# Patient Record
Sex: Female | Born: 1940 | Race: White | Hispanic: No | Marital: Single | Smoking: Never smoker
Health system: Southern US, Community
[De-identification: ages and names within clinical notes are randomized; demographics above are authoritative.]

## PROBLEM LIST (undated history)

## (undated) DIAGNOSIS — G4730 Sleep apnea, unspecified: Secondary | ICD-10-CM

## (undated) DIAGNOSIS — N189 Chronic kidney disease, unspecified: Secondary | ICD-10-CM

## (undated) DIAGNOSIS — E785 Hyperlipidemia, unspecified: Secondary | ICD-10-CM

## (undated) DIAGNOSIS — K219 Gastro-esophageal reflux disease without esophagitis: Secondary | ICD-10-CM

## (undated) DIAGNOSIS — C801 Malignant (primary) neoplasm, unspecified: Secondary | ICD-10-CM

## (undated) DIAGNOSIS — I639 Cerebral infarction, unspecified: Secondary | ICD-10-CM

## (undated) DIAGNOSIS — R7303 Prediabetes: Secondary | ICD-10-CM

## (undated) DIAGNOSIS — I10 Essential (primary) hypertension: Secondary | ICD-10-CM

## (undated) DIAGNOSIS — H269 Unspecified cataract: Secondary | ICD-10-CM

## (undated) DIAGNOSIS — T7840XA Allergy, unspecified, initial encounter: Secondary | ICD-10-CM

## (undated) HISTORY — DX: Unspecified cataract: H26.9

## (undated) HISTORY — DX: Hyperlipidemia, unspecified: E78.5

## (undated) HISTORY — DX: Malignant (primary) neoplasm, unspecified: C80.1

## (undated) HISTORY — PX: BREAST SURGERY: SHX581

## (undated) HISTORY — DX: Gastro-esophageal reflux disease without esophagitis: K21.9

## (undated) HISTORY — DX: Cerebral infarction, unspecified: I63.9

## (undated) HISTORY — PX: SPINE SURGERY: SHX786

## (undated) HISTORY — DX: Essential (primary) hypertension: I10

## (undated) HISTORY — DX: Allergy, unspecified, initial encounter: T78.40XA

## (undated) HISTORY — PX: DILATION AND CURETTAGE OF UTERUS: SHX78

---
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
## 1963-10-24 HISTORY — PX: OTHER SURGICAL HISTORY: SHX169

## 1997-10-23 HISTORY — PX: EYE SURGERY: SHX253

## 1998-05-04 ENCOUNTER — Other Ambulatory Visit: Admission: RE | Admit: 1998-05-04 | Discharge: 1998-05-04 | Payer: Self-pay | Admitting: Obstetrics and Gynecology

## 1998-05-11 ENCOUNTER — Other Ambulatory Visit: Admission: RE | Admit: 1998-05-11 | Discharge: 1998-05-11 | Payer: Self-pay | Admitting: Obstetrics and Gynecology

## 1999-05-06 ENCOUNTER — Other Ambulatory Visit: Admission: RE | Admit: 1999-05-06 | Discharge: 1999-05-06 | Payer: Self-pay | Admitting: Obstetrics and Gynecology

## 2000-05-09 ENCOUNTER — Other Ambulatory Visit: Admission: RE | Admit: 2000-05-09 | Discharge: 2000-05-09 | Payer: Self-pay | Admitting: Obstetrics and Gynecology

## 2000-11-05 ENCOUNTER — Encounter (INDEPENDENT_AMBULATORY_CARE_PROVIDER_SITE_OTHER): Payer: Self-pay

## 2000-11-05 ENCOUNTER — Ambulatory Visit (HOSPITAL_COMMUNITY): Admission: RE | Admit: 2000-11-05 | Discharge: 2000-11-05 | Payer: Self-pay | Admitting: Gastroenterology

## 2001-06-28 ENCOUNTER — Other Ambulatory Visit: Admission: RE | Admit: 2001-06-28 | Discharge: 2001-06-28 | Payer: Self-pay | Admitting: Obstetrics and Gynecology

## 2002-08-01 ENCOUNTER — Other Ambulatory Visit: Admission: RE | Admit: 2002-08-01 | Discharge: 2002-08-01 | Payer: Self-pay | Admitting: Obstetrics and Gynecology

## 2003-09-11 ENCOUNTER — Other Ambulatory Visit: Admission: RE | Admit: 2003-09-11 | Discharge: 2003-09-11 | Payer: Self-pay | Admitting: Obstetrics and Gynecology

## 2004-09-13 ENCOUNTER — Other Ambulatory Visit: Admission: RE | Admit: 2004-09-13 | Discharge: 2004-09-13 | Payer: Self-pay | Admitting: Obstetrics and Gynecology

## 2005-10-23 HISTORY — PX: BACK SURGERY: SHX140

## 2006-01-03 ENCOUNTER — Other Ambulatory Visit: Admission: RE | Admit: 2006-01-03 | Discharge: 2006-01-03 | Payer: Self-pay | Admitting: Obstetrics and Gynecology

## 2006-01-05 ENCOUNTER — Inpatient Hospital Stay (HOSPITAL_COMMUNITY): Admission: RE | Admit: 2006-01-05 | Discharge: 2006-01-06 | Payer: Self-pay | Admitting: Neurosurgery

## 2010-05-16 ENCOUNTER — Encounter: Admission: RE | Admit: 2010-05-16 | Discharge: 2010-07-22 | Payer: Self-pay | Admitting: Emergency Medicine

## 2011-03-10 NOTE — Procedures (Signed)
[facility]  Patient:    Tracey Richard, Tracey Richard              MRN: [id number] Proc. Date: 11/05/00 Adm. Date:  [id number] Attending:  Nelda Marseille CC:         Helene Kelp, M.D.  Daniel L. Eda Paschal, M.D.   Procedure Report  PROCEDURE:  Colonoscopy with biopsy.  INDICATIONS:  Patient with bright red per rectum, due for colonic screening. Also with some increased mucus of late after being started on a new nonsteroidal.  INFORMED CONSENT:  Consent was signed after risk, benefits, methods and options were thoroughly discussed in the office.  MEDICINES USED:  Demerol 60 mg, Versed 6 mg.  DESCRIPTION OF PROCEDURE:  Rectal inspection was pertinent for external hemorrhoids.  Digital exam was negative.  The video colonoscope was inserted. Some proximal rectal distal sigmoid inflammation was seen.  The scope was advanced around the colon to the cecum which did require rolling her on her back and some abdominal pressure.  No obvious other abnormalities were seen as we advanced to the cecum.  The cecum was pertinent for some periappendiceal inflammation which was cold biopsied.  The scope was inserted a short ways in the terminal ileum which was normal.  Photodocumentation was obtained.  The scope was slowly withdrawn.  Prep was fairly adequate.  It did require lots of washing and suctioning to clear lots of liquid stool but on slow withdrawal through the colon, the rest of the cecum, ascending, transverse, and descending were all normal.  In the distal sigmoid, a small questionable 1-2 mm polyp was seen just about where the inflammation and tiny erosions were seen.  Multiple biopsies were obtained.  No obvious mass, lesion, or significant ulceration was seen.  Once back in the rectum, the scope was retroflexed.  The distal rectum was pertinent for internal hemorrhoids, but being normal.  The proximal rectum was involved and inflamed, as above.   The scope was reinserted a short ways up the sigmoid.  Air was suctioned and the scope removed.  The patient tolerated the procedure well, and there was no obvious immediate complication.  ENDOSCOPIC DIAGNOSES: 1. Internal/external hemorrhoids. 2. Inflamed proximal rectum, distal sigmoid, status post biopsy, very short    segment. 3. One tiny distal sigmoid polyp, status post biopsy. 4. Periappendiceal inflammation, status post biopsy. 5. Otherwise within normal limits to the terminal ileum.  PLAN:  Await pathology to see if this is Crohns or a nonsteroidal cause. Will use suppositories with HC, hold Canasa suppositories for now because of her sulfa allergy.  Follow up p.r.n. or in 6-8 weeks to recheck symptoms and make sure no further work-up plans are needed. DD:  11/05/00 TD:  11/05/00 Job: [id number]

## 2011-03-10 NOTE — Op Note (Signed)
NAMEVICKEY, BOAK            ACCOUNT NO.:  [id number]   MEDICAL RECORD NO.:  [id number]          PATIENT TYPE:  INP   LOCATION:  2899                         FACILITY:  [facility]   PHYSICIAN:  Kathaleen Maser. Pool, M.D.    DATE OF BIRTH:  [birth year]   DATE OF PROCEDURE:  01/05/2006  DATE OF DISCHARGE:                                 OPERATIVE REPORT   SERVICE:  Neurosurgery.   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   PROCEDURE:  1.  L4-5 decompressive laminectomy with foraminotomies, more than would be      required for simple interbody fusion along.  2.  L4-5 posterolateral interbody fusion utilizing TANGENT allograft wedge,      Telamon PEEK interbody cage and local autografting.  3.  Posterolateral arthrodesis utilizing non-segmental pedicle screw      fixation and local autografting.   SURGEON:  Kathaleen Maser. Pool, M.D.   ASSISTANT:  Donalee Citrin, M.D.   ANESTHESIA:  General endotracheal anesthesia.   INDICATIONS:  Ms. Gatchell is a 70 year old female with a history of  severe back and lower extremities pain, failing conservative management.  Workup demonstrates evidence of a degenerative spondylolisthesis at L4-5  with coexistent marked lateral listhesis as well.  The patient has severe  stenosis at this level.  We have discussed options of her management  including the possibility of undergoing an L4-5 decompression and fusion and  instrument.  The patient is aware of the risks and benefits and wishes to  proceed.   OPERATIVE NOTE:  The patient was placed on the operating room table in a  supine position.  After an adequate level of anesthesia was achieved, the  patient was positioned prone onto a Wilson frame and appropriately padded.  The patient's lumbar region was prepped and draped sterilely.  A 10 blade  was used to make a linear skin incision overlying the  L3, 4 and 5 levels.  This was carried down sharply in the midline.  Subperiosteal dissection was  performed to expose the lamina and facet joints at L4 and L5.  Deep self-  retainers were placed.  Intraoperative fluoroscopy was used and level was  confirmed.  The transverse process at L4 and L5 were also dissected free.  A  decompressive laminectomy was then performed using Leksell rongeur, Kerrison  rongeurs and a high-speed drill to remove the entire lamina of L4, entire  inferior facet of L4, entire superior facet of L5 and the superior aspect of  the lamina of L5.  All bone was cleaned and used in later autografting.  The  ligamentum flavum was elevated and resected in piecemeal fashion using  Kerrison rongeurs.  On the thecal sac, existing L4 and L5 nerve root were  identified.  A wide decompressive foraminotomy was then performed, exposing  the existing nerve roots bilaterally.  The epidural venous plexus was  coagulated and cut.  The thecal sac and nerve roots were mobilized and  retracted towards the midline starting first on the patient's right side.  The disk space was  incised with a 15 blade in a triangular fashion and a  wide disk space cleanout was achieved using pituitary rongeurs, upbiting and  downbiting pituitary rongeurs and Epstein curettes.  The procedure was then  repeated on the contralateral side.  The disk space was then distracted up  to 11 mm with an 11-mm distractor left in the patient's left side.  The  thecal sac and nerve roots were protracted on the right side.  The disk  space was then reamed and then cut with 10-mm TANGENT instrument.  Soft  tissues were removed from the interspace.  A 10 x 26-mm Telamon cage packed  with autograft was then impacted into place and recessed approximately 2 mm  from the posterior cortical margin.  Distraction was to the patient's left  side.  The thecal sac and nerve roots were protected on the left side.  The  disk space was  then reamed and then cut with a 10-mm TANGENT instrument.  Soft tissues were removed from the interspace.  Morcelized autograft was  then packed into the interspace.  A 10 x 26-mm TANGENT allograft wedge was  then impacted into place and recessed approximately 2 mm posterior to the  cortical margin.  The pedicles at L4 and L5 were then identified superficial  line marks.  Superficial bone involving the pedicle was then removed using a  high-speed drill.  Each pedicle was then probed using a pedicle awl.  The  pedicle awl tract was then probed and found to solid in bone.  Each pedicle  awl tract was then tapped with a 5.25-mm screw tap.  Each screw tap hole was  probed and found to be solid within bone.  A 6.75 x 45-mm spiral 90-D screws  were placed bilaterally at L4 and 6.75 x 40-mm screws placed bilaterally at  L5.  The transverse processes at L4 and L5 were then decorticated using a  high-speed drill.  Morcelized allograft was packed posterolaterally for  later fusion.  Short-segment titanium rods were then placed to screw head at  L4 and L5.  Locking caps were then placed over the screw heads.  The locking  caps were then engaged with the construct under compression.  Final images  revealed good position of bone graft at the proper operative level and  normal alignment of the spine.  The wound was then irrigated with antibiotic  solution.  Gelfoam was placed topically and hemostasis found to be good.  Retraction system was removed.  Hemostasis in the muscle was achieved with  electrocautery.  The wound was then closed in layers with Vicryl sutures.  Steri-Strip series were applied.  There were no apparent complication.  The  patient was awakened and she was returned to the recovery room postop.           ______________________________  Kathaleen Maser. Pool, M.D.     HAP/MEDQ  D:  01/05/2006  T:  01/06/2006  Job:  [id number]

## 2011-05-19 ENCOUNTER — Ambulatory Visit (INDEPENDENT_AMBULATORY_CARE_PROVIDER_SITE_OTHER): Payer: Self-pay | Admitting: Surgery

## 2011-06-21 ENCOUNTER — Ambulatory Visit (INDEPENDENT_AMBULATORY_CARE_PROVIDER_SITE_OTHER): Payer: Medicare Other | Admitting: Surgery

## 2011-06-21 ENCOUNTER — Encounter (INDEPENDENT_AMBULATORY_CARE_PROVIDER_SITE_OTHER): Payer: Self-pay | Admitting: Surgery

## 2011-06-21 VITALS — BP 127/85 | HR 66

## 2011-06-21 DIAGNOSIS — N62 Hypertrophy of breast: Secondary | ICD-10-CM

## 2011-06-21 DIAGNOSIS — N6099 Unspecified benign mammary dysplasia of unspecified breast: Secondary | ICD-10-CM

## 2011-06-21 NOTE — Progress Notes (Signed)
Chief Complaint  Patient presents with  . Breast Cancer Long Term Follow Up    Tracey Richard is a 70 y.o. (DOB: 06/12/1941)  white female who is a patient of DAUB,STEVE A, MD and comes to me today for breast exam.  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 Judithe Modest. She was followed by Dr. Paulla Fore until his retirement and has since been seeing me. Her last visit to our office was October 2008. She was surprised as been that long.  She has a mammogram dated 16 Mar 2011 from [place] that was negative. She has noticed no new mass or lump in her breast. She retired about one year ago. She successfully lost about 30 pounds of weight.  She is helping her niece who is on hemodialysis.  PHYSICAL EXAM: BP 127/85  Pulse 66  HEENT:  Pupils equal.  Dentition good.  No injury. NECK:  Supple.  No thyroid mass. LYMPH NODES:  No cervical, supraclavicular, or axillary adenopathy. BREASTS -  RIGHT:  No palpable mass or nodule.  No nipple discharge.  Scar lateral right breast   LEFT:  No palpable mass or nodule.  No nipple discharge. UPPER EXTREMITIES:  No evidence of lymphedema.  DATA REVIEWED: [facility] mammogram 03/16/2011.  ASSESSMENT and PLAN: 1.  Atypical hyperplasia of right breast.  Now 27 years out from her original biopsy.  I discussed her followup with me.  She sees Dr. Ellis Parents for a primary care doctor.  She will follow with him for her breast care.  I made this her last visit with me unless she has a problem.

## 2011-06-21 NOTE — Patient Instructions (Signed)
Will follow Dr. Ellis Parents for your care.

## 2011-09-07 ENCOUNTER — Telehealth: Payer: Self-pay | Admitting: Family Medicine

## 2011-09-11 NOTE — Telephone Encounter (Signed)
closed

## 2012-01-16 ENCOUNTER — Ambulatory Visit: Payer: Self-pay | Admitting: Emergency Medicine

## 2012-01-19 ENCOUNTER — Ambulatory Visit (INDEPENDENT_AMBULATORY_CARE_PROVIDER_SITE_OTHER): Payer: Medicare Other | Admitting: Family Medicine

## 2012-01-19 DIAGNOSIS — I10 Essential (primary) hypertension: Secondary | ICD-10-CM

## 2012-01-19 DIAGNOSIS — J301 Allergic rhinitis due to pollen: Secondary | ICD-10-CM

## 2012-01-19 MED ORDER — VALSARTAN 80 MG PO TABS: 40.0000 mg | ORAL_TABLET | Freq: Every day | ORAL | Status: DC

## 2012-01-19 MED ORDER — FLUTICASONE PROPIONATE 50 MCG/ACT NA SUSP: 2.0000 | Freq: Every day | NASAL | Status: DC

## 2012-01-19 NOTE — Progress Notes (Signed)
  Patient Name: Tracey Richard Date of Birth: 25-Dec-1940 Medical Record Number: [id number] Gender: female Date of Encounter: 01/19/2012  History of Present Illness:  Tracey Richard is a 71 y.o. very pleasant female patient who presents with the following:  Has noted a scratchy throat for a couple of weeks- usually better with a cough drop and ibuprofen.  Ears feel itchy and "popping" for a week or so.  Also this am noted hoarse voice.  Some cough, mild runny nose.  No sneezing, no itchy or watery eyes.  She did have a temp of 100.6 last week- since then no fever.  However, one day last week and yesterday she felt nauseated and vomited after drinking her coffee.  No vomiting today, no diarrhea.  No unusual stomach pain- she does have a history of reflux and a hiatal hernia.    Patient Active Problem List  Diagnoses  . Atypical hyperplasia of breast, right   Past Medical History  Diagnosis Date  . Cancer   . GERD (gastroesophageal reflux disease)   . Hyperlipidemia   . Hypertension    Past Surgical History  Procedure Date  . Tonsillectomy   . Back surgery   . Appendectomy   . Moles   . Breast surgery    History  Substance Use Topics  . Smoking status: Never Smoker   . Smokeless tobacco: Never Used  . Alcohol Use: No   No family history on file. Allergies  Allergen Reactions  . Penicillins   . Sulfur     Medication list has been reviewed and updated.  Review of Systems: As per HPI- otherwise negative.   Physical Examination: Filed Vitals:   01/19/12 1549  BP: 113/68  Pulse: 85  Temp: 98.5 F (36.9 C)  TempSrc: Oral  Resp: 16  Height: 5\' 10"  (1.778 m)  Weight: 156 lb 6.4 oz (70.943 kg)    Body mass index is 22.44 kg/(m^2).  GEN: WDWN, NAD, Non-toxic, A & O x 3 HEENT: Atraumatic, Normocephalic. Neck supple. No masses, No LAD.   Tm, oropharynx wnl.  Nasal cavity consistent with AR Ears and Nose: No external deformity. CV: RRR, No M/G/R. No JVD. No  thrill. No extra heart sounds. PULM: CTA B, no wheezes, crackles, rhonchi. No retractions. No resp. distress. No accessory muscle use. ABD: S, NT, ND, +BS. No rebound. No HSM. EXTR: No c/c/e NEURO Normal gait.  PSYCH: Normally interactive. Conversant. Not depressed or anxious appearing.  Calm demeanor.    Assessment and Plan: 1. Allergic rhinitis  fluticasone (FLONASE) 50 MCG/ACT nasal spray  2. Hypertension  valsartan (DIOVAN) 80 MG tablet   Refilled htn meds.  Suspect symptoms due to allergies- try flonase as above.  If not better please call

## 2012-02-20 ENCOUNTER — Other Ambulatory Visit: Payer: Self-pay

## 2012-02-20 ENCOUNTER — Encounter: Payer: Self-pay | Admitting: Emergency Medicine

## 2012-02-20 ENCOUNTER — Ambulatory Visit: Payer: Medicare Other

## 2012-02-20 ENCOUNTER — Ambulatory Visit (INDEPENDENT_AMBULATORY_CARE_PROVIDER_SITE_OTHER): Payer: Medicare Other | Admitting: Emergency Medicine

## 2012-02-20 VITALS — BP 122/77 | HR 78 | Temp 98.6°F | Resp 16 | Ht 63.5 in | Wt 158.0 lb

## 2012-02-20 DIAGNOSIS — M545 Low back pain: Secondary | ICD-10-CM

## 2012-02-20 DIAGNOSIS — R1084 Generalized abdominal pain: Secondary | ICD-10-CM

## 2012-02-20 DIAGNOSIS — R319 Hematuria, unspecified: Secondary | ICD-10-CM

## 2012-02-20 DIAGNOSIS — M549 Dorsalgia, unspecified: Secondary | ICD-10-CM

## 2012-02-20 DIAGNOSIS — Z0000 Encounter for general adult medical examination without abnormal findings: Secondary | ICD-10-CM

## 2012-02-20 LAB — CBC WITH DIFFERENTIAL/PLATELET
Basophils Absolute: 0 10*3/uL (ref 0.0–0.1)
Basophils Relative: 0 % (ref 0–1)
Eosinophils Absolute: 0.2 10*3/uL (ref 0.0–0.7)
Eosinophils Relative: 3 % (ref 0–5)
Hemoglobin: 12.4 g/dL (ref 12.0–15.0)
Lymphocytes Relative: 27 % (ref 12–46)
Lymphs Abs: 1.9 10*3/uL (ref 0.7–4.0)
MCHC: 32.5 g/dL (ref 30.0–36.0)
Monocytes Relative: 9 % (ref 3–12)
WBC: 7.2 10*3/uL (ref 4.0–10.5)

## 2012-02-20 LAB — COMPREHENSIVE METABOLIC PANEL
Alkaline Phosphatase: 59 U/L (ref 39–117)
Chloride: 106 mEq/L (ref 96–112)
Creat: 0.95 mg/dL (ref 0.50–1.10)
Glucose, Bld: 84 mg/dL (ref 70–99)
Sodium: 142 mEq/L (ref 135–145)
Total Protein: 6.5 g/dL (ref 6.0–8.3)

## 2012-02-20 LAB — POCT UA - MICROSCOPIC ONLY: Casts, Ur, LPF, POC: NEGATIVE

## 2012-02-20 LAB — LIPID PANEL
Cholesterol: 171 mg/dL (ref 0–200)
HDL: 52 mg/dL (ref >39)
LDL Cholesterol: 105 mg/dL — ABNORMAL HIGH (ref 0–99)
VLDL: 14 mg/dL (ref 0–40)

## 2012-02-20 LAB — POCT URINALYSIS DIPSTICK
Leukocytes, UA: NEGATIVE
Protein, UA: NEGATIVE

## 2012-02-20 LAB — TSH: TSH: 1.995 u[IU]/mL (ref 0.350–4.500)

## 2012-02-20 NOTE — Progress Notes (Signed)
  Subjective:    Patient ID: Tracey Richard, female    DOB: [birth year], 71 y.o.   MRN: [id number]  HPI patient here for her yearly physical exam. She is followed here for high cholesterol and high blood pressure. She is doing very well she has minimal complaints at this time.    Review of Systems  Constitutional: Negative.   HENT: Negative.   Eyes: Negative.   Respiratory: Negative.   Cardiovascular: Negative.   Gastrointestinal:       She has made an appointment to see Dr. Ewing Schlein for a recent change in bowel habits.  Genitourinary:       History microscopic hematuria. She's been to Dr. Lorretta Harp in the past but not recently. She has not noticed any symptoms .  Musculoskeletal:       Patient is status post back surgery she has chronic intermittent mid back pain  Neurological: Negative.   Hematological: Negative.   Psychiatric/Behavioral: Negative.        Objective:   Physical Exam  Constitutional: She is oriented to person, place, and time. She appears well-developed and well-nourished.  HENT:  Head: Normocephalic and atraumatic.  Eyes: Pupils are equal, round, and reactive to light.  Neck: Normal range of motion. No JVD present. No tracheal deviation present. No thyromegaly present.  Cardiovascular: Normal rate, regular rhythm and intact distal pulses.  Exam reveals no gallop and no friction rub.   No murmur heard. Pulmonary/Chest: Effort normal and breath sounds normal. No respiratory distress. She has no wheezes. She has no rales. She exhibits no tenderness.  Abdominal: Soft. She exhibits no distension and no mass. There is no tenderness. There is no rebound and no guarding.  Lymphadenopathy:    She has no cervical adenopathy.  Neurological: She is alert and oriented to person, place, and time. No cranial nerve deficit.  Skin: Skin is warm and dry.  Psychiatric: She has a normal mood and affect. Her behavior is normal.  [facility] reading (PRIMARY) by  Dr.Reyana Leisey back films show  hardware present. There appears to be a spondylolisthesis above the hardware placement . Results for orders placed in visit on 02/20/12  POCT UA - MICROSCOPIC ONLY      Component Value Range   WBC, Ur, HPF, POC 0-1     RBC, urine, microscopic 2-8     Bacteria, U Microscopic neg     Mucus, UA trace     Epithelial cells, urine per micros 0-3     Crystals, Ur, HPF, POC neg     Casts, Ur, LPF, POC neg     Yeast, UA neg    POCT URINALYSIS DIPSTICK      Component Value Range   Color, UA yellow     Clarity, UA clear     Glucose, UA neg     Bilirubin, UA small     Ketones, UA trace     Spec Grav, UA 1.025     Blood, UA moderate     pH, UA 5.5     Protein, UA neg     Urobilinogen, UA 0.2     Nitrite, UA neg     Leukocytes, UA Negative    .  EKG is normal        Assessment & Plan:  No change in medications at present. Referral made to urology for evaluation.

## 2012-02-24 ENCOUNTER — Telehealth: Payer: Self-pay

## 2012-02-24 NOTE — Telephone Encounter (Signed)
Pt would like to know lab results

## 2012-02-26 ENCOUNTER — Telehealth: Payer: Self-pay

## 2012-02-26 NOTE — Telephone Encounter (Signed)
Pt is looking for lab results  Please call [phone]

## 2012-02-27 NOTE — Telephone Encounter (Signed)
NOTIFIED PT OF NORMAL LABS.

## 2012-05-14 ENCOUNTER — Encounter: Payer: Self-pay | Admitting: Emergency Medicine

## 2012-06-14 ENCOUNTER — Encounter: Payer: Self-pay | Admitting: Emergency Medicine

## 2012-06-18 ENCOUNTER — Ambulatory Visit: Payer: Medicare Other

## 2012-06-18 ENCOUNTER — Ambulatory Visit (INDEPENDENT_AMBULATORY_CARE_PROVIDER_SITE_OTHER): Payer: Medicare Other | Admitting: Emergency Medicine

## 2012-06-18 VITALS — BP 141/81 | HR 73 | Temp 97.8°F | Resp 18 | Ht 63.5 in | Wt 159.0 lb

## 2012-06-18 DIAGNOSIS — Z9109 Other allergy status, other than to drugs and biological substances: Secondary | ICD-10-CM

## 2012-06-18 DIAGNOSIS — G4700 Insomnia, unspecified: Secondary | ICD-10-CM

## 2012-06-18 DIAGNOSIS — R7309 Other abnormal glucose: Secondary | ICD-10-CM

## 2012-06-18 DIAGNOSIS — R319 Hematuria, unspecified: Secondary | ICD-10-CM

## 2012-06-18 DIAGNOSIS — R6889 Other general symptoms and signs: Secondary | ICD-10-CM

## 2012-06-18 DIAGNOSIS — I10 Essential (primary) hypertension: Secondary | ICD-10-CM

## 2012-06-18 DIAGNOSIS — E119 Type 2 diabetes mellitus without complications: Secondary | ICD-10-CM

## 2012-06-18 DIAGNOSIS — R9389 Abnormal findings on diagnostic imaging of other specified body structures: Secondary | ICD-10-CM

## 2012-06-18 LAB — POCT GLYCOSYLATED HEMOGLOBIN (HGB A1C): Hemoglobin A1C: 5.9

## 2012-06-18 LAB — GLUCOSE, POCT (MANUAL RESULT ENTRY): POC Glucose: 96 mg/dl (ref 70–99)

## 2012-06-18 MED ORDER — IMIPRAMINE PAMOATE 75 MG PO CAPS: 75.0000 mg | ORAL_CAPSULE | Freq: Every day | ORAL | Status: DC

## 2012-06-18 MED ORDER — VALSARTAN 80 MG PO TABS: ORAL_TABLET | ORAL | Status: DC

## 2012-06-18 MED ORDER — OMEPRAZOLE 40 MG PO CPDR: 40.0000 mg | DELAYED_RELEASE_CAPSULE | Freq: Every day | ORAL | Status: DC

## 2012-06-18 MED ORDER — FLUTICASONE PROPIONATE 50 MCG/ACT NA SUSP: 2.0000 | Freq: Every day | NASAL | Status: DC

## 2012-06-18 NOTE — Progress Notes (Signed)
  Subjective:    Patient ID: Tracey Richard, female    DOB: [birth year], 71 y.o.   MRN: [id number]  HPI [name] is in the followup of her hematuria. She went to the nephrologist and urologist. She is due to have a followup ultrasound in a year. She is due to see Dr. Hyman Hopes in a year. She was found to have 3 mm nodule in the right middle lobe and was advised to have this followed up.    Review of Systems     Objective:   Physical Exam patient is alert and cooperative not in any distress. Her neck is supple. Her chest is clear. Cardiac exam is unremarkable    [facility] reading (PRIMARY) by  Dr. Cleta Alberts there is elevation the right hemidiaphragm no other suspicious lesions seen. There are tiny nodular areas all less than then a half centimeters seen in the lower quadrants bilaterally.      Assessment & Plan:  Plan recheck in about 5 months in plan repeat CT at that time . I told her that if the radiologist had a different opinion regarding the chest x-ray she had today we would proceed with evaluation sooner

## 2012-06-19 LAB — COMPREHENSIVE METABOLIC PANEL
AST: 19 U/L (ref 0–37)
Alkaline Phosphatase: 54 U/L (ref 39–117)
Calcium: 9.5 mg/dL (ref 8.4–10.5)
Creat: 0.88 mg/dL (ref 0.50–1.10)
Total Bilirubin: 0.5 mg/dL (ref 0.3–1.2)
Total Protein: 6.6 g/dL (ref 6.0–8.3)

## 2012-06-19 LAB — LIPID PANEL
Cholesterol: 206 mg/dL — ABNORMAL HIGH (ref 0–200)
HDL: 52 mg/dL (ref >39)
LDL Cholesterol: 138 mg/dL — ABNORMAL HIGH (ref 0–99)

## 2012-11-15 ENCOUNTER — Telehealth: Payer: Self-pay | Admitting: Radiology

## 2012-11-15 NOTE — Telephone Encounter (Signed)
Patient wants to use the Imipramine 25mg , she takes 3 at bedtime [employer]. The last Rx was for extended release.

## 2012-11-19 ENCOUNTER — Ambulatory Visit (INDEPENDENT_AMBULATORY_CARE_PROVIDER_SITE_OTHER): Payer: Medicare Other | Admitting: Emergency Medicine

## 2012-11-19 ENCOUNTER — Encounter: Payer: Self-pay | Admitting: Emergency Medicine

## 2012-11-19 ENCOUNTER — Telehealth: Payer: Self-pay | Admitting: Radiology

## 2012-11-19 VITALS — BP 125/74 | HR 74 | Temp 97.6°F | Resp 16 | Ht 63.0 in | Wt 162.0 lb

## 2012-11-19 DIAGNOSIS — E782 Mixed hyperlipidemia: Secondary | ICD-10-CM

## 2012-11-19 DIAGNOSIS — G4700 Insomnia, unspecified: Secondary | ICD-10-CM

## 2012-11-19 DIAGNOSIS — B009 Herpesviral infection, unspecified: Secondary | ICD-10-CM

## 2012-11-19 DIAGNOSIS — E119 Type 2 diabetes mellitus without complications: Secondary | ICD-10-CM

## 2012-11-19 DIAGNOSIS — I10 Essential (primary) hypertension: Secondary | ICD-10-CM

## 2012-11-19 DIAGNOSIS — E7800 Pure hypercholesterolemia, unspecified: Secondary | ICD-10-CM

## 2012-11-19 LAB — LIPID PANEL
Cholesterol: 225 mg/dL — ABNORMAL HIGH (ref 0–200)
LDL Cholesterol: 164 mg/dL — ABNORMAL HIGH (ref 0–99)
Total CHOL/HDL Ratio: 4.7 Ratio
VLDL: 13 mg/dL (ref 0–40)

## 2012-11-19 LAB — CBC WITH DIFFERENTIAL/PLATELET
Basophils Absolute: 0 10*3/uL (ref 0.0–0.1)
Basophils Relative: 1 % (ref 0–1)
Eosinophils Absolute: 0.2 10*3/uL (ref 0.0–0.7)
Eosinophils Relative: 3 % (ref 0–5)
Hemoglobin: 12.2 g/dL (ref 12.0–15.0)
MCHC: 34.2 g/dL (ref 30.0–36.0)
Monocytes Relative: 11 % (ref 3–12)
Neutro Abs: 3.7 10*3/uL (ref 1.7–7.7)
Neutrophils Relative %: 59 % (ref 43–77)
RBC: 4.2 MIL/uL (ref 3.87–5.11)
RDW: 13.9 % (ref 11.5–15.5)

## 2012-11-19 LAB — COMPREHENSIVE METABOLIC PANEL
ALT: 12 U/L (ref 0–35)
Calcium: 9.2 mg/dL (ref 8.4–10.5)
Chloride: 106 mEq/L (ref 96–112)
Potassium: 4 mEq/L (ref 3.5–5.3)
Sodium: 143 mEq/L (ref 135–145)
Total Bilirubin: 0.3 mg/dL (ref 0.3–1.2)

## 2012-11-19 LAB — GLUCOSE, POCT (MANUAL RESULT ENTRY): POC Glucose: 96 mg/dl (ref 70–99)

## 2012-11-19 MED ORDER — VALACYCLOVIR HCL 1 G PO TABS: ORAL_TABLET | ORAL | Status: DC

## 2012-11-19 MED ORDER — IMIPRAMINE HCL 25 MG PO TABS: 75.0000 mg | ORAL_TABLET | Freq: Every day | ORAL | Status: DC

## 2012-11-19 NOTE — Progress Notes (Signed)
  Subjective:    Patient ID: Tracey Richard, female    DOB: 10-Feb-1941, 72 y.o.   MRN: [id number]  HPI patient in for recheck. She's doing well. She is requesting a refill on her imipramine. She is shingles eruption on her lip and 2 small blisters on her chest. Sugars have been running in the low 100s in the morning. She denies chest pain shortness of breath or any other problems at the present time.    Review of Systems     Objective:   Physical Exam patient is alert and cooperative in no distress. Her neck is supple chest clear to auscultation and percussion feet show no ulcerations pulses 2+ and symmetrical there is no edema. Cardiac exam is regular rate without murmurs rubs or gallops  Results for orders placed in visit on 11/19/12  GLUCOSE, POCT (MANUAL RESULT ENTRY)      Component Value Range   POC Glucose 96  70 - 99 mg/dl  POCT GLYCOSYLATED HEMOGLOBIN (HGB A1C)      Component Value Range   Hemoglobin A1C 5.7          Assessment & Plan:  Patient stable at present we'll check baseline labs. She's been very compliant with her diet. Weight today is 162

## 2012-11-19 NOTE — Telephone Encounter (Signed)
Error

## 2012-11-26 MED ORDER — ROSUVASTATIN CALCIUM 10 MG PO TABS: 10.0000 mg | ORAL_TABLET | Freq: Every day | ORAL | Status: DC

## 2012-11-26 NOTE — Addendum Note (Signed)
Addended by: Johnnette Litter on: 11/26/2012 02:43 PM   Modules accepted: Orders

## 2012-12-04 ENCOUNTER — Telehealth: Payer: Self-pay

## 2012-12-04 NOTE — Telephone Encounter (Signed)
Completed prior auth over the phone for pt's imipramine 25 mg for insomnia and received approval through 12/04/13. Notified pt that was approved and she thanked [place].

## 2013-01-08 ENCOUNTER — Ambulatory Visit: Payer: Medicare Other

## 2013-01-08 ENCOUNTER — Ambulatory Visit (INDEPENDENT_AMBULATORY_CARE_PROVIDER_SITE_OTHER): Payer: Medicare Other | Admitting: Family Medicine

## 2013-01-08 VITALS — BP 142/84 | HR 102 | Temp 99.4°F | Resp 18 | Ht 63.5 in | Wt 166.8 lb

## 2013-01-08 DIAGNOSIS — R05 Cough: Secondary | ICD-10-CM

## 2013-01-08 DIAGNOSIS — R059 Cough, unspecified: Secondary | ICD-10-CM

## 2013-01-08 DIAGNOSIS — G44229 Chronic tension-type headache, not intractable: Secondary | ICD-10-CM

## 2013-01-08 DIAGNOSIS — R509 Fever, unspecified: Secondary | ICD-10-CM

## 2013-01-08 DIAGNOSIS — J189 Pneumonia, unspecified organism: Secondary | ICD-10-CM

## 2013-01-08 LAB — POCT CBC
Granulocyte percent: 69.9 %G (ref 37–80)
Hemoglobin: 12.9 g/dL (ref 12.2–16.2)
MCH, POC: 28.7 pg (ref 27–31.2)
MCHC: 31.6 g/dL — AB (ref 31.8–35.4)
MID (cbc): 0.9 (ref 0–0.9)
MPV: 8.9 fL (ref 0–99.8)
POC MID %: 12.4 %M — AB (ref 0–12)
RBC: 4.49 M/uL (ref 4.04–5.48)
RDW, POC: 14.6 %

## 2013-01-08 MED ORDER — AZITHROMYCIN 250 MG PO TABS: ORAL_TABLET | ORAL | Status: DC

## 2013-01-08 MED ORDER — BENZONATATE 100 MG PO CAPS: ORAL_CAPSULE | ORAL | Status: DC

## 2013-01-08 MED ORDER — TRAMADOL HCL 50 MG PO TABS: 50.0000 mg | ORAL_TABLET | Freq: Three times a day (TID) | ORAL | Status: DC | PRN

## 2013-01-08 NOTE — Progress Notes (Signed)
Subjective: Patient has had several days of a bad cough and some fever. She just feels lousy. She has a history of allergies. She's been concerned about some mold outside of her door and wondered about that.  Objective: She is lying on exam table. Coughs a lot. Just is not feeling well. Low temperatures noted. TMs normal. Throat clear. Neck supple without significant nodes. She says she's been having bad headaches it's been sick. Chest has a few rhonchi on the left side. Heart regular without murmurs.  [facility] reading (PRIMARY) by  Dr. Alwyn Ren Right lower lobe pneumonia along the diaphragmatic border   Results for orders placed in visit on 01/08/13  POCT CBC      Result Value Range   WBC 6.9  4.6 - 10.2 K/uL   Lymph, poc 1.2  0.6 - 3.4   POC LYMPH PERCENT 17.7  10 - 50 %L   MID (cbc) 0.9  0 - 0.9   POC MID % 12.4 (*) 0 - 12 %M   POC Granulocyte 4.8  2 - 6.9   Granulocyte percent 69.9  37 - 80 %G   RBC 4.49  4.04 - 5.48 M/uL   Hemoglobin 12.9  12.2 - 16.2 g/dL   HCT, POC 96.0  45.4 - 47.9 %   MCV 90.9  80 - 97 fL   MCH, POC 28.7  27 - 31.2 pg   MCHC 31.6 (*) 31.8 - 35.4 g/dL   RDW, POC 09.8     Platelet Count, POC 300  142 - 424 K/uL   MPV 8.9  0 - 99.8 fL   Assessment: Right lower lobe pneumonia  Plan: Zithromax Tessalon Return if in all worse

## 2013-01-08 NOTE — Patient Instructions (Signed)
Plenty of fluids  Get enough rest  Return if in all worse, or if not improving over the next few days.

## 2013-01-13 ENCOUNTER — Ambulatory Visit (INDEPENDENT_AMBULATORY_CARE_PROVIDER_SITE_OTHER): Payer: Medicare Other | Admitting: Internal Medicine

## 2013-01-13 VITALS — BP 134/68 | HR 85 | Temp 98.7°F | Resp 16 | Ht 63.0 in | Wt 163.6 lb

## 2013-01-13 DIAGNOSIS — R05 Cough: Secondary | ICD-10-CM

## 2013-01-13 DIAGNOSIS — J329 Chronic sinusitis, unspecified: Secondary | ICD-10-CM

## 2013-01-13 MED ORDER — HYDROCODONE-HOMATROPINE 5-1.5 MG/5ML PO SYRP: 5.0000 mL | ORAL_SOLUTION | Freq: Four times a day (QID) | ORAL | Status: DC | PRN

## 2013-01-13 MED ORDER — MOXIFLOXACIN HCL 400 MG PO TABS: 400.0000 mg | ORAL_TABLET | Freq: Every day | ORAL | Status: DC

## 2013-01-13 NOTE — Progress Notes (Signed)
  Subjective:    Patient ID: Tracey Richard, female    DOB: February 10, 1941, 72 y.o.   MRN: [id number]  HPI At last visit 01/08/2013 was put on Zithromax and Tessalon for right lower lobe pneumonia She took her last dose yesterday She has had fever to 101 during this interim although no fever today She continues with a painful cough that is slightly productive and has now gotten marked sinus congestion with purulent sinus discharge especially in the morning Tessalon is not controlling her cough No wheezing   Review of Systems     Objective:   Physical Exam Vital signs stable TMs clear Nares boggy with purulent discharge Throat clear No nodes chest with rales and rhonchi at the right base posteriorly No wheezing with forced expiration Heart regular with a rate of 70        Assessment & Plan  Partially treated pneumonia with sinusitis  Meds ordered this encounter  Medications  . moxifloxacin (AVELOX) 400 MG tablet    Sig: Take 1 tablet (400 mg total) by mouth daily.    Dispense:  7 tablet    Refill:  0  . HYDROcodone-homatropine (HYCODAN) 5-1.5 MG/5ML syrup    Sig: Take 5 mLs by mouth every 6 (six) hours as needed for cough.    Dispense:  120 mL    Refill:  0   Followup 5-7 days

## 2013-02-18 ENCOUNTER — Ambulatory Visit (INDEPENDENT_AMBULATORY_CARE_PROVIDER_SITE_OTHER): Payer: Medicare Other | Admitting: Emergency Medicine

## 2013-02-18 ENCOUNTER — Encounter: Payer: Self-pay | Admitting: Emergency Medicine

## 2013-02-18 VITALS — BP 154/84 | HR 80 | Temp 98.2°F | Resp 16 | Ht 63.5 in | Wt 161.4 lb

## 2013-02-18 DIAGNOSIS — I10 Essential (primary) hypertension: Secondary | ICD-10-CM

## 2013-02-18 DIAGNOSIS — E119 Type 2 diabetes mellitus without complications: Secondary | ICD-10-CM

## 2013-02-18 LAB — POCT GLYCOSYLATED HEMOGLOBIN (HGB A1C): Hemoglobin A1C: 5.9

## 2013-02-18 NOTE — Progress Notes (Signed)
  Subjective:    Patient ID: Tracey Richard, female    DOB: May 30, 1941, 72 y.o.   MRN: [id number]  HPI patient here to followup hypertension. High cholesterol and diabetes. Her sugars at home have been around 100. She was seen here last month and treated for suspected right lower lobe pneumonia followed by sinusitis. She was treated with Zithromax and subsequently cleared with Avelox .    Review of Systems     Objective:   Physical Exam patient is alert and cooperative she is not ill-appearing. Her neck is supple. Her chest is clear to auscultation and percussion. Her heart exam is normal without rubs or gallops. Extremities are without edema.  Results for orders placed in visit on 02/18/13  GLUCOSE, POCT (MANUAL RESULT ENTRY)      Result Value Range   POC Glucose 115 (*) 70 - 99 mg/dl        Assessment & Plan:    No change in meds

## 2013-03-27 ENCOUNTER — Telehealth: Payer: Self-pay | Admitting: Radiology

## 2013-03-27 NOTE — Telephone Encounter (Signed)
Patient advised bone density study unchanged continue calcium and vit d

## 2013-04-18 ENCOUNTER — Encounter: Payer: Self-pay | Admitting: Emergency Medicine

## 2013-05-27 ENCOUNTER — Ambulatory Visit (INDEPENDENT_AMBULATORY_CARE_PROVIDER_SITE_OTHER): Payer: Medicare Other | Admitting: Emergency Medicine

## 2013-05-27 ENCOUNTER — Encounter: Payer: Self-pay | Admitting: Emergency Medicine

## 2013-05-27 VITALS — BP 110/64 | HR 72 | Temp 98.9°F | Resp 16 | Ht 64.5 in | Wt 160.8 lb

## 2013-05-27 DIAGNOSIS — R319 Hematuria, unspecified: Secondary | ICD-10-CM

## 2013-05-27 DIAGNOSIS — R109 Unspecified abdominal pain: Secondary | ICD-10-CM

## 2013-05-27 DIAGNOSIS — E119 Type 2 diabetes mellitus without complications: Secondary | ICD-10-CM

## 2013-05-27 NOTE — Progress Notes (Signed)
  Subjective:    Patient ID: Tracey Richard, female    DOB: May 09, 1941, 72 y.o.   MRN: [id number]  HPI 72 year old here for a recheck and get some blood work.  Doing good.  Has seen a ENT for the fluid in her ear.  She has a return appointment to check on that.  Seen Dr. Ezzard Standing.  Still walking.      Review of Systems     Objective:   Physical Exam H. EENT exam is unremarkable. Her neck is supple. Her chest is clear to auscultation and percussion. Heart regular rate no murmurs. Extremities are without edema. There is a healed perforation involving the right TM.  Results for orders placed in visit on 05/27/13  GLUCOSE, POCT (MANUAL RESULT ENTRY)      Result Value Range   POC Glucose 93  70 - 99 mg/dl   Results for orders placed in visit on 05/27/13  GLUCOSE, POCT (MANUAL RESULT ENTRY)      Result Value Range   POC Glucose 93  70 - 99 mg/dl  POCT GLYCOSYLATED HEMOGLOBIN (HGB A1C)      Result Value Range   Hemoglobin A1C 5.7         Assessment & Plan:  Patient is doing great. Her hemoglobin A1c is 5.7 which is normal . Recheck 4 months for a physical at that time.

## 2013-06-03 ENCOUNTER — Encounter: Payer: Self-pay | Admitting: Emergency Medicine

## 2013-07-17 ENCOUNTER — Telehealth: Payer: Self-pay

## 2013-07-17 NOTE — Telephone Encounter (Signed)
One of the office visits, from Aug 2013 indicates d/c Diovan 80, sig for med states 1/2 pill take as directed, is she to take this, if so, let me know so I can get sent in. I will send at 40 mg dose, if this is correct, please advise.

## 2013-07-17 NOTE — Telephone Encounter (Signed)
PT REQUESTING HER REFILLS FOR DIODAN, AND OMEPRAZOLE THRU [facility]   BEST PHONE FOR PT [phone]

## 2013-07-18 MED ORDER — VALSARTAN 40 MG PO TABS: 40.0000 mg | ORAL_TABLET | Freq: Every day | ORAL | Status: DC

## 2013-07-18 MED ORDER — OMEPRAZOLE 40 MG PO CPDR: 40.0000 mg | DELAYED_RELEASE_CAPSULE | Freq: Every day | ORAL | Status: DC

## 2013-07-18 NOTE — Telephone Encounter (Signed)
Yes she needs to be on Diovan 40 mg a day.

## 2013-07-18 NOTE — Telephone Encounter (Signed)
Sent in

## 2013-09-30 ENCOUNTER — Encounter: Payer: Self-pay | Admitting: Emergency Medicine

## 2013-09-30 ENCOUNTER — Other Ambulatory Visit: Payer: Self-pay | Admitting: Emergency Medicine

## 2013-09-30 ENCOUNTER — Ambulatory Visit (INDEPENDENT_AMBULATORY_CARE_PROVIDER_SITE_OTHER): Payer: Medicare Other | Admitting: Emergency Medicine

## 2013-09-30 VITALS — BP 140/82 | HR 66 | Temp 98.2°F | Resp 16 | Ht 63.0 in | Wt 163.8 lb

## 2013-09-30 DIAGNOSIS — R319 Hematuria, unspecified: Secondary | ICD-10-CM

## 2013-09-30 DIAGNOSIS — R030 Elevated blood-pressure reading, without diagnosis of hypertension: Secondary | ICD-10-CM

## 2013-09-30 DIAGNOSIS — E119 Type 2 diabetes mellitus without complications: Secondary | ICD-10-CM

## 2013-09-30 DIAGNOSIS — Z139 Encounter for screening, unspecified: Secondary | ICD-10-CM

## 2013-09-30 DIAGNOSIS — Z0000 Encounter for general adult medical examination without abnormal findings: Secondary | ICD-10-CM

## 2013-09-30 LAB — LIPID PANEL
Cholesterol: 251 mg/dL — ABNORMAL HIGH (ref 0–200)
HDL: 47 mg/dL (ref >39)
LDL Cholesterol: 183 mg/dL — ABNORMAL HIGH (ref 0–99)
Triglycerides: 104 mg/dL (ref <150)

## 2013-09-30 LAB — CBC WITH DIFFERENTIAL/PLATELET
Basophils Absolute: 0 10*3/uL (ref 0.0–0.1)
Basophils Relative: 1 % (ref 0–1)
Eosinophils Absolute: 0.2 10*3/uL (ref 0.0–0.7)
Eosinophils Relative: 3 % (ref 0–5)
Hemoglobin: 12.5 g/dL (ref 12.0–15.0)
Lymphs Abs: 2.2 10*3/uL (ref 0.7–4.0)
MCH: 28.9 pg (ref 26.0–34.0)
MCHC: 33.1 g/dL (ref 30.0–36.0)
MCV: 87.3 fL (ref 78.0–100.0)
Monocytes Absolute: 0.7 10*3/uL (ref 0.1–1.0)
Monocytes Relative: 10 % (ref 3–12)
Neutrophils Relative %: 57 % (ref 43–77)
RDW: 13.6 % (ref 11.5–15.5)

## 2013-09-30 LAB — COMPLETE METABOLIC PANEL WITH GFR
Alkaline Phosphatase: 57 U/L (ref 39–117)
BUN: 13 mg/dL (ref 6–23)
CO2: 28 mEq/L (ref 19–32)
Calcium: 9.4 mg/dL (ref 8.4–10.5)
Creat: 0.88 mg/dL (ref 0.50–1.10)
GFR, Est African American: 77 mL/min
GFR, Est Non African American: 67 mL/min
Glucose, Bld: 93 mg/dL (ref 70–99)
Sodium: 141 mEq/L (ref 135–145)
Total Bilirubin: 0.3 mg/dL (ref 0.3–1.2)

## 2013-09-30 LAB — POCT URINALYSIS DIPSTICK
Bilirubin, UA: NEGATIVE
Glucose, UA: NEGATIVE
Ketones, UA: NEGATIVE
Leukocytes, UA: NEGATIVE
Nitrite, UA: NEGATIVE
Protein, UA: NEGATIVE
Spec Grav, UA: 1.015

## 2013-09-30 LAB — POCT UA - MICROSCOPIC ONLY
Bacteria, U Microscopic: NEGATIVE
WBC, Ur, HPF, POC: NEGATIVE

## 2013-09-30 LAB — IFOBT (OCCULT BLOOD): IFOBT: NEGATIVE

## 2013-09-30 LAB — POCT GLYCOSYLATED HEMOGLOBIN (HGB A1C): Hemoglobin A1C: 5.5

## 2013-09-30 LAB — GLUCOSE, POCT (MANUAL RESULT ENTRY): POC Glucose: 90 mg/dl (ref 70–99)

## 2013-09-30 MED ORDER — BETAMETHASONE DIPROPIONATE AUG 0.05 % EX CREA: TOPICAL_CREAM | Freq: Two times a day (BID) | CUTANEOUS | Status: DC

## 2013-09-30 NOTE — Patient Instructions (Addendum)
Take Zyrtec 10 mg one a day for your itching and rash. Use cream as instructed .

## 2013-09-30 NOTE — Progress Notes (Signed)
   Subjective:    Patient ID: Tracey Richard, female    DOB: 30-Nov-1940, 72 y.o.   MRN: [id number]  HPI    Review of Systems  Constitutional: Negative.   HENT: Positive for hearing loss.   Eyes: Positive for itching.  Respiratory: Positive for shortness of breath.   Cardiovascular: Negative.   Gastrointestinal: Negative.   Endocrine: Negative.   Genitourinary: Negative.   Musculoskeletal: Negative.   Skin: Negative.   Allergic/Immunologic: Negative.   Neurological: Negative.   Hematological: Bruises/bleeds easily.  Psychiatric/Behavioral: Negative.        Objective:   Physical Exam HEENT exam is unremarkable. Neck is supple and there are no bruits. Her chest was clear to auscultation and percussion. She has a significant kyphoscoliotic curve. Breasts are without tenderness or masses. Cardiac exam is regular rate without murmurs the abdomen is soft the liver and spleen are not enlarged there is no tenderness. GU exam reveals atrophic vaginitis nonpalpable uterus or ovaries. Rectal exam reveals no masses felt extremities without cyanosis clubbing or edema. Pulses are 2+ and symmetrical. Patient has hives present all measuring about 1 cm on her face arms and legs  Results for orders placed in visit on 09/30/13  IFOBT (OCCULT BLOOD)      Result Value Range   IFOBT Negative    GLUCOSE, POCT (MANUAL RESULT ENTRY)      Result Value Range   POC Glucose 90  70 - 99 mg/dl  POCT GLYCOSYLATED HEMOGLOBIN (HGB A1C)      Result Value Range   Hemoglobin A1C 5.5    POCT UA - MICROSCOPIC ONLY      Result Value Range   WBC, Ur, HPF, POC neg     RBC, urine, microscopic 1-5     Bacteria, U Microscopic neg     Mucus, UA trace     Epithelial cells, urine per micros 0-3     Crystals, Ur, HPF, POC neg     Casts, Ur, LPF, POC neg     Yeast, UA neg    POCT URINALYSIS DIPSTICK      Result Value Range   Color, UA yellow     Clarity, UA clear     Glucose, UA neg     Bilirubin, UA neg     Ketones, UA neg     Spec Grav, UA 1.015     Blood, UA moderate     pH, UA 7.0     Protein, UA neg     Urobilinogen, UA 0.2     Nitrite, UA neg     Leukocytes, UA Negative          Assessment & Plan:  Physical exam is normal she is suffering from hives. We'll treat these with Zyrtec 10 mg one a day and topical steroid cream

## 2013-10-01 ENCOUNTER — Telehealth: Payer: Self-pay

## 2013-10-01 ENCOUNTER — Other Ambulatory Visit: Payer: Self-pay

## 2013-10-01 DIAGNOSIS — G4700 Insomnia, unspecified: Secondary | ICD-10-CM

## 2013-10-01 LAB — PAP IG (IMAGE GUIDED)

## 2013-10-01 LAB — TSH: TSH: 3.888 u[IU]/mL (ref 0.350–4.500)

## 2013-10-01 MED ORDER — IMIPRAMINE HCL 25 MG PO TABS: 75.0000 mg | ORAL_TABLET | Freq: Every day | ORAL | Status: DC

## 2013-10-01 NOTE — Addendum Note (Signed)
Addended by: Lesle Chris A on: 10/01/2013 06:21 PM   Modules accepted: Orders

## 2013-10-01 NOTE — Telephone Encounter (Signed)
Pt wants to know if her EKG was ok and if there was any blood in her urine. I saw that there was 1-5 RBC's but I didn't know if you wanted to comment on it. Thanks

## 2013-10-02 NOTE — Telephone Encounter (Signed)
Her EKG was normal. Her urine has some red cells in it but less than last year. The urologist she sees is aware of this. I have forwarded a copy of her urine result to her urologist

## 2013-10-02 NOTE — Telephone Encounter (Signed)
Called patient to advise  °

## 2013-10-03 ENCOUNTER — Telehealth: Payer: Self-pay

## 2013-10-03 NOTE — Telephone Encounter (Signed)
Pt notified of note in other phone message.

## 2013-11-06 ENCOUNTER — Ambulatory Visit (INDEPENDENT_AMBULATORY_CARE_PROVIDER_SITE_OTHER): Payer: Medicare Other | Admitting: Family Medicine

## 2013-11-06 ENCOUNTER — Ambulatory Visit: Payer: Medicare Other

## 2013-11-06 VITALS — BP 140/92 | HR 91 | Resp 18

## 2013-11-06 DIAGNOSIS — G44229 Chronic tension-type headache, not intractable: Secondary | ICD-10-CM

## 2013-11-06 DIAGNOSIS — R6884 Jaw pain: Secondary | ICD-10-CM

## 2013-11-06 DIAGNOSIS — R519 Headache, unspecified: Secondary | ICD-10-CM

## 2013-11-06 DIAGNOSIS — S01511A Laceration without foreign body of lip, initial encounter: Secondary | ICD-10-CM

## 2013-11-06 DIAGNOSIS — W19XXXA Unspecified fall, initial encounter: Secondary | ICD-10-CM

## 2013-11-06 DIAGNOSIS — R51 Headache: Secondary | ICD-10-CM

## 2013-11-06 DIAGNOSIS — S01501A Unspecified open wound of lip, initial encounter: Secondary | ICD-10-CM

## 2013-11-06 MED ORDER — TRAMADOL HCL 50 MG PO TABS: 50.0000 mg | ORAL_TABLET | Freq: Three times a day (TID) | ORAL | Status: DC | PRN

## 2013-11-06 NOTE — Patient Instructions (Signed)
You bit through your lip. We put 3 stitches inside your lip.  These are dissolvable and will disappear over the next week. There is one stitch on your outer lip.  You will need to return to the office on Monday to get the stitch removed.  After eating please rinse your mouth well with water, followed by a salt water rinse. Wash the outer lip 1-2 times a day with soap and water.  The x-rays of your jaw are okay. Put ice on it for 20 minutes 3 times a day. You can take tramadol 1 pill every 8 hours as needed for pain. If it is still bothering you next week, let us know.  You may need to see your dentist for additional evaluation.  If you start having fevers or drainage from your cuts please come back.

## 2013-11-06 NOTE — Progress Notes (Signed)
History and physical exam were reviewed with Dr. Bridgett Larsson.  Additionally, Tetanus vaccine UTD per patient.  Mandible xray reviewed and negative.  Agree with A/P.  RTC five days for suture removal.

## 2013-11-06 NOTE — Progress Notes (Signed)
Subjective:    Patient ID: Tracey Richard, female    DOB: 01/27/1941, 73 y.o.   MRN: [id number]  HPI Tracey Richard is here for a fall with facial laceration.  She states that [place].  Her hands were in her pockets so she landed on the left side of her face.  Denies any dizziness, chest pain, palpitations prior to the fall. Denies any loss of consciousness.  Denies any nausea, vision changes, or dizziness currently.  She does have some pain at the laceration site.  Also reports "crunching" and pain in the left jaw.  She states she falls often, typically on the "last step."  She is not particularly concerned about her frequent falls.  Current Outpatient Prescriptions on File Prior to Visit  Medication Sig Dispense Refill  . aspirin 81 MG tablet Take 81 mg by mouth daily.        Marland Kitchen augmented betamethasone dipropionate (DIPROLENE AF) 0.05 % cream Apply topically 2 (two) times daily.  50 g  1  . DIOVAN 80 MG tablet Take 1 tablet by mouth  daily  90 tablet  1  . imipramine (TOFRANIL) 25 MG tablet Take 3 tablets (75 mg total) by mouth at bedtime. Take 3 tablets at bedtime  270 tablet  1  . omeprazole (PRILOSEC) 40 MG capsule Take 1 capsule by mouth  daily  90 capsule  1  . OVER THE COUNTER MEDICATION OTC Ibuprofen 200 mg taking      . rosuvastatin (CRESTOR) 10 MG tablet Take 1 tablet (10 mg total) by mouth daily.  30 tablet  5  . valsartan (DIOVAN) 40 MG tablet Take 1 tablet (40 mg total) by mouth daily.  90 tablet  0  . benzonatate (TESSALON) 100 MG capsule Use 1-2 tablets 3 times daily as necessary for cough. May be used with other cough medicines if needed.  30 capsule  0  . fluticasone (FLONASE) 50 MCG/ACT nasal spray Use 2 sprays in each  nostril daily  48 g  1  . HYDROcodone-homatropine (HYCODAN) 5-1.5 MG/5ML syrup Take 5 mLs by mouth every 6 (six) hours as needed for cough.  120 mL  0  . valACYclovir (VALTREX) 1000 MG tablet Take 2 tablets  twice a day for 2 doses  20 tablet  0   No current facility-administered medications on file prior to visit.    PMHx: HTN, HLD  SHx: non smoker   Review of Systems See HPI    Objective:   Physical Exam BP 140/92  Pulse 91  Resp 18  SpO2 98% Gen: alert, cooperative, NAD Face: superficial 0.5 scrape on left upper lip; 3cm laceration on interior lower lip which goes through to the outer lip; no dental looseness; no orbital tenderness; full EOMI without pain Jaw: crepitus of left TMJ noted; mild swelling over left jaw; tender along left posterior mandible  Mandible x-ray:      Assessment & Plan:  73 yo F with frequent falls presenting with fall with facial laceration.  # Facial laceration: Wound was irrigated and sutured. - wound care given as in AVS. - Tramadol #20 given for pain  # Left TMJ pain: likely strain of the joint; no dislocation.   - ice TID  - tramadol prn - if persistent may need to be evaluated by her dentist  Procedure Note Informed consent was obtained.  The laceration was explored and irrigated with sterile saline.  Given  the location inside the mouth, the wound was not prepped.  Lidocaine 1% with epi was used for anesthetic.  Sutures were placed on the interior and exterior aspects of the laceration.  Patient tolerated procedure well.

## 2013-11-10 ENCOUNTER — Ambulatory Visit
Admission: RE | Admit: 2013-11-10 | Discharge: 2013-11-10 | Disposition: A | Discharge status: Home / Self Care | Payer: Medicare Other | Source: Ambulatory Visit | Attending: Emergency Medicine | Admitting: Emergency Medicine

## 2013-11-10 ENCOUNTER — Ambulatory Visit (INDEPENDENT_AMBULATORY_CARE_PROVIDER_SITE_OTHER): Payer: Medicare Other | Admitting: Emergency Medicine

## 2013-11-10 ENCOUNTER — Ambulatory Visit: Payer: Medicare Other

## 2013-11-10 VITALS — BP 122/66 | HR 79 | Temp 98.0°F | Resp 16 | Ht 63.0 in | Wt 163.0 lb

## 2013-11-10 DIAGNOSIS — R0781 Pleurodynia: Secondary | ICD-10-CM

## 2013-11-10 DIAGNOSIS — R079 Chest pain, unspecified: Secondary | ICD-10-CM

## 2013-11-10 DIAGNOSIS — W19XXXA Unspecified fall, initial encounter: Secondary | ICD-10-CM

## 2013-11-10 DIAGNOSIS — M25559 Pain in unspecified hip: Secondary | ICD-10-CM

## 2013-11-10 DIAGNOSIS — R6884 Jaw pain: Secondary | ICD-10-CM

## 2013-11-10 DIAGNOSIS — R55 Syncope and collapse: Secondary | ICD-10-CM

## 2013-11-10 MED ORDER — ROSUVASTATIN CALCIUM 10 MG PO TABS: 10.0000 mg | ORAL_TABLET | Freq: Every day | ORAL | Status: DC

## 2013-11-10 NOTE — Progress Notes (Signed)
   Subjective:    Patient ID: Tracey Richard, female    DOB: 01/04/1941, 73 y.o.   MRN: [id number]  HPI Patient is here today with complaint of right jaw pain right ear pain and left jaw pain that's swollen She states that it hurts when she eat She also have complaints of her left ribs hurting  She was seen on Thursday for falling and hurting herself Still having some dizziness   Review of Systems     Objective:   Physical Exam patient is alert and cooperative and in no distress. Her neck is supple. There is tenderness over the left lateral chest wall. Her abdomen is soft without tenderness there is tenderness over the left hip. There is pain with movement of the left hip   [facility] reading (PRIMARY) by  Dr. Everlene Farrier appears to be some basilar atelectasis. I did not see any left rib fractures. There is hardware present in the lower lumbar spine. I did not see any fractures of the left hip  EKG normal sinus      Assessment & Plan:  Proceed with a CT of the head. I'm not sure whether she had a syncopal episode or she may have tripped. Her baseline EKG is normal without evidence of block. She will have a CT of the head and also see a cardiologist.

## 2013-11-10 NOTE — Patient Instructions (Signed)
Please go to [facility] imaging at [address] for a scan to be done at 4:15

## 2013-11-10 NOTE — Progress Notes (Signed)
Spoke with patient regarding her normal head CT.  She will RTC in 1 week to see Dr Everlene Farrier for recheck.

## 2013-11-14 ENCOUNTER — Ambulatory Visit (INDEPENDENT_AMBULATORY_CARE_PROVIDER_SITE_OTHER): Payer: Medicare Other | Admitting: Internal Medicine

## 2013-11-14 ENCOUNTER — Ambulatory Visit (INDEPENDENT_AMBULATORY_CARE_PROVIDER_SITE_OTHER): Payer: Medicare Other | Admitting: Emergency Medicine

## 2013-11-14 ENCOUNTER — Encounter: Payer: Self-pay | Admitting: Internal Medicine

## 2013-11-14 VITALS — BP 128/70 | HR 73 | Ht 63.5 in | Wt 167.4 lb

## 2013-11-14 VITALS — BP 128/78 | HR 82 | Temp 98.6°F | Resp 18 | Ht 63.0 in | Wt 167.0 lb

## 2013-11-14 DIAGNOSIS — I10 Essential (primary) hypertension: Secondary | ICD-10-CM | POA: Insufficient documentation

## 2013-11-14 DIAGNOSIS — Z9181 History of falling: Secondary | ICD-10-CM

## 2013-11-14 DIAGNOSIS — R296 Repeated falls: Secondary | ICD-10-CM

## 2013-11-14 DIAGNOSIS — E785 Hyperlipidemia, unspecified: Secondary | ICD-10-CM | POA: Insufficient documentation

## 2013-11-14 DIAGNOSIS — R195 Other fecal abnormalities: Secondary | ICD-10-CM

## 2013-11-14 LAB — IFOBT (OCCULT BLOOD): IMMUNOLOGICAL FECAL OCCULT BLOOD TEST: NEGATIVE

## 2013-11-14 NOTE — Progress Notes (Signed)
OFFICE NOTE  Chief Complaint:  Fall, possible syncope  Primary Care Physician: Jenny Reichmann, MD  HPI:  Tracey Richard is a pleasant 73 year old female kindly referred to me for evaluation of a possible syncopal episode. She describes an incident the other week where she was walking [place] with her hands in her pockets and the next thing she knew she fell and struck her chin on the ground. She does remember the incident and apparently was conscious during the episode.  She reports she's had several falls recently and has been falling for years. In fact 2 days ago she fell while going down stairs. She uses a falls going upstairs, and oftentimes gets her foot stuck on the stairs. She does have a history of spinal stenosis and has undergone surgery of her back before. She denies any pain in her legs or numbness or tingling in her feet, but does get back pain especially when doing housework. She also denies any chest pain or shortness of breath with exertion. She denies any palpitations or any prodromal symptoms prior to this event. She does have hypertension and is well treated on medication. In addition she takes Crestor for dyslipidemia. Interestingly, she has been on imipramine, and older tricyclic antidepressant for a number of years for knee pain. She has tried to get off of the medicine but finds that she has a difficult time sleeping without it. This medicine is known to be associated with orthostatic hypotension, which could be playing a role in her instability.  PMHx:  Past Medical History  Diagnosis Date  . Cancer   . GERD (gastroesophageal reflux disease)   . Hyperlipidemia   . Hypertension   . Allergy   . Cataract     Past Surgical History  Procedure Laterality Date  . Tonsillectomy  1959  . Back surgery  2007  . Appendectomy  1963  . [place]  . Breast surgery    . Spine surgery    . Eye surgery  1999    bilateral cataract extraction  . Dilation  and curettage of uterus      FAMHx:  Family History  Problem Relation Age of Onset  . Cancer Mother     vaginal cancer, also HTN  . Prostate cancer Father     also HTN, dementia  . Diabetes Brother     SOCHx:   reports that she has never smoked. She has never used smokeless tobacco. She reports that she does not drink alcohol or use illicit drugs.  ALLERGIES:  Allergies  Allergen Reactions  . Penicillins   . Sulfur     ROS: A comprehensive review of systems was negative except for: Musculoskeletal: positive for back pain and knee pain Neurological: positive for gait problems and falls  HOME MEDS: Current Outpatient Prescriptions  Medication Sig Dispense Refill  . aspirin 81 MG tablet Take 81 mg by mouth daily.        . fluticasone (FLONASE) 50 MCG/ACT nasal spray Use 2 sprays in each  nostril daily  48 g  1  . Hypromellose (GENTEAL MILD OP) Apply to eye as needed.      Marland Kitchen imipramine (TOFRANIL) 25 MG tablet Take 3 tablets (75 mg total) by mouth at bedtime. Take 3 tablets at bedtime  270 tablet  1  . omeprazole (PRILOSEC) 40 MG capsule Take 1 capsule by mouth  daily  90 capsule  1  . OVER THE COUNTER MEDICATION OTC  Ibuprofen 200 mg taking      . rosuvastatin (CRESTOR) 10 MG tablet Take 1 tablet (10 mg total) by mouth daily.  30 tablet  5  . traMADol (ULTRAM) 50 MG tablet Take 1 tablet (50 mg total) by mouth every 8 (eight) hours as needed.  20 tablet  0  . triamcinolone cream (KENALOG) 0.1 % Apply 1 application topically 2 (two) times daily.      . valsartan (DIOVAN) 40 MG tablet Take 1 tablet (40 mg total) by mouth daily.  90 tablet  0   No current facility-administered medications for this visit.    LABS/IMAGING: Results for orders placed in visit on 11/14/13 (from the past 48 hour(s))  IFOBT (OCCULT BLOOD)     Status: Normal   Collection Time    11/14/13  8:40 AM      Result Value Range   IFOBT Negative     No results found.  VITALS: BP 128/70  Pulse 73  Ht 5'  3.5" (1.613 m)  Wt 167 lb 6.4 oz (75.932 kg)  BMI 29.18 kg/m2  EXAM: General appearance: alert and no distress Neck: no carotid bruit and no JVD Lungs: rubs LLL Heart: regular rate and rhythm, S1, S2 normal, no murmur, click, rub or gallop Abdomen: soft, non-tender; bowel sounds normal; no masses,  no organomegaly Extremities: extremities normal, atraumatic, no cyanosis or edema Pulses: 2+ and symmetric Skin: Skin color, texture, turgor normal. No rashes or lesions Neurologic: Mental status: Alert, oriented, thought content appropriate Cranial nerves: normal Psych: Pleasant, does not appear depressed  EKG: Sinus rhythm at 73  ASSESSMENT: 1. Frequent falls, unlikely to be true syncope 2. Hypertension-controlled 3. Dyslipidemia on statin  PLAN: 1.   Tracey Richard reports fairly frequent falls, including the one that she suffered outside. She does report that she was aware of falling and being on the ground. There is no loss of recollection therefore no loss of consciousness. Did not feel that this is a true syncopal event, however due to her frequent falls, she should be evaluated by physical therapy, as it may be related to neuropathy or history of spinal stenosis, or some other gait abnormality. A brief neurologic exam and office was nonfocal. Lower extremity strength appears intact. Her blood pressure is well controlled and it is feasible she could have some orthostatic hypotension on imipramine, but she is taking this medicine for some time. It may be worthwhile considering trying to take her off of that medicine, except she feels that she needs it for sleep. She also seems to be doing well on Crestor for her hyperlipidemia. I do not appreciate any cardiac murmurs or carotid bruits.  As I did not feel this was a true syncopal episode, I would not recommend any further testing at this time. Should she have an episode that is clearly syncopal without any prodrome, I would be happy see her  back in recommend further testing.  Thank you again for the referral.  Pixie Casino, MD, [facility] Attending Cardiologist [facility]  Lilinoe Acklin C 11/14/2013, 10:42 AM

## 2013-11-14 NOTE — Progress Notes (Addendum)
   Subjective:  This chart was scribed for Nena Jordan, MD by Elby Beck, ED Scribe. This patient was seen in room 11 and the patient's care was started at 8:12 AM.   Patient ID: Tracey Richard, female    DOB: April 05, 1941, 73 y.o.   MRN: [id number]  Chief Complaint  Patient presents with  . abnormal bm's    noticed last night    HPI  HPI Comments: Tracey Richard is a 73 y.o. female who presents to [facility] complaining of an abnormal BM noticed last night. She states that she examined a BM last night with a plastic fork and knife and found something that resembled "white flesh" and "chicken fat" which concerned her. She denies having any new or suspicious food intakes. She states that she has been feeling well, and that she has had no recent illnesses. She reports that she has had a colonoscopy recently, which she believes was normal.  She was seen in the [facility] days ago, after a fall/syncpal episode and she states that she is feeling better regarding this,  Review of Systems    BP 128/78  Pulse 82  Temp(Src) 98.6 F (37 C) (Oral)  Resp 18  Ht 5\' 3"  (1.6 m)  Wt 167 lb (75.751 kg)  BMI 29.59 kg/m2  SpO2 98% Objective:   Physical Exam  CONSTITUTIONAL: Well developed/well nourished HEAD: Normocephalic/atraumatic EYES: EOMI/PERRL there is decreased swelling around her face. ENMT: Mucous membranes moist NECK: supple no meningeal signs SPINE:entire spine nontender CV: S1/S2 noted, no murmurs/rubs/gallops noted LUNGS: Lungs are clear to auscultation bilaterally, no apparent distress there was tenderness persistent over the left lateral lower ribs ABDOMEN: soft, nontender, no rebound or guarding GU:no cva tenderness NEURO: Pt is awake/alert, moves all extremitiesx4 EXTREMITIES: pulses normal, full ROM SKIN: warm, color normal PSYCH: no abnormalities of mood noted She has a specimen with her that looks  Like fatty type tissue    Results for orders  placed in visit on 11/14/13  IFOBT (OCCULT BLOOD)      Result Value Range   IFOBT Negative     Assessment & Plan:   Will proceed with evaluation of the specimen by culture parasite and pathology review. Hemosure  was done on the specimen. She is up-to-date on her colonoscopy and we'll hold off on that at the present time.  I personally performed the services described in this documentation, which was scribed in my presence. The recorded information has been reviewed and is accurate.

## 2013-11-14 NOTE — Patient Instructions (Addendum)
Your physician recommends that you schedule a follow-up appointment as needed.  Dr. Debara Pickett recommends a PT evaluation for your falls.

## 2013-11-17 LAB — GIARDIA ANTIGEN: Giardia Screen (EIA): NEGATIVE

## 2013-11-17 LAB — OVA AND PARASITE SCREEN: OP: NONE SEEN

## 2013-11-18 LAB — STOOL CULTURE

## 2013-11-19 ENCOUNTER — Ambulatory Visit (INDEPENDENT_AMBULATORY_CARE_PROVIDER_SITE_OTHER): Payer: Medicare Other | Admitting: Physician Assistant

## 2013-11-19 VITALS — BP 128/78 | HR 76 | Temp 98.3°F | Resp 18

## 2013-11-19 DIAGNOSIS — Z4802 Encounter for removal of sutures: Secondary | ICD-10-CM

## 2013-11-20 NOTE — Progress Notes (Signed)
   Subjective:    Patient ID: Tracey Richard, female    DOB: 09/27/1941, 73 y.o.   MRN: [id number]  HPI   Tracey Richard is a very pleasant 73 yr old female here for suture removal.  She was seen here after a fall on 11/06/13 at which time #3 vicryl sutures were placed inside her lower lip, and #1 ethilon suture was placed in her chin.  The external suture was removed, and the wound is healing well.  One of the vicryl sutures has dissolved, but two remain in place.  They are becoming bothersome to her.  She also states that last night she drained a lot of pus out of the inside of the lip.  The lower lip feels numb on the left side and also feels swollen.  There is no pain or redness.  She denies fever or chills   Review of Systems  Constitutional: Negative for fever and chills.  Respiratory: Negative.   Cardiovascular: Negative.   Skin: Positive for wound.       Objective:   Physical Exam  Vitals reviewed. Constitutional: She is oriented to person, place, and time. She appears well-developed and well-nourished. No distress.  HENT:  Head: Normocephalic and atraumatic.  Pulmonary/Chest: Effort normal.  Neurological: She is alert and oriented to person, place, and time.  Skin: Skin is warm and dry.  Facial wounds very well healed; #2 vicryl sutures in place at lower lip; removed without difficulty; unable to express any drainage from the wound; there is no erythema or warmth; there is a small amount of induration but no tenderness        Assessment & Plan:  Visit for suture removal   Tracey Richard is a very pleasant 73 yr old female here for suture removal.  #2 vicryl sutures removed from the oral mucosa.  There is a small amount of induration at the left lower lip but suspect this is due to FB reaction and trauma to the area due to her fall.  There is no tenderness or drainage.  No evidence of infection.  Discussed that numbness at that area of her lip is likely due to trauma.  I  suspect this will resolve with time.  If worsening or pt has concerns, to [place] [facility], PA-C [facility] 1/29/20154:37 PM

## 2013-12-10 ENCOUNTER — Telehealth: Payer: Self-pay

## 2013-12-10 NOTE — Telephone Encounter (Signed)
Spoke to pt. She has a small hard area around her suture site. No signs of infection. Denies pain or feverish feeling. Advised pt that because of the area she had the stitches this sounds like a little area of scar tissue that may decrease in size over time. Pt is advised to watch for signs of infection. Went over those with her. Advised her to RTC if this area continues to bother her or becomes inflamed in any way.

## 2013-12-10 NOTE — Telephone Encounter (Signed)
PT STATES SHE WAS SEEN FOR A WOUND AND HAD STR ITCHES PUT IN AND NOW SHE HAVE A KNOT WHERE THE STRITCHES  WERE AND WANTED TO KNOW IF THAT WAS NORMAL PLEASE CALL [phone]

## 2013-12-11 NOTE — Telephone Encounter (Signed)
Agree. I discussed this with the pt when she came to get her stitches out on 11/19/13.  If she has concerns we would be happy to see her

## 2014-01-05 ENCOUNTER — Encounter: Payer: Self-pay | Admitting: Emergency Medicine

## 2014-02-03 ENCOUNTER — Ambulatory Visit (INDEPENDENT_AMBULATORY_CARE_PROVIDER_SITE_OTHER): Payer: Medicare Other | Admitting: Emergency Medicine

## 2014-02-03 VITALS — BP 165/87 | HR 84 | Temp 98.4°F | Resp 16 | Ht 62.5 in | Wt 164.0 lb

## 2014-02-03 DIAGNOSIS — R739 Hyperglycemia, unspecified: Secondary | ICD-10-CM

## 2014-02-03 DIAGNOSIS — J029 Acute pharyngitis, unspecified: Secondary | ICD-10-CM

## 2014-02-03 DIAGNOSIS — G4700 Insomnia, unspecified: Secondary | ICD-10-CM

## 2014-02-03 DIAGNOSIS — R7309 Other abnormal glucose: Secondary | ICD-10-CM

## 2014-02-03 LAB — GLUCOSE, POCT (MANUAL RESULT ENTRY): POC Glucose: 105 mg/dl — AB (ref 70–99)

## 2014-02-03 LAB — POCT GLYCOSYLATED HEMOGLOBIN (HGB A1C): Hemoglobin A1C: 5.8

## 2014-02-03 MED ORDER — VALSARTAN 40 MG PO TABS: 40.0000 mg | ORAL_TABLET | Freq: Every day | ORAL | Status: DC

## 2014-02-03 MED ORDER — OMEPRAZOLE 40 MG PO CPDR: DELAYED_RELEASE_CAPSULE | ORAL | Status: DC

## 2014-02-03 MED ORDER — IMIPRAMINE HCL 25 MG PO TABS: 75.0000 mg | ORAL_TABLET | Freq: Every day | ORAL | Status: DC

## 2014-02-03 NOTE — Patient Instructions (Signed)
Please use Your  Flonase and Claritin to help with Your allergy symptoms  Allergic Rhinitis Allergic rhinitis is when the mucous membranes in the nose respond to allergens. Allergens are particles in the air that cause your body to have an allergic reaction. This causes you to release allergic antibodies. Through a chain of events, these eventually cause you to release histamine into the blood stream. Although meant to protect the body, it is this release of histamine that causes your discomfort, such as frequent sneezing, congestion, and an itchy, runny nose.  CAUSES  Seasonal allergic rhinitis (hay fever) is caused by pollen allergens that may come from grasses, trees, and weeds. Year-round allergic rhinitis (perennial allergic rhinitis) is caused by allergens such as house dust mites, pet dander, and mold spores.  SYMPTOMS   Nasal stuffiness (congestion).  Itchy, runny nose with sneezing and tearing of the eyes. DIAGNOSIS  Your health care provider can help you determine the allergen or allergens that trigger your symptoms. If you and your health care provider are unable to determine the allergen, skin or blood testing may be used. TREATMENT  Allergic Rhinitis does not have a cure, but it can be controlled by:  Medicines and allergy shots (immunotherapy).  Avoiding the allergen. Hay fever may often be treated with antihistamines in pill or nasal spray forms. Antihistamines block the effects of histamine. There are over-the-counter medicines that may help with nasal congestion and swelling around the eyes. Check with your health care provider before taking or giving this medicine.  If avoiding the allergen or the medicine prescribed do not work, there are many new medicines your health care provider can prescribe. Stronger medicine may be used if initial measures are ineffective. Desensitizing injections can be used if medicine and avoidance does not work. Desensitization is when a patient is  given ongoing shots until the body becomes less sensitive to the allergen. Make sure you follow up with your health care provider if problems continue. HOME CARE INSTRUCTIONS It is not possible to completely avoid allergens, but you can reduce your symptoms by taking steps to limit your exposure to them. It helps to know exactly what you are allergic to so that you can avoid your specific triggers. SEEK MEDICAL CARE IF:   You have a fever.  You develop a cough that does not stop easily (persistent).  You have shortness of breath.  You start wheezing.  Symptoms interfere with normal daily activities. Document Released: 07/04/2001 Document Revised: 07/30/2013 Document Reviewed: 06/16/2013 [facility] Patient Information 2014 [place].

## 2014-02-03 NOTE — Progress Notes (Addendum)
   Subjective:    Patient ID: Tracey Richard, female    DOB: 1940/12/16, 73 y.o.   MRN: [id number]  HPI  This chart was scribed for Tracey Russian, MD by Ladene Artist, ED Scribe. The patient was seen in room 23. Patient's care was started at 11:07 AM.  HPI Comments: [name] is a 73 y.o. female, with a history of HTN, DM and Dyslipidemia, who presents to the [facility] for 3 month DM follow-up.she has recently had some problems with allergies. She's had some hoarseness in her voice. She also has noted some nasal congestion and intermittent bleeding from her nose.    Past Medical History  Diagnosis Date  . Cancer   . GERD (gastroesophageal reflux disease)   . Hyperlipidemia   . Hypertension   . Allergy   . Cataract    Past Surgical History  Procedure Laterality Date  . Tonsillectomy  1959  . Back surgery  2007  . Appendectomy  1963  . [place]  . Breast surgery    . Spine surgery    . Eye surgery  1999    bilateral cataract extraction  . Dilation and curettage of uterus     Allergies  Allergen Reactions  . Penicillins   . Sulfur     Review of Systems     Objective:   Physical Exam CONSTITUTIONAL: Well developed/well nourished HEAD: Normocephalic/atraumaticpatient is hoarse. There is slight  redness of the posterior pharynx. EYES: EOMI/PERRL ENMT: Mucous membranes moist NECK: supple no meningeal signs SPINE:entire spine nontender CV: S1/S2 noted, no murmurs/rubs/gallops noted LUNGS: Lungs are clear to auscultation bilaterally, no apparent distress ABDOMEN: soft, nontender, no rebound or guarding GU:no cva tenderness NEURO: Pt is awake/alert, moves all extremitiesx4 EXTREMITIES: pulses normal, full ROM SKIN: warm, color normal PSYCH: no abnormalities of mood noted Results for orders placed in visit on 02/03/14  POCT GLYCOSYLATED HEMOGLOBIN (HGB A1C)      Result Value Ref Range   Hemoglobin A1C 5.8    GLUCOSE, POCT (MANUAL  RESULT ENTRY)      Result Value Ref Range   POC Glucose 105 (*) 70 - 99 mg/dl      Assessment & Plan:  Patient looks good. I do feel she is having allergies. Advised her to take her Claritin and use her Flonase.her hyperglycemia is under excellent I personally performed the services described in this documentation, which was scribed in my presence. The recorded information has been reviewed and is accurate.

## 2014-02-04 LAB — POCT RAPID STREP A (OFFICE): Rapid Strep A Screen: NEGATIVE

## 2014-06-02 ENCOUNTER — Ambulatory Visit (INDEPENDENT_AMBULATORY_CARE_PROVIDER_SITE_OTHER): Payer: Medicare Other | Admitting: Emergency Medicine

## 2014-06-02 VITALS — BP 134/77 | HR 80 | Temp 98.2°F | Resp 16 | Ht 62.5 in | Wt 167.0 lb

## 2014-06-02 DIAGNOSIS — R7309 Other abnormal glucose: Secondary | ICD-10-CM

## 2014-06-02 DIAGNOSIS — E785 Hyperlipidemia, unspecified: Secondary | ICD-10-CM

## 2014-06-02 DIAGNOSIS — R739 Hyperglycemia, unspecified: Secondary | ICD-10-CM

## 2014-06-02 DIAGNOSIS — E119 Type 2 diabetes mellitus without complications: Secondary | ICD-10-CM

## 2014-06-02 LAB — LIPID PANEL
CHOL/HDL RATIO: 5.2 ratio
Cholesterol: 257 mg/dL — ABNORMAL HIGH (ref 0–200)
HDL: 49 mg/dL (ref >39)
LDL Cholesterol: 190 mg/dL — ABNORMAL HIGH (ref 0–99)
TRIGLYCERIDES: 92 mg/dL (ref <150)
VLDL: 18 mg/dL (ref 0–40)

## 2014-06-02 LAB — POCT GLYCOSYLATED HEMOGLOBIN (HGB A1C): Hemoglobin A1C: 5.9

## 2014-06-02 LAB — GLUCOSE, POCT (MANUAL RESULT ENTRY): POC GLUCOSE: 88 mg/dL (ref 70–99)

## 2014-06-02 NOTE — Progress Notes (Addendum)
Subjective:    Patient ID: Tracey Richard, female    DOB: [birth year], 73 y.o.   MRN: [id number] This chart was scribed for [name], MD by Cathie Hoops, ED Scribe. The patient was seen in Room/bed info not found. The patient's care was started at 11:21 AM.   HPI HPI Comments: Tracey Richard is a 73 y.o. female who presents to the [facility] for a follow-up visit. Pt reports her right ear is giving he some issues, she states she had some bleeding three days ago. Pt reports she has two episodes of this ear bleeding in one month. Pt reports she saw Dr. Lucia Gaskins for her ear and she last saw him two months ago.   Pt also reports her right hip "locks" up when she ambulates and causes pain in her groin. The pt reports the pain is so intense that she has to stop ambulating. Pt reports it is gradually improving she believes since changing her sleep number bed and changing her in-soles.  Review of Systems  Constitutional: Negative for fever and chills.  HENT: Positive for ear discharge (blood in right ear).   Gastrointestinal: Negative for nausea and vomiting.  Musculoskeletal: Positive for arthralgias (right hip).    Past Medical History  Diagnosis Date  . Cancer   . GERD (gastroesophageal reflux disease)   . Hyperlipidemia   . Hypertension   . Allergy   . Cataract    Past Surgical History  Procedure Laterality Date  . Tonsillectomy  1959  . Back surgery  2007  . Appendectomy  1963  . [place]  . Breast surgery    . Spine surgery    . Eye surgery  1999    bilateral cataract extraction  . Dilation and curettage of uterus     Allergies  Allergen Reactions  . Penicillins   . Sulfur    Current Outpatient Prescriptions  Medication Sig Dispense Refill  . aspirin 81 MG tablet Take 81 mg by mouth daily.        . fluticasone (FLONASE) 50 MCG/ACT nasal spray Use 2 sprays in each  nostril daily  48 g  1  . Hypromellose (GENTEAL MILD OP) Apply to eye as  needed.      Marland Kitchen imipramine (TOFRANIL) 25 MG tablet Take 3 tablets (75 mg total) by mouth at bedtime. Take 3 tablets at bedtime  270 tablet  1  . omeprazole (PRILOSEC) 40 MG capsule Take 1 capsule by mouth  daily  90 capsule  1  . OVER THE COUNTER MEDICATION OTC Ibuprofen 200 mg taking      . traMADol (ULTRAM) 50 MG tablet Take 1 tablet (50 mg total) by mouth every 8 (eight) hours as needed.  20 tablet  0  . triamcinolone cream (KENALOG) 0.1 % Apply 1 application topically 2 (two) times daily.      . valsartan (DIOVAN) 40 MG tablet Take 1 tablet (40 mg total) by mouth daily.  90 tablet  1  . Red Yeast Rice Extract (RED YEAST RICE PO) Take by mouth.      . rosuvastatin (CRESTOR) 10 MG tablet Take 1 tablet (10 mg total) by mouth daily.  30 tablet  5   No current facility-administered medications for this visit.       Objective:  Triage Vitals: BP 134/77  Pulse 80  Temp(Src) 98.2 F (36.8 C)  Resp 16  Ht 5' 2.5" (1.588 m)  Wt 167  lb (75.751 kg)  BMI 30.04 kg/m2  SpO2 96%  Physical Exam CONSTITUTIONAL: Well developed/well nourished HEAD: Normocephalic/atraumatic EYES: EOMI/PERRL ENMT: Mucous membranes moist NECK: supple no meningeal signs SPINE:entire spine nontender CV: S1/S2 noted, no murmurs/rubs/gallops noted LUNGS: Lungs are clear to auscultation bilaterally, no apparent distress ABDOMEN: soft, nontender, no rebound or guarding GU:no cva tenderness NEURO: Pt is awake/alert, moves all extremitiesx4 EXTREMITIES: pulses normal, full ROM SKIN: warm, color normal PSYCH: no abnormalities of mood noted  Results for orders placed in visit on 06/02/14  GLUCOSE, POCT (MANUAL RESULT ENTRY)      Result Value Ref Range   POC Glucose 88  70 - 99 mg/dl  POCT GLYCOSYLATED HEMOGLOBIN (HGB A1C)      Result Value Ref Range   Hemoglobin A1C 5.9      Assessment & Plan:  11:25 AM- Patient informed of current plan for treatment and evaluation and agrees with plan at this time. A1c is 5.9  no change in these medications recheck 3-4 months patient had an episode of heartburn she suspects is secondary to garlic and fish will she was taking. She has persistent pain and swelling in her right year with bleeding and is going to see the ENT specialist Dr. Lucia Gaskins about this. We'll see what her cholesterol is and if it is still elevated she will consider taking a statin.Marland Kitchen

## 2014-06-05 ENCOUNTER — Encounter: Payer: Self-pay | Admitting: Radiology

## 2014-06-05 ENCOUNTER — Other Ambulatory Visit: Payer: Self-pay | Admitting: Radiology

## 2014-06-05 MED ORDER — SIMVASTATIN 40 MG PO TABS: 40.0000 mg | ORAL_TABLET | Freq: Every day | ORAL | Status: DC

## 2014-06-12 ENCOUNTER — Telehealth: Payer: Self-pay | Admitting: *Deleted

## 2014-06-12 NOTE — Telephone Encounter (Signed)
Spoke with patient, who provided her opthamology information, which has been updated in care team section.  With patient;s permission, Dr. Rachael Fee office was phoned, I spoke with Whitney, and she is faxing a copy of patient's diabetic eye exam.

## 2014-07-12 ENCOUNTER — Other Ambulatory Visit: Payer: Self-pay | Admitting: Emergency Medicine

## 2014-07-18 ENCOUNTER — Other Ambulatory Visit: Payer: Self-pay | Admitting: Emergency Medicine

## 2014-07-31 ENCOUNTER — Other Ambulatory Visit: Payer: Self-pay | Admitting: Emergency Medicine

## 2014-08-12 ENCOUNTER — Other Ambulatory Visit: Payer: Self-pay

## 2014-08-12 MED ORDER — FLUTICASONE PROPIONATE 50 MCG/ACT NA SUSP: NASAL | Status: DC

## 2014-08-12 NOTE — Telephone Encounter (Signed)
Pharm reqs RFs of fluticasone NS. Dr Everlene Farrier, you saw pt recently but don't see AR discussed. Can we give RFs?

## 2014-08-14 ENCOUNTER — Encounter: Payer: Self-pay | Admitting: Emergency Medicine

## 2014-09-08 ENCOUNTER — Ambulatory Visit (INDEPENDENT_AMBULATORY_CARE_PROVIDER_SITE_OTHER): Payer: Medicare Other

## 2014-09-08 ENCOUNTER — Encounter: Payer: Self-pay | Admitting: Emergency Medicine

## 2014-09-08 ENCOUNTER — Ambulatory Visit (INDEPENDENT_AMBULATORY_CARE_PROVIDER_SITE_OTHER): Payer: Medicare Other | Admitting: Emergency Medicine

## 2014-09-08 VITALS — BP 122/78 | HR 85 | Temp 98.2°F | Resp 16 | Ht 64.0 in | Wt 167.2 lb

## 2014-09-08 DIAGNOSIS — R05 Cough: Secondary | ICD-10-CM

## 2014-09-08 DIAGNOSIS — E785 Hyperlipidemia, unspecified: Secondary | ICD-10-CM

## 2014-09-08 DIAGNOSIS — J189 Pneumonia, unspecified organism: Secondary | ICD-10-CM

## 2014-09-08 DIAGNOSIS — R7309 Other abnormal glucose: Secondary | ICD-10-CM

## 2014-09-08 DIAGNOSIS — R059 Cough, unspecified: Secondary | ICD-10-CM

## 2014-09-08 DIAGNOSIS — J069 Acute upper respiratory infection, unspecified: Secondary | ICD-10-CM

## 2014-09-08 LAB — POCT CBC
GRANULOCYTE PERCENT: 64.7 % (ref 37–80)
HEMATOCRIT: 39.6 % (ref 37.7–47.9)
HEMOGLOBIN: 12.4 g/dL (ref 12.2–16.2)
Lymph, poc: 2 (ref 0.6–3.4)
MCH: 28.1 pg (ref 27–31.2)
MCHC: 31.3 g/dL — AB (ref 31.8–35.4)
MCV: 89.6 fL (ref 80–97)
MID (CBC): 0.4 (ref 0–0.9)
MPV: 7.7 fL (ref 0–99.8)
PLATELET COUNT, POC: 261 10*3/uL (ref 142–424)
POC Granulocyte: 4.5 (ref 2–6.9)
POC LYMPH PERCENT: 29.7 %L (ref 10–50)
POC MID %: 5.6 %M (ref 0–12)
RBC: 4.41 M/uL (ref 4.04–5.48)
RDW, POC: 15.4 %
WBC: 6.9 10*3/uL (ref 4.6–10.2)

## 2014-09-08 LAB — POCT GLYCOSYLATED HEMOGLOBIN (HGB A1C): Hemoglobin A1C: 6

## 2014-09-08 MED ORDER — AZITHROMYCIN 250 MG PO TABS: ORAL_TABLET | ORAL | Status: DC

## 2014-09-08 MED ORDER — LEVOFLOXACIN 500 MG PO TABS: 500.0000 mg | ORAL_TABLET | Freq: Every day | ORAL | Status: AC

## 2014-09-08 MED ORDER — LEVOFLOXACIN 500 MG PO TABS: 500.0000 mg | ORAL_TABLET | Freq: Every day | ORAL | Status: DC

## 2014-09-08 MED ORDER — HYDROCODONE-HOMATROPINE 5-1.5 MG/5ML PO SYRP: 5.0000 mL | ORAL_SOLUTION | Freq: Three times a day (TID) | ORAL | Status: DC | PRN

## 2014-09-08 NOTE — Progress Notes (Addendum)
IDENTIFYING INFORMATION  Tracey Richard / DOB: October 07, 1941 / MRN: [id number]  Tracey patient has Atypical hyperplasia of breast, right; Diabetes; Falls frequently; HTN (hypertension); and Dyslipidemia on her problem list.  SUBJECTIVE  Chief Complaint: Cough   History of present illness: Tracey Richard is a 73 y.o. year old female who presents for three days of coughing.  This episode began with a mild sore throat three days ago and she reports that her symptoms "moved to her chest."  She reports one episode of hematemesis on Saturday which was minimal.  She reports her cough is very productive. She endorses SOB, DOE, and some mild chest pain that is worse with respiration located in Tracey center of Tracey chest at Tracey level of Tracey fourth rib.  She denies chills, diaphoresis, presyncope, palpitations and radiation of Tracey pain.    She reports starting simvastatin 4 months ago.  She has been compliant with therapy.      She  has a past medical history of Cancer; GERD (gastroesophageal reflux disease); Hyperlipidemia; Hypertension; Allergy; and Cataract.    Tracey Richard has a current medication list which includes Tracey following prescription(s): aspirin, fluticasone, hypromellose, imipramine, omeprazole, OVER Tracey COUNTER MEDICATION, simvastatin, tramadol, triamcinolone cream, valsartan, and red yeast rice extract.  Tracey Richard is allergic to penicillins and sulfur. She  reports that she has never smoked. She has never used smokeless tobacco. She reports that she does not drink alcohol or use illicit drugs. She  reports that she does not engage in sexual activity.  Tracey patient  has past surgical history that includes Tonsillectomy (1959); Back surgery (2007); Appendectomy (1963); moles (1965); Breast surgery; Spine surgery; Eye surgery (1999); and Dilation and curettage of uterus.  Her family history includes Cancer in her mother; Diabetes in her brother; Prostate cancer in her father.  Review of  Systems  Constitutional: Negative.   HENT: Negative.   Eyes: Negative.   Respiratory: Positive for cough, hemoptysis, sputum production and shortness of breath. Negative for wheezing.   Cardiovascular: Positive for chest pain (pleuritic).  Gastrointestinal: Negative.   Genitourinary: Negative.   Musculoskeletal: Negative.   Skin: Negative.   Neurological: Negative.     OBJECTIVE  Blood pressure 122/78, pulse 85, temperature 98.2 F (36.8 C), temperature source Oral, resp. rate 16, height 5\' 4"  (1.626 m), weight 167 lb 3.2 oz (75.841 kg), SpO2 98 %. Tracey patient's body mass index is 28.69 kg/(m^2).  Physical Exam  Constitutional: She is oriented to person, place, and time. She appears well-developed and well-nourished.  HENT:  Head: Normocephalic.  Eyes: Conjunctivae and EOM are normal. Pupils are equal, round, and reactive to light.  Neck: Neck supple.  Cardiovascular: Normal rate, regular rhythm, normal heart sounds and intact distal pulses.   Respiratory: Effort normal. No accessory muscle usage or stridor. No respiratory distress. She has no decreased breath sounds. She has no wheezes. She has rhonchi in Tracey right lower field. She has no rales. She exhibits tenderness (center sternum at Tracey level of Tracey 4th rib).  GI: Soft. Bowel sounds are normal. There is no tenderness.  Musculoskeletal: Normal range of motion.  Lymphadenopathy:    She has no cervical adenopathy.  Neurological: She is alert and oriented to person, place, and time. She has normal reflexes.  Skin: Skin is warm and dry. No rash noted. No erythema. No pallor.  Psychiatric: She has a normal mood and affect. Her behavior is normal. Judgment and thought content normal.   [facility] reading (  PRIMARY) by  Dr. Everlene Farrier there is elevation of Tracey right hemidiaphragm with a patchy right lower lobe infiltrate. This also may be atelectasis.   Results for orders placed or performed in visit on 09/08/14 (from Tracey past 24 hour(s))    POCT glycosylated hemoglobin (Hb A1C)     Status: Abnormal   Collection Time: 09/08/14 12:37 PM  Result Value Ref Range   Hemoglobin A1C 6.0   POCT CBC     Status: Abnormal   Collection Time: 09/08/14  1:03 PM  Result Value Ref Range   WBC 6.9 4.6 - 10.2 K/uL   Lymph, poc 2.0 0.6 - 3.4   POC LYMPH PERCENT 29.7 10 - 50 %L   MID (cbc) 0.4 0 - 0.9   POC MID % 5.6 0 - 12 %M   POC Granulocyte 4.5 2 - 6.9   Granulocyte percent 64.7 37 - 80 %G   RBC 4.41 4.04 - 5.48 M/uL   Hemoglobin 12.4 12.2 - 16.2 g/dL   HCT, POC 39.6 37.7 - 47.9 %   MCV 89.6 80 - 97 fL   MCH, POC 28.1 27 - 31.2 pg   MCHC 31.3 (A) 31.8 - 35.4 g/dL   RDW, POC 15.4 %   Platelet Count, POC 261 142 - 424 K/uL   MPV 7.7 0 - 99.8 fL    ASSESSMENT & PLAN  Kaybree was seen today for cough.  Diagnoses and associated orders for this visit:  Pneumonia, organism unspecified:  White count negative and without left shift.  However, given Tracey positive chest xray and PE finding, it is reasonable to treat empirically.    - levofloxacin (LEVAQUIN) 500 MG tablet; Take 1 tablet (500 mg total) by mouth daily.  Elevated hemoglobin A1c measurement - POCT glycosylated hemoglobin (Hb A1C)  Dyslipidemia - Comprehensive metabolic panel - Lipid panel  Cough - POCT CBC - DG Chest 2 View; Future - HYDROcodone-homatropine (HYCODAN) 5-1.5 MG/5ML syrup; Take 5 mLs by mouth every 8 (eight) hours as needed for cough.       Tracey patient was instructed to to call or comeback to clinic as needed, or should symptoms warrant.  Philis Fendt, MHS, PA-C [facility] 09/08/2014 1:16 PM

## 2014-09-08 NOTE — Patient Instructions (Signed)

## 2014-09-09 LAB — LIPID PANEL
Cholesterol: 146 mg/dL (ref 0–200)
HDL: 47 mg/dL (ref >39)
LDL Cholesterol: 84 mg/dL (ref 0–99)
TRIGLYCERIDES: 75 mg/dL (ref <150)
Total CHOL/HDL Ratio: 3.1 Ratio
VLDL: 15 mg/dL (ref 0–40)

## 2014-09-09 LAB — COMPREHENSIVE METABOLIC PANEL
ALBUMIN: 3.9 g/dL (ref 3.5–5.2)
ALT: 21 U/L (ref 0–35)
AST: 20 U/L (ref 0–37)
Alkaline Phosphatase: 52 U/L (ref 39–117)
BUN: 13 mg/dL (ref 6–23)
CALCIUM: 9 mg/dL (ref 8.4–10.5)
CHLORIDE: 105 meq/L (ref 96–112)
CO2: 28 mEq/L (ref 19–32)
Creat: 0.94 mg/dL (ref 0.50–1.10)
GLUCOSE: 87 mg/dL (ref 70–99)
POTASSIUM: 4.2 meq/L (ref 3.5–5.3)
SODIUM: 141 meq/L (ref 135–145)
TOTAL PROTEIN: 6.3 g/dL (ref 6.0–8.3)
Total Bilirubin: 0.6 mg/dL (ref 0.2–1.2)

## 2014-09-11 ENCOUNTER — Telehealth: Payer: Self-pay

## 2014-09-11 NOTE — Telephone Encounter (Signed)
Lipids have improved. Pt has been advised of lab results.

## 2014-09-11 NOTE — Telephone Encounter (Signed)
PT IS CALLING FOR LAB RESULTS FROM [facility] ON Tuesday 09/08/14

## 2014-09-15 ENCOUNTER — Encounter: Payer: Self-pay | Admitting: Emergency Medicine

## 2014-09-15 ENCOUNTER — Ambulatory Visit (INDEPENDENT_AMBULATORY_CARE_PROVIDER_SITE_OTHER): Payer: Medicare Other | Admitting: Emergency Medicine

## 2014-09-15 VITALS — BP 130/80 | HR 77 | Temp 98.2°F | Resp 16 | Ht 63.5 in | Wt 172.0 lb

## 2014-09-15 DIAGNOSIS — J189 Pneumonia, unspecified organism: Secondary | ICD-10-CM

## 2014-09-15 DIAGNOSIS — E785 Hyperlipidemia, unspecified: Secondary | ICD-10-CM

## 2014-09-15 NOTE — Progress Notes (Signed)
Subjective:  This chart was scribed for [name], MD by Erling Conte, Medical Scribe. This patient was seen in Room 22 and the patient's care was started at 11:31 AM.    Patient ID: Tracey Richard, female    DOB: Feb 23, 1941, 73 y.o.   MRN: [id number]  Chief Complaint  Patient presents with  . Follow-up  . Pneumonia    HPI HPI Comments: Tracey Richard is a 73 y.o. female who presents to the [facility] for a follow up of her appt last week. Pt had a CXR done and CXR did not reveal pneumonia just atelectasis. . Pt states she is feeling much better. She notes she is still having a productive cough. Pt has been taking Mucinex and Levaquin with relief. Pt has been taking Zocor for her hyperlipidemia and she notes her cholesterol has gone down to 146. Denies any fever, chest pain or shortness of breath   Patient Active Problem List   Diagnosis Date Noted  . Falls frequently 11/14/2013  . HTN (hypertension) 11/14/2013  . Dyslipidemia 11/14/2013  . Diabetes 11/19/2012  . 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 Cancer   . GERD (gastroesophageal reflux disease)   . Hyperlipidemia   . Hypertension   . Allergy   . Cataract    Past Surgical History  Procedure Laterality Date  . Tonsillectomy  1959  . Back surgery  2007  . Appendectomy  1963  . [place]  . Breast surgery    . Spine surgery    . Eye surgery  1999    bilateral cataract extraction  . Dilation and curettage of uterus     Allergies  Allergen Reactions  . Penicillins   . Sulfur    Prior to Admission medications   Medication Sig Start Date End Date Taking? Authorizing Provider  aspirin 81 MG tablet Take 81 mg by mouth daily.      Historical Provider, MD  fluticasone [facility]enc[facility]) 50 MCG/ACT nasal spray Use 2 sprays in each  nostril daily 08/12/14   [name], MD  HYDROcodone-homatropine [facility]) 5-1.5 MG/5ML syrup Take 5 mLs by  mouth every 8 (eight) hours as needed for cough. 09/08/14   [name], MD  Hypromellose (GENTEAL MILD OP) Apply to eye as needed.    Historical Provider, MD  imipramine (TOFRANIL) 25 MG tablet TAKE 3 TABLETS (75 MG TOTAL) BY MOUTH AT BEDTIME. 08/01/14   Mancel Bale, PA-C  levofloxacin (LEVAQUIN) 500 MG tablet Take 1 tablet (500 mg total) by mouth daily. 09/08/14 09/18/14  [name], MD  omeprazole (PRILOSEC) 40 MG capsule TAKE 1 CAPSULE BY MOUTH  DAILY 07/20/14   [name], MD  OVER THE COUNTER MEDICATION OTC Ibuprofen 200 mg taking    Historical Provider, MD  Red Yeast Rice Extract (RED YEAST RICE PO) Take by mouth.    Historical Provider, MD  simvastatin (ZOCOR) 40 MG tablet Take 1 tablet (40 mg total) by mouth at bedtime. 06/05/14   [name], MD  traMADol (ULTRAM) 50 MG tablet Take 1 tablet (50 mg total) by mouth every 8 (eight) hours as needed. 11/06/13   Melony Overly, MD  triamcinolone cream (KENALOG) 0.1 % Apply 1 application topically 2 (two) times daily.    Historical Provider, MD  valsartan (DIOVAN) 40 MG tablet TAKE 1 TABLET (40 MG TOTAL) BY MOUTH DAILY. 07/13/14  Fara Chute, PA-C   History   Social History  . Marital Status: Single    Spouse Name: N/A    Number of Children: N/A  . Years of Education: N/A   Occupational History  . retired    Social History Main Topics  . Smoking status: Never Smoker   . Smokeless tobacco: Never Used  . Alcohol Use: No  . Drug Use: No  . Sexual Activity: No   Other Topics Concern  . Not on file   Social History Narrative   Walks daily. Divorced. Education: [employer].    Review of Systems  Constitutional: Negative for fever and chills.  Respiratory: Positive for cough. Negative for shortness of breath.   Cardiovascular: Negative for chest pain.  Gastrointestinal: Negative for nausea, vomiting, abdominal pain and diarrhea.       Objective:   Physical Exam CONSTITUTIONAL: Well developed/well  nourished HEAD: Normocephalic/atraumatic EYES: EOMI/PERRL ENMT: Mucous membranes moist NECK: supple no meningeal signs SPINE/BACK:entire spine nontender CV: S1/S2 noted, no murmurs/rubs/gallops noted LUNGS: Lungs are clear to auscultation bilaterally, no apparent distress ABDOMEN: soft, nontender, no rebound or guarding, bowel sounds noted throughout abdomen GU:no cva tenderness NEURO: Pt is awake/alert/appropriate, moves all extremitiesx4.  No facial droop.   EXTREMITIES: pulses normal/equal, full ROM SKIN: warm, color normal PSYCH: no abnormalities of mood noted, alert and oriented to situation        Assessment & Plan:  Pt doing well. Cough is improving, breathing better. She will finish her Levaquin. Return to clinic in 2-3 months for CPE. I personally performed the services described in this documentation, which was scribed in my presence. The recorded information has been reviewed and is accurate.

## 2014-10-12 ENCOUNTER — Other Ambulatory Visit: Payer: Self-pay | Admitting: Emergency Medicine

## 2014-10-13 NOTE — Telephone Encounter (Signed)
Pt has appt scheduled 01/12/15. Dr Everlene Farrier, I don't see this med discussed recently. Can we RF until appt?

## 2014-11-02 DIAGNOSIS — K219 Gastro-esophageal reflux disease without esophagitis: Secondary | ICD-10-CM | POA: Diagnosis not present

## 2014-11-02 DIAGNOSIS — K449 Diaphragmatic hernia without obstruction or gangrene: Secondary | ICD-10-CM | POA: Diagnosis not present

## 2014-11-12 DIAGNOSIS — H40013 Open angle with borderline findings, low risk, bilateral: Secondary | ICD-10-CM | POA: Diagnosis not present

## 2014-12-12 ENCOUNTER — Other Ambulatory Visit: Payer: Self-pay | Admitting: Emergency Medicine

## 2015-01-12 ENCOUNTER — Encounter: Payer: Self-pay | Admitting: Emergency Medicine

## 2015-01-12 ENCOUNTER — Ambulatory Visit (INDEPENDENT_AMBULATORY_CARE_PROVIDER_SITE_OTHER): Payer: Medicare Other | Admitting: Emergency Medicine

## 2015-01-12 ENCOUNTER — Other Ambulatory Visit: Payer: Self-pay | Admitting: Emergency Medicine

## 2015-01-12 VITALS — BP 120/80 | HR 77 | Temp 98.8°F | Resp 16 | Ht 63.5 in | Wt 169.8 lb

## 2015-01-12 DIAGNOSIS — R7989 Other specified abnormal findings of blood chemistry: Secondary | ICD-10-CM

## 2015-01-12 DIAGNOSIS — E119 Type 2 diabetes mellitus without complications: Secondary | ICD-10-CM | POA: Diagnosis not present

## 2015-01-12 DIAGNOSIS — E785 Hyperlipidemia, unspecified: Secondary | ICD-10-CM

## 2015-01-12 DIAGNOSIS — Z23 Encounter for immunization: Secondary | ICD-10-CM | POA: Diagnosis not present

## 2015-01-12 DIAGNOSIS — R945 Abnormal results of liver function studies: Secondary | ICD-10-CM

## 2015-01-12 DIAGNOSIS — R739 Hyperglycemia, unspecified: Secondary | ICD-10-CM

## 2015-01-12 LAB — COMPLETE METABOLIC PANEL WITH GFR
ALBUMIN: 4.1 g/dL (ref 3.5–5.2)
ALK PHOS: 49 U/L (ref 39–117)
ALT: 39 U/L — ABNORMAL HIGH (ref 0–35)
AST: 32 U/L (ref 0–37)
BUN: 11 mg/dL (ref 6–23)
CALCIUM: 9.1 mg/dL (ref 8.4–10.5)
CO2: 26 mEq/L (ref 19–32)
Chloride: 106 mEq/L (ref 96–112)
Creat: 0.96 mg/dL (ref 0.50–1.10)
GFR, EST NON AFRICAN AMERICAN: 60 mL/min
GFR, Est African American: 69 mL/min
Glucose, Bld: 97 mg/dL (ref 70–99)
Potassium: 4 mEq/L (ref 3.5–5.3)
SODIUM: 141 meq/L (ref 135–145)
Total Bilirubin: 0.4 mg/dL (ref 0.2–1.2)
Total Protein: 6.4 g/dL (ref 6.0–8.3)

## 2015-01-12 LAB — CBC WITH DIFFERENTIAL/PLATELET
BASOS ABS: 0 10*3/uL (ref 0.0–0.1)
Basophils Relative: 0 % (ref 0–1)
EOS PCT: 3 % (ref 0–5)
Eosinophils Absolute: 0.2 10*3/uL (ref 0.0–0.7)
HCT: 38.6 % (ref 36.0–46.0)
Hemoglobin: 12.5 g/dL (ref 12.0–15.0)
Lymphocytes Relative: 25 % (ref 12–46)
Lymphs Abs: 1.6 10*3/uL (ref 0.7–4.0)
MCH: 28.2 pg (ref 26.0–34.0)
MCHC: 32.4 g/dL (ref 30.0–36.0)
MCV: 87.1 fL (ref 78.0–100.0)
MPV: 10 fL (ref 8.6–12.4)
Monocytes Absolute: 0.6 10*3/uL (ref 0.1–1.0)
Monocytes Relative: 9 % (ref 3–12)
Neutro Abs: 4 10*3/uL (ref 1.7–7.7)
Neutrophils Relative %: 63 % (ref 43–77)
Platelets: 293 10*3/uL (ref 150–400)
RBC: 4.43 MIL/uL (ref 3.87–5.11)
RDW: 14.2 % (ref 11.5–15.5)
WBC: 6.4 10*3/uL (ref 4.0–10.5)

## 2015-01-12 LAB — MICROALBUMIN, URINE: Microalb, Ur: 1.2 mg/dL (ref <2.0)

## 2015-01-12 LAB — LIPID PANEL
Cholesterol: 152 mg/dL (ref 0–200)
HDL: 53 mg/dL (ref >=46)
LDL Cholesterol: 85 mg/dL (ref 0–99)
Total CHOL/HDL Ratio: 2.9 Ratio
Triglycerides: 70 mg/dL (ref <150)
VLDL: 14 mg/dL (ref 0–40)

## 2015-01-12 LAB — GLUCOSE, POCT (MANUAL RESULT ENTRY): POC Glucose: 114 mg/dl — AB (ref 70–99)

## 2015-01-12 LAB — POCT GLYCOSYLATED HEMOGLOBIN (HGB A1C): HEMOGLOBIN A1C: 5.9

## 2015-01-12 NOTE — Progress Notes (Addendum)
Subjective:  This chart was scribed for [name], MD by Ladene Artist, ED Scribe. The patient was seen in room 21. Patient's care was started at 10:04 AM.   Patient ID: Tracey Richard, female    DOB: Nov 13, 1940, 74 y.o.   MRN: [id number]  Chief Complaint  Patient presents with  . Annual Exam   HPI HPI Comments: Tracey Richard is a 74 y.o. female, with a HTN, hyperlipidemia, CA, GERD, who presents to the [facility] for an annual exam. Pt denies abdominal pain, chest pain, any other complaints at this time.   Posture Pt has noticed that she has been bending at the waist more often. She states that she tries to correct her posture but notices that she is bending again within 2 minutes of correction. Pt will order a back brace with metal plates that she saw advertised on television.   Knot on Foot Pt has noticed a knot to the bottom of her L foot. She denies associated foot pain.   R Ear Pt had an eustachian tube place in her R ear by Dr. Lucia Gaskins with ENT in August/September 2015. Pt had tube removed following blood on her pillow one morning upon waking. Pt was prescribed antibiotics which she completed. Pt states that her hearing has improved.   Hyperlipidemia  Pt suspects that her A1C will be elevated since she has been eating poorly.   Urology  Pt was seen at [facility] by Dr. Louis Meckel. Pt's next appointment will be in 2 years.  Preventative Maintenance  Pt's mammogram and colonoscopy are UTD. Pt's last pap smear was 09/2013; normal. Pt saw eye doctor in January 2016 with reports of borderline pressure. She will follow-up with him in July 2016.   Immunizations Pt received pneumovax in 2009 but she has not yet received prevnar. Last tetanus was in 2009.  Past Medical History  Diagnosis Date  . Cancer   . GERD (gastroesophageal reflux disease)   . Hyperlipidemia   . Hypertension   . Allergy   . Cataract    Current Outpatient  Prescriptions on File Prior to Visit  Medication Sig Dispense Refill  . aspirin 81 MG tablet Take 81 mg by mouth daily.      . Hypromellose (GENTEAL MILD OP) Apply to eye as needed.    Marland Kitchen imipramine (TOFRANIL) 25 MG tablet TAKE 3 TABLETS (75 MG TOTAL) BY MOUTH AT BEDTIME. 180 tablet 0  . imipramine (TOFRANIL) 25 MG tablet TAKE 3 TABLETS (75 MG TOTAL) BY MOUTH AT BEDTIME. 270 tablet 0  . omeprazole (PRILOSEC) 40 MG capsule TAKE 1 CAPSULE BY MOUTH  DAILY 90 capsule 1  . OVER THE COUNTER MEDICATION OTC Ibuprofen 200 mg taking    . simvastatin (ZOCOR) 40 MG tablet TAKE 1 TABLET (40 MG TOTAL) BY MOUTH DAILY AT BEDTIME. 30 tablet 2  . valsartan (DIOVAN) 40 MG tablet TAKE 1 TABLET (40 MG TOTAL) BY MOUTH DAILY. 90 tablet 0  . fluticasone (FLONASE) 50 MCG/ACT nasal spray Use 2 sprays in each  nostril daily (Patient not taking: Reported on 01/12/2015) 48 g 2  . HYDROcodone-homatropine (HYCODAN) 5-1.5 MG/5ML syrup Take 5 mLs by mouth every 8 (eight) hours as needed for cough. (Patient not taking: Reported on 01/12/2015) 120 mL 0  . Red Yeast Rice Extract (RED YEAST RICE PO) Take by mouth.    . traMADol (ULTRAM) 50 MG tablet Take 1 tablet (50 mg total) by mouth every 8 (eight)  hours as needed. (Patient not taking: Reported on 01/12/2015) 20 tablet 0  . triamcinolone cream (KENALOG) 0.1 % Apply 1 application topically 2 (two) times daily.     No current facility-administered medications on file prior to visit.   Allergies  Allergen Reactions  . Penicillins   . Sulfur    Review of Systems  Cardiovascular: Negative for chest pain.  Gastrointestinal: Negative for abdominal pain.      Objective:   Physical Exam CONSTITUTIONAL: Well developed/well nourished HEAD: Normocephalic/atraumatic EYES: EOMI/PERRL ENMT: Mucous membranes moist NECK: supple no meningeal signs SPINE/BACK:entire spine nontender CV: S1/S2 noted, no murmurs/rubs/gallops noted LUNGS: Lungs are clear to auscultation bilaterally, no  apparent distress ABDOMEN: soft, nontender, no rebound or guarding, bowel sounds noted throughout abdomen GU:no cva tenderness, labia is normal, no adnexa masses palpable  NEURO: Pt is awake/alert/appropriate, moves all extremitiesx4.  No facial droop.   EXTREMITIES: pulses normal/equal, full ROM SKIN: warm, color normal PSYCH: no abnormalities of mood noted, alert and oriented to situation    Results for orders placed or performed in visit on 01/12/15  POCT glycosylated hemoglobin (Hb A1C)  Result Value Ref Range   Hemoglobin A1C 5.9   POCT glucose (manual entry)  Result Value Ref Range   POC Glucose 114 (A) 70 - 99 mg/dl    Assessment & Plan:  Patient is doing well. There will be no change in her medications. A1c is stable at 5.9. She is up-to-date on immunizations.I personally performed the services described in this documentation, which was scribed in my presence. The recorded information has been reviewed and is accurate.

## 2015-01-13 ENCOUNTER — Other Ambulatory Visit: Payer: Self-pay | Admitting: Emergency Medicine

## 2015-01-13 ENCOUNTER — Other Ambulatory Visit: Payer: Self-pay | Admitting: Physician Assistant

## 2015-01-14 LAB — HEPATITIS C ANTIBODY: HCV Ab: NEGATIVE

## 2015-01-14 NOTE — Addendum Note (Signed)
Addended by: Constance Goltz on: 01/14/2015 09:06 AM   Modules accepted: Miquel Dunn

## 2015-01-19 ENCOUNTER — Other Ambulatory Visit: Payer: Self-pay | Admitting: Emergency Medicine

## 2015-02-11 ENCOUNTER — Other Ambulatory Visit: Payer: Self-pay | Admitting: Emergency Medicine

## 2015-03-02 ENCOUNTER — Ambulatory Visit (INDEPENDENT_AMBULATORY_CARE_PROVIDER_SITE_OTHER): Payer: Medicare Other | Admitting: Internal Medicine

## 2015-03-02 VITALS — BP 120/74 | HR 90 | Temp 98.5°F | Resp 16 | Wt 172.0 lb

## 2015-03-02 DIAGNOSIS — J40 Bronchitis, not specified as acute or chronic: Secondary | ICD-10-CM

## 2015-03-02 DIAGNOSIS — R05 Cough: Secondary | ICD-10-CM | POA: Diagnosis not present

## 2015-03-02 DIAGNOSIS — R509 Fever, unspecified: Secondary | ICD-10-CM | POA: Diagnosis not present

## 2015-03-02 DIAGNOSIS — R059 Cough, unspecified: Secondary | ICD-10-CM

## 2015-03-02 MED ORDER — AZITHROMYCIN 500 MG PO TABS: 500.0000 mg | ORAL_TABLET | Freq: Every day | ORAL | Status: DC

## 2015-03-02 MED ORDER — HYDROCODONE-HOMATROPINE 5-1.5 MG/5ML PO SYRP: 5.0000 mL | ORAL_SOLUTION | Freq: Three times a day (TID) | ORAL | Status: DC | PRN

## 2015-03-02 NOTE — Progress Notes (Signed)
   Subjective:    Patient ID: Tracey Richard, female    DOB: 1941/08/23, 74 y.o.   MRN: [id number]  HPI  Having fever of 101 at home.  She is having some shortness of breath at times.  Coughing with some sputum. Denies any chest pain.  Sputum yellow-green, has been sick over 1 week, progressing. No cp, no heart issues.  Review of Systems     Objective:   Physical Exam  Constitutional: She is oriented to person, place, and time. She appears well-developed and well-nourished. No distress.  HENT:  Head: Normocephalic.  Right Ear: External ear normal.  Left Ear: External ear normal.  Nose: Mucosal edema and rhinorrhea present. Right sinus exhibits no maxillary sinus tenderness and no frontal sinus tenderness. Left sinus exhibits no maxillary sinus tenderness and no frontal sinus tenderness.  Mouth/Throat: Oropharynx is clear and moist.  Eyes: EOM are normal.  Neck: Normal range of motion.  Cardiovascular: Normal rate, regular rhythm and normal heart sounds.   Pulmonary/Chest: Effort normal. No accessory muscle usage. No tachypnea. She has decreased breath sounds. She has wheezes. She has rhonchi. She has no rales.  Musculoskeletal: Normal range of motion.  Neurological: She is alert and oriented to person, place, and time. She exhibits normal muscle tone. Coordination normal.  Psychiatric: She has a normal mood and affect.           Assessment & Plan:  Bronchitis

## 2015-03-02 NOTE — Patient Instructions (Signed)
Fever, Adult A fever is a higher than normal body temperature. In an adult, an oral temperature around 98.6 F (37 C) is considered normal. A temperature of 100.4 F (38 C) or higher is generally considered a fever. Mild or moderate fevers generally have no long-term effects and often do not require treatment. Extreme fever (greater than or equal to 106 F or 41.1 C) can cause seizures. The sweating that may occur with repeated or prolonged fever may cause dehydration. Elderly people can develop confusion during a fever. A measured temperature can vary with:  Age.  Time of day.  Method of measurement (mouth, underarm, rectal, or ear). The fever is confirmed by taking a temperature with a thermometer. Temperatures can be taken different ways. Some methods are accurate and some are not.  An oral temperature is used most commonly. Electronic thermometers are fast and accurate.  An ear temperature will only be accurate if the thermometer is positioned as recommended by the manufacturer.  A rectal temperature is accurate and done for those adults who have a condition where an oral temperature cannot be taken.  An underarm (axillary) temperature is not accurate and not recommended. Fever is a symptom, not a disease.  CAUSES   Infections commonly cause fever.  Some noninfectious causes for fever include:  Some arthritis conditions.  Some thyroid or adrenal gland conditions.  Some immune system conditions.  Some types of cancer.  A medicine reaction.  High doses of certain street drugs such as methamphetamine.  Dehydration.  Exposure to high outside or room temperatures.  Occasionally, the source of a fever cannot be determined. This is sometimes called a "fever of unknown origin" (FUO).  Some situations may lead to a temporary rise in body temperature that may go away on its own. Examples are:  Childbirth.  Surgery.  Intense exercise. HOME CARE INSTRUCTIONS   Take  appropriate medicines for fever. Follow dosing instructions carefully. If you use acetaminophen to reduce the fever, be careful to avoid taking other medicines that also contain acetaminophen. Do not take aspirin for a fever if you are younger than age 61. There is an association with Reye's syndrome. Reye's syndrome is a rare but potentially deadly disease.  If an infection is present and antibiotics have been prescribed, take them as directed. Finish them even if you start to feel better.  Rest as needed.  Maintain an adequate fluid intake. To prevent dehydration during an illness with prolonged or recurrent fever, you may need to drink extra fluid.Drink enough fluids to keep your urine clear or pale yellow.  Sponging or bathing with room temperature water may help reduce body temperature. Do not use ice water or alcohol sponge baths.  Dress comfortably, but do not over-bundle. SEEK MEDICAL CARE IF:   You are unable to keep fluids down.  You develop vomiting or diarrhea.  You are not feeling at least partly better after 3 days.  You develop new symptoms or problems. SEEK IMMEDIATE MEDICAL CARE IF:   You have shortness of breath or trouble breathing.  You develop excessive weakness.  You are dizzy or you faint.  You are extremely thirsty or you are making little or no urine.  You develop new pain that was not there before (such as in the head, neck, chest, back, or abdomen).  You have persistent vomiting and diarrhea for more than 1 to 2 days.  You develop a stiff neck or your eyes become sensitive to light.  You develop a  skin rash.  You have a fever or persistent symptoms for more than 2 to 3 days.  You have a fever and your symptoms suddenly get worse. MAKE SURE YOU:   Understand these instructions.  Will watch your condition.  Will get help right away if you are not doing well or get worse. Document Released: 04/04/2001 Document Revised: 02/23/2014 Document  Reviewed: 08/10/2011 [facility] Patient Information 2015 [place], [place]. This information is not intended to replace advice given to you by your health care provider. Make sure you discuss any questions you have with your health care provider. Cough, Adult  A cough is a reflex that helps clear your throat and airways. It can help heal the body or may be a reaction to an irritated airway. A cough may only last 2 or 3 weeks (acute) or may last more than 8 weeks (chronic).  CAUSES Acute cough:  Viral or bacterial infections. Chronic cough:  Infections.  Allergies.  Asthma.  Post-nasal drip.  Smoking.  Heartburn or acid reflux.  Some medicines.  Chronic lung problems (COPD).  Cancer. SYMPTOMS   Cough.  Fever.  Chest pain.  Increased breathing rate.  High-pitched whistling sound when breathing (wheezing).  Colored mucus that you cough up (sputum). TREATMENT   A bacterial cough may be treated with antibiotic medicine.  A viral cough must run its course and will not respond to antibiotics.  Your caregiver may recommend other treatments if you have a chronic cough. HOME CARE INSTRUCTIONS   Only take over-the-counter or prescription medicines for pain, discomfort, or fever as directed by your caregiver. Use cough suppressants only as directed by your caregiver.  Use a cold steam vaporizer or humidifier in your bedroom or home to help loosen secretions.  Sleep in a semi-upright position if your cough is worse at night.  Rest as needed.  Stop smoking if you smoke. SEEK IMMEDIATE MEDICAL CARE IF:   You have pus in your sputum.  Your cough starts to worsen.  You cannot control your cough with suppressants and are losing sleep.  You begin coughing up blood.  You have difficulty breathing.  You develop pain which is getting worse or is uncontrolled with medicine.  You have a fever. MAKE SURE YOU:   Understand these instructions.  Will watch your  condition.  Will get help right away if you are not doing well or get worse. Document Released: 04/07/2011 Document Revised: 01/01/2012 Document Reviewed: 04/07/2011 [facility] Patient Information 2015 [place], [place]. This information is not intended to replace advice given to you by your health care provider. Make sure you discuss any questions you have with your health care provider.

## 2015-03-10 ENCOUNTER — Ambulatory Visit (INDEPENDENT_AMBULATORY_CARE_PROVIDER_SITE_OTHER): Payer: Medicare Other | Admitting: Family Medicine

## 2015-03-10 ENCOUNTER — Other Ambulatory Visit: Payer: Self-pay | Admitting: Internal Medicine

## 2015-03-10 VITALS — BP 122/68 | HR 86 | Temp 98.0°F | Resp 16 | Ht 63.0 in | Wt 173.0 lb

## 2015-03-10 DIAGNOSIS — R05 Cough: Secondary | ICD-10-CM | POA: Diagnosis not present

## 2015-03-10 DIAGNOSIS — J209 Acute bronchitis, unspecified: Secondary | ICD-10-CM

## 2015-03-10 DIAGNOSIS — R059 Cough, unspecified: Secondary | ICD-10-CM

## 2015-03-10 MED ORDER — BENZONATATE 100 MG PO CAPS: 100.0000 mg | ORAL_CAPSULE | Freq: Three times a day (TID) | ORAL | Status: DC | PRN

## 2015-03-10 NOTE — Patient Instructions (Signed)
Saline nasal spray atleast 4 times per day, over the counter mucinex or mucinex DM during day for cough (OR tessalon perles up to three times per day).  Ok to continue hydrocodone cough syrup at night if needed for cough suppression.  Drink plenty of fluids. If cough not continuing to improve in next week to 10 days, or any worsening sooner - return for recheck.   Return to the clinic or go to the nearest emergency room if any of your symptoms worsen or new symptoms occur.  Cough, Adult  A cough is a reflex that helps clear your throat and airways. It can help heal the body or may be a reaction to an irritated airway. A cough may only last 2 or 3 weeks (acute) or may last more than 8 weeks (chronic).  CAUSES Acute cough:  Viral or bacterial infections. Chronic cough:  Infections.  Allergies.  Asthma.  Post-nasal drip.  Smoking.  Heartburn or acid reflux.  Some medicines.  Chronic lung problems (COPD).  Cancer. SYMPTOMS   Cough.  Fever.  Chest pain.  Increased breathing rate.  High-pitched whistling sound when breathing (wheezing).  Colored mucus that you cough up (sputum). TREATMENT   A bacterial cough may be treated with antibiotic medicine.  A viral cough must run its course and will not respond to antibiotics.  Your caregiver may recommend other treatments if you have a chronic cough. HOME CARE INSTRUCTIONS   Only take over-the-counter or prescription medicines for pain, discomfort, or fever as directed by your caregiver. Use cough suppressants only as directed by your caregiver.  Use a cold steam vaporizer or humidifier in your bedroom or home to help loosen secretions.  Sleep in a semi-upright position if your cough is worse at night.  Rest as needed.  Stop smoking if you smoke. SEEK IMMEDIATE MEDICAL CARE IF:   You have pus in your sputum.  Your cough starts to worsen.  You cannot control your cough with suppressants and are losing sleep.  You  begin coughing up blood.  You have difficulty breathing.  You develop pain which is getting worse or is uncontrolled with medicine.  You have a fever. MAKE SURE YOU:   Understand these instructions.  Will watch your condition.  Will get help right away if you are not doing well or get worse. Document Released: 04/07/2011 Document Revised: 01/01/2012 Document Reviewed: 04/07/2011 [facility] Patient Information 2015 [place], [place]. This information is not intended to replace advice given to you by your health care provider. Make sure you discuss any questions you have with your health care provider.   Acute Bronchitis Bronchitis is inflammation of the airways that extend from the windpipe into the lungs (bronchi). The inflammation often causes mucus to develop. This leads to a cough, which is the most common symptom of bronchitis.  In acute bronchitis, the condition usually develops suddenly and goes away over time, usually in a couple weeks. Smoking, allergies, and asthma can make bronchitis worse. Repeated episodes of bronchitis may cause further lung problems.  CAUSES Acute bronchitis is most often caused by the same virus that causes a cold. The virus can spread from person to person (contagious) through coughing, sneezing, and touching contaminated objects. SIGNS AND SYMPTOMS   Cough.   Fever.   Coughing up mucus.   Body aches.   Chest congestion.   Chills.   Shortness of breath.   Sore throat.  DIAGNOSIS  Acute bronchitis is usually diagnosed through a physical exam. Your  health care provider will also ask you questions about your medical history. Tests, such as chest X-rays, are sometimes done to rule out other conditions.  TREATMENT  Acute bronchitis usually goes away in a couple weeks. Oftentimes, no medical treatment is necessary. Medicines are sometimes given for relief of fever or cough. Antibiotic medicines are usually not needed but may be prescribed in  certain situations. In some cases, an inhaler may be recommended to help reduce shortness of breath and control the cough. A cool mist vaporizer may also be used to help thin bronchial secretions and make it easier to clear the chest.  HOME CARE INSTRUCTIONS  Get plenty of rest.   Drink enough fluids to keep your urine clear or pale yellow (unless you have a medical condition that requires fluid restriction). Increasing fluids may help thin your respiratory secretions (sputum) and reduce chest congestion, and it will prevent dehydration.   Take medicines only as directed by your health care provider.  If you were prescribed an antibiotic medicine, finish it all even if you start to feel better.  Avoid smoking and secondhand smoke. Exposure to cigarette smoke or irritating chemicals will make bronchitis worse. If you are a smoker, consider using nicotine gum or skin patches to help control withdrawal symptoms. Quitting smoking will help your lungs heal faster.   Reduce the chances of another bout of acute bronchitis by washing your hands frequently, avoiding people with cold symptoms, and trying not to touch your hands to your mouth, nose, or eyes.   Keep all follow-up visits as directed by your health care provider.  SEEK MEDICAL CARE IF: Your symptoms do not improve after 1 week of treatment.  SEEK IMMEDIATE MEDICAL CARE IF:  You develop an increased fever or chills.   You have chest pain.   You have severe shortness of breath.  You have bloody sputum.   You develop dehydration.  You faint or repeatedly feel like you are going to pass out.  You develop repeated vomiting.  You develop a severe headache. MAKE SURE YOU:   Understand these instructions.  Will watch your condition.  Will get help right away if you are not doing well or get worse. Document Released: 11/16/2004 Document Revised: 02/23/2014 Document Reviewed: 04/01/2013 [facility] Patient Information 2015  [place], [place]. This information is not intended to replace advice given to you by your health care provider. Make sure you discuss any questions you have with your health care provider.

## 2015-03-10 NOTE — Progress Notes (Signed)
Subjective:    Patient ID: Tracey Richard, female    DOB: Dec 27, 1940, 74 y.o.   MRN: [id number] This chart was scribed for Tracey Ray, MD by Girtha Hake, ED Scribe. The patient was seen in Room 13. The patient's care was started at 5:57 PM.   HPI Tracey Richard is a 74 y.o. Female who is here for a follow-up. She was last seen 8 days ago by Dr. Elder Cyphers for bronchitis with productive cough and a temperature of 101.6 at home. She was treated with azitrhoymycin 500 mg QD for 5 days and Hycodan cough syrup.    Patient still complains of a productive cough, but reports that it has improved. She reports that she is taking the Hycodan cough syrup twice daily. She states that her other symptoms have also improved as she has taken the azithromycin QD. Denies fever, SOB, CP. She denies history of CHF, asthma, chronic breathing problems, or smoking.   PCP is Dr. Everlene Farrier.  Patient Active Problem List   Diagnosis Date Noted  . Falls frequently 11/14/2013  . HTN (hypertension) 11/14/2013  . Dyslipidemia 11/14/2013  . Diabetes 11/19/2012  . 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 Cancer   . GERD (gastroesophageal reflux disease)   . Hyperlipidemia   . Hypertension   . Allergy   . Cataract    Past Surgical History  Procedure Laterality Date  . Tonsillectomy  1959  . Back surgery  2007  . Appendectomy  1963  . [place]  . Breast surgery    . Spine surgery    . Eye surgery  1999    bilateral cataract extraction  . Dilation and curettage of uterus     Allergies  Allergen Reactions  . Penicillins   . Sulfur    Prior to Admission medications   Medication Sig Start Date End Date Taking? Authorizing Provider  aspirin 81 MG tablet Take 81 mg by mouth daily.     Yes Historical Provider, MD  HYDROcodone-homatropine (HYCODAN) 5-1.5 MG/5ML syrup Take 5 mLs by mouth every 8 (eight) hours as needed for cough. 03/02/15  Yes Orma Flaming, MD  Hypromellose ([place] [facility]) Apply to eye as needed.   Yes Historical Provider, MD  imipramine (TOFRANIL) 25 MG tablet TAKE 3 TABLETS (75 MG TOTAL) BY MOUTH AT BEDTIME. 08/01/14  Yes Mancel Bale, PA-C  imipramine (TOFRANIL) 25 MG tablet TAKE 3 TABLETS (75 MG TOTAL) BY MOUTH AT BEDTIME. 01/14/15  Yes [name], MD  omeprazole (PRILOSEC) 40 MG capsule TAKE 1 CAPSULE BY MOUTH  DAILY 01/19/15  Yes [name], MD  OVER THE COUNTER MEDICATION OTC Ibuprofen 200 mg taking   Yes Historical Provider, MD  OVER THE COUNTER MEDICATION OTC Centrum Silver multi vitamin   Yes Historical Provider, MD  OVER THE COUNTER MEDICATION OTC fiber gummy bears 2 a day   Yes Historical Provider, MD  Red Yeast Rice Extract (RED YEAST RICE PO) Take by mouth.   Yes Historical Provider, MD  simvastatin (ZOCOR) 40 MG tablet TAKE 1 TABLET (40 MG TOTAL) BY MOUTH DAILY AT BEDTIME. 02/11/15  Yes [name], MD  triamcinolone cream (KENALOG) 0.1 % Apply 1 application topically 2 (two) times daily.   Yes Historical Provider, MD  valsartan (DIOVAN) 40 MG tablet TAKE 1 TABLET (40 MG TOTAL) BY MOUTH DAILY. 01/14/15  Yes [name], MD  azithromycin [facility])  500 MG tablet Take 1 tablet (500 mg total) by mouth daily. Patient not taking: Reported on 03/10/2015 03/02/15   Orma Flaming, MD  fluticasone [facility]) 50 MCG/ACT nasal spray Use 2 sprays in each  nostril daily Patient not taking: Reported on 01/12/2015 08/12/14   [name], MD  traMADol (ULTRAM) 50 MG tablet Take 1 tablet (50 mg total) by mouth every 8 (eight) hours as needed. Patient not taking: Reported on 01/12/2015 11/06/13   Melony Overly, MD   History   Social History  . Marital Status: Single    Spouse Name: N/A  . Number of Children: N/A  . Years of Education: N/A   Occupational History  . retired    Social History Main Topics  . Smoking status: Never Smoker   . Smokeless tobacco: Never Used  . Alcohol Use: No  . Drug Use: No  .  Sexual Activity: No   Other Topics Concern  . Not on file   Social History Narrative   Walks daily. Divorced. Education: [employer].       Review of Systems  Constitutional: Negative for fever.  Respiratory: Positive for cough. Negative for shortness of breath.   Cardiovascular: Negative for chest pain.       Objective:   Physical Exam  Constitutional: She is oriented to person, place, and time. She appears well-developed and well-nourished. No distress.  HENT:  Head: Normocephalic and atraumatic.  Right Ear: Hearing, tympanic membrane, external ear and ear canal normal.  Left Ear: Hearing, tympanic membrane, external ear and ear canal normal.  Nose: Nose normal.  Mouth/Throat: Oropharynx is clear and moist. No oropharyngeal exudate.  Eyes: Conjunctivae and EOM are normal. Pupils are equal, round, and reactive to light.  Cardiovascular: Normal rate, regular rhythm, normal heart sounds and intact distal pulses.   No murmur heard. Pulmonary/Chest: Effort normal and breath sounds normal. No respiratory distress. She has no wheezes. She has no rhonchi. She has no rales.  Neurological: She is alert and oriented to person, place, and time.  Skin: Skin is warm and dry. No rash noted.  Psychiatric: She has a normal mood and affect. Her behavior is normal.  Vitals reviewed.  Filed Vitals:   03/10/15 1634  BP: 122/68  Pulse: 86  Temp: 98 F (36.7 C)  TempSrc: Oral  Resp: 16  Height: 5\' 3"  (1.6 m)  Weight: 173 lb (78.472 kg)  SpO2: 97%           Assessment & Plan:   Tracey Richard is a 74 y.o. female Cough - Plan: benzonatate (TESSALON) 100 MG capsule  Acute bronchitis, unspecified organism - Plan: benzonatate (TESSALON) 100 MG capsule  Improving bronchitis, with some residual postinfectious cough. Reassuring exam.   - can use sx care with mucinex or tessalon during the day, Hycodan if needed at night - SED, and rtc precautions.    Meds ordered this  encounter  Medications  . benzonatate (TESSALON) 100 MG capsule    Sig: Take 1 capsule (100 mg total) by mouth 3 (three) times daily as needed for cough.    Dispense:  20 capsule    Refill:  0   Patient Instructions  Saline nasal spray atleast 4 times per day, over the counter mucinex or mucinex DM during day for cough (OR tessalon perles up to three times per day).  Ok to continue hydrocodone cough syrup at night if needed for cough suppression.  Drink plenty of fluids. If cough  not continuing to improve in next week to 10 days, or any worsening sooner - return for recheck.   Return to the clinic or go to the nearest emergency room if any of your symptoms worsen or new symptoms occur.  Cough, Adult  A cough is a reflex that helps clear your throat and airways. It can help heal the body or may be a reaction to an irritated airway. A cough may only last 2 or 3 weeks (acute) or may last more than 8 weeks (chronic).  CAUSES Acute cough:  Viral or bacterial infections. Chronic cough:  Infections.  Allergies.  Asthma.  Post-nasal drip.  Smoking.  Heartburn or acid reflux.  Some medicines.  Chronic lung problems (COPD).  Cancer. SYMPTOMS   Cough.  Fever.  Chest pain.  Increased breathing rate.  High-pitched whistling sound when breathing (wheezing).  Colored mucus that you cough up (sputum). TREATMENT   A bacterial cough may be treated with antibiotic medicine.  A viral cough must run its course and will not respond to antibiotics.  Your caregiver may recommend other treatments if you have a chronic cough. HOME CARE INSTRUCTIONS   Only take over-the-counter or prescription medicines for pain, discomfort, or fever as directed by your caregiver. Use cough suppressants only as directed by your caregiver.  Use a cold steam vaporizer or humidifier in your bedroom or home to help loosen secretions.  Sleep in a semi-upright position if your cough is worse at  night.  Rest as needed.  Stop smoking if you smoke. SEEK IMMEDIATE MEDICAL CARE IF:   You have pus in your sputum.  Your cough starts to worsen.  You cannot control your cough with suppressants and are losing sleep.  You begin coughing up blood.  You have difficulty breathing.  You develop pain which is getting worse or is uncontrolled with medicine.  You have a fever. MAKE SURE YOU:   Understand these instructions.  Will watch your condition.  Will get help right away if you are not doing well or get worse. Document Released: 04/07/2011 Document Revised: 01/01/2012 Document Reviewed: 04/07/2011 [facility] Patient Information 2015 [place], [place]. This information is not intended to replace advice given to you by your health care provider. Make sure you discuss any questions you have with your health care provider.   Acute Bronchitis Bronchitis is inflammation of the airways that extend from the windpipe into the lungs (bronchi). The inflammation often causes mucus to develop. This leads to a cough, which is the most common symptom of bronchitis.  In acute bronchitis, the condition usually develops suddenly and goes away over time, usually in a couple weeks. Smoking, allergies, and asthma can make bronchitis worse. Repeated episodes of bronchitis may cause further lung problems.  CAUSES Acute bronchitis is most often caused by the same virus that causes a cold. The virus can spread from person to person (contagious) through coughing, sneezing, and touching contaminated objects. SIGNS AND SYMPTOMS   Cough.   Fever.   Coughing up mucus.   Body aches.   Chest congestion.   Chills.   Shortness of breath.   Sore throat.  DIAGNOSIS  Acute bronchitis is usually diagnosed through a physical exam. Your health care provider will also ask you questions about your medical history. Tests, such as chest X-rays, are sometimes done to rule out other conditions.  TREATMENT   Acute bronchitis usually goes away in a couple weeks. Oftentimes, no medical treatment is necessary. Medicines are sometimes given for  relief of fever or cough. Antibiotic medicines are usually not needed but may be prescribed in certain situations. In some cases, an inhaler may be recommended to help reduce shortness of breath and control the cough. A cool mist vaporizer may also be used to help thin bronchial secretions and make it easier to clear the chest.  HOME CARE INSTRUCTIONS  Get plenty of rest.   Drink enough fluids to keep your urine clear or pale yellow (unless you have a medical condition that requires fluid restriction). Increasing fluids may help thin your respiratory secretions (sputum) and reduce chest congestion, and it will prevent dehydration.   Take medicines only as directed by your health care provider.  If you were prescribed an antibiotic medicine, finish it all even if you start to feel better.  Avoid smoking and secondhand smoke. Exposure to cigarette smoke or irritating chemicals will make bronchitis worse. If you are a smoker, consider using nicotine gum or skin patches to help control withdrawal symptoms. Quitting smoking will help your lungs heal faster.   Reduce the chances of another bout of acute bronchitis by washing your hands frequently, avoiding people with cold symptoms, and trying not to touch your hands to your mouth, nose, or eyes.   Keep all follow-up visits as directed by your health care provider.  SEEK MEDICAL CARE IF: Your symptoms do not improve after 1 week of treatment.  SEEK IMMEDIATE MEDICAL CARE IF:  You develop an increased fever or chills.   You have chest pain.   You have severe shortness of breath.  You have bloody sputum.   You develop dehydration.  You faint or repeatedly feel like you are going to pass out.  You develop repeated vomiting.  You develop a severe headache. MAKE SURE YOU:   Understand these  instructions.  Will watch your condition.  Will get help right away if you are not doing well or get worse. Document Released: 11/16/2004 Document Revised: 02/23/2014 Document Reviewed: 04/01/2013 [facility] Patient Information 2015 [place], [place]. This information is not intended to replace advice given to you by your health care provider. Make sure you discuss any questions you have with your health care provider.      I personally performed the services described in this documentation, which was scribed in my presence. The recorded information has been reviewed and considered, and addended by me as needed.

## 2015-03-22 ENCOUNTER — Other Ambulatory Visit: Payer: Self-pay

## 2015-03-22 MED ORDER — SIMVASTATIN 40 MG PO TABS: ORAL_TABLET | ORAL | Status: DC

## 2015-04-06 DIAGNOSIS — M810 Age-related osteoporosis without current pathological fracture: Secondary | ICD-10-CM | POA: Diagnosis not present

## 2015-04-12 ENCOUNTER — Other Ambulatory Visit: Payer: Self-pay | Admitting: Emergency Medicine

## 2015-04-19 ENCOUNTER — Other Ambulatory Visit: Payer: Self-pay

## 2015-04-21 ENCOUNTER — Encounter (HOSPITAL_COMMUNITY): Payer: Self-pay | Admitting: Emergency Medicine

## 2015-04-21 ENCOUNTER — Inpatient Hospital Stay (HOSPITAL_COMMUNITY)
Admission: EM | Admit: 2015-04-21 | Discharge: 2015-04-23 | DRG: 064 | Disposition: A | Discharge status: Home / Self Care | Payer: Medicare Other | Attending: Neurology | Admitting: Neurology

## 2015-04-21 ENCOUNTER — Emergency Department (HOSPITAL_COMMUNITY): Payer: Medicare Other

## 2015-04-21 DIAGNOSIS — I612 Nontraumatic intracerebral hemorrhage in hemisphere, unspecified: Secondary | ICD-10-CM | POA: Diagnosis not present

## 2015-04-21 DIAGNOSIS — I629 Nontraumatic intracranial hemorrhage, unspecified: Secondary | ICD-10-CM | POA: Diagnosis not present

## 2015-04-21 DIAGNOSIS — Z79899 Other long term (current) drug therapy: Secondary | ICD-10-CM

## 2015-04-21 DIAGNOSIS — Z882 Allergy status to sulfonamides status: Secondary | ICD-10-CM

## 2015-04-21 DIAGNOSIS — I619 Nontraumatic intracerebral hemorrhage, unspecified: Secondary | ICD-10-CM | POA: Diagnosis present

## 2015-04-21 DIAGNOSIS — M6281 Muscle weakness (generalized): Secondary | ICD-10-CM | POA: Diagnosis not present

## 2015-04-21 DIAGNOSIS — Z880 Allergy status to penicillin: Secondary | ICD-10-CM | POA: Diagnosis not present

## 2015-04-21 DIAGNOSIS — I6789 Other cerebrovascular disease: Secondary | ICD-10-CM | POA: Diagnosis not present

## 2015-04-21 DIAGNOSIS — R278 Other lack of coordination: Secondary | ICD-10-CM | POA: Diagnosis present

## 2015-04-21 DIAGNOSIS — I639 Cerebral infarction, unspecified: Secondary | ICD-10-CM

## 2015-04-21 DIAGNOSIS — I10 Essential (primary) hypertension: Secondary | ICD-10-CM | POA: Diagnosis present

## 2015-04-21 DIAGNOSIS — R29898 Other symptoms and signs involving the musculoskeletal system: Secondary | ICD-10-CM

## 2015-04-21 DIAGNOSIS — I610 Nontraumatic intracerebral hemorrhage in hemisphere, subcortical: Secondary | ICD-10-CM | POA: Diagnosis not present

## 2015-04-21 DIAGNOSIS — R531 Weakness: Secondary | ICD-10-CM | POA: Diagnosis present

## 2015-04-21 DIAGNOSIS — Z833 Family history of diabetes mellitus: Secondary | ICD-10-CM

## 2015-04-21 DIAGNOSIS — G936 Cerebral edema: Secondary | ICD-10-CM | POA: Diagnosis not present

## 2015-04-21 DIAGNOSIS — E785 Hyperlipidemia, unspecified: Secondary | ICD-10-CM | POA: Diagnosis present

## 2015-04-21 DIAGNOSIS — K219 Gastro-esophageal reflux disease without esophagitis: Secondary | ICD-10-CM | POA: Diagnosis present

## 2015-04-21 DIAGNOSIS — I671 Cerebral aneurysm, nonruptured: Secondary | ICD-10-CM | POA: Diagnosis not present

## 2015-04-21 DIAGNOSIS — R29810 Facial weakness: Secondary | ICD-10-CM | POA: Diagnosis not present

## 2015-04-21 LAB — BASIC METABOLIC PANEL
ANION GAP: 5 (ref 5–15)
BUN: 13 mg/dL (ref 6–20)
CO2: 32 mmol/L (ref 22–32)
Calcium: 9.1 mg/dL (ref 8.9–10.3)
Chloride: 104 mmol/L (ref 101–111)
Creatinine, Ser: 1.07 mg/dL — ABNORMAL HIGH (ref 0.44–1.00)
GFR calc Af Amer: 59 mL/min — ABNORMAL LOW (ref >60)
GFR, EST NON AFRICAN AMERICAN: 51 mL/min — AB (ref >60)
GLUCOSE: 93 mg/dL (ref 65–99)
Potassium: 3.8 mmol/L (ref 3.5–5.1)
Sodium: 141 mmol/L (ref 135–145)

## 2015-04-21 LAB — CBC
HEMATOCRIT: 37.8 % (ref 36.0–46.0)
Hemoglobin: 12 g/dL (ref 12.0–15.0)
MCH: 27.8 pg (ref 26.0–34.0)
MCHC: 31.7 g/dL (ref 30.0–36.0)
MCV: 87.5 fL (ref 78.0–100.0)
Platelets: 248 10*3/uL (ref 150–400)
RBC: 4.32 MIL/uL (ref 3.87–5.11)
RDW: 13.5 % (ref 11.5–15.5)
WBC: 8 10*3/uL (ref 4.0–10.5)

## 2015-04-21 LAB — PROTIME-INR
INR: 0.99 (ref 0.00–1.49)
PROTHROMBIN TIME: 13.3 s (ref 11.6–15.2)

## 2015-04-21 NOTE — ED Provider Notes (Signed)
CSN: [id number]     Arrival date & time 04/21/15  1956 History   First MD Initiated Contact with Patient 04/21/15 2020     Chief Complaint  Patient presents with  . Weakness     (Consider location/radiation/quality/duration/timing/severity/associated sxs/prior Treatment) HPI Comments: 74 year old female here with some neurologic symptoms. She had some issues dropping female and having right hand weakness earlier. She wasn't aware she was dropping things. She also had some difficulty walking yesterday and she was dragging her right foot earlier today. Her boyfriend said that he thought he noticed some facial droop and some slurred speech, however has resolved  Patient is a 74 y.o. female presenting with weakness. The history is provided by the patient.  Weakness This is a new problem. The current episode started yesterday. Episode frequency: intermittent. The problem has not changed since onset.Pertinent negatives include no abdominal pain and no shortness of breath. Nothing aggravates the symptoms. Nothing relieves the symptoms.    Past Medical History  Diagnosis Date  . Cancer   . GERD (gastroesophageal reflux disease)   . Hyperlipidemia   . Hypertension   . Allergy   . Cataract    Past Surgical History  Procedure Laterality Date  . Tonsillectomy  1959  . Back surgery  2007  . Appendectomy  1963  . [place]  . Breast surgery    . Spine surgery    . Eye surgery  1999    bilateral cataract extraction  . Dilation and curettage of uterus     Family History  Problem Relation Age of Onset  . Cancer Mother     vaginal cancer, also HTN  . Prostate cancer Father     also HTN, dementia  . Diabetes Brother    History  Substance Use Topics  . Smoking status: Never Smoker   . Smokeless tobacco: Never Used  . Alcohol Use: No   OB History    No data available     Review of Systems  Constitutional: Negative for fever.  Respiratory: Negative for cough and shortness of  breath.   Gastrointestinal: Negative for vomiting and abdominal pain.  Neurological: Positive for weakness.  All other systems reviewed and are negative.     Allergies  Penicillins and Sulfur  Home Medications   Prior to Admission medications   Medication Sig Start Date End Date Taking? Authorizing Provider  aspirin 81 MG tablet Take 81 mg by mouth daily.      Historical Provider, MD  azithromycin (ZITHROMAX) 500 MG tablet Take 1 tablet (500 mg total) by mouth daily. Patient not taking: Reported on 03/10/2015 03/02/15   Orma Flaming, MD  benzonatate (TESSALON) 100 MG capsule Take 1 capsule (100 mg total) by mouth 3 (three) times daily as needed for cough. 03/10/15   Wendie Agreste, MD  fluticasone [facility]) 50 MCG/ACT nasal spray Use 2 sprays in each  nostril daily Patient not taking: Reported on 01/12/2015 08/12/14   [name], MD  HYDROcodone-homatropine [facility]) 5-1.5 MG/5ML syrup Take 5 mLs by mouth every 8 (eight) hours as needed for cough. 03/02/15   Orma Flaming, MD  Hypromellose ([place] [facility]) Apply to eye as needed.    Historical Provider, MD  imipramine (TOFRANIL) 25 MG tablet TAKE 3 TABLETS (75 MG TOTAL) BY MOUTH AT BEDTIME. 08/01/14   Mancel Bale, PA-C  imipramine (TOFRANIL) 25 MG tablet TAKE 3 TABLETS (75 MG TOTAL) BY MOUTH AT BEDTIME. 01/14/15   Loura Back  Daub, MD  omeprazole (PRILOSEC) 40 MG capsule TAKE 1 CAPSULE BY MOUTH  DAILY 01/19/15   [name], MD  OVER THE COUNTER MEDICATION OTC Ibuprofen 200 mg taking    Historical Provider, MD  OVER THE COUNTER MEDICATION OTC Centrum Silver multi vitamin    Historical Provider, MD  OVER THE COUNTER MEDICATION OTC fiber gummy bears 2 a day    Historical Provider, MD  Red Yeast Rice Extract (RED YEAST RICE PO) Take by mouth.    Historical Provider, MD  simvastatin (ZOCOR) 40 MG tablet TAKE 1 TABLET (40 MG TOTAL) BY MOUTH DAILY AT BEDTIME. 03/22/15   [name], MD  traMADol (ULTRAM) 50 MG tablet Take 1 tablet (50 mg  total) by mouth every 8 (eight) hours as needed. Patient not taking: Reported on 01/12/2015 11/06/13   Melony Overly, MD  triamcinolone cream (KENALOG) 0.1 % Apply 1 application topically 2 (two) times daily.    Historical Provider, MD  valsartan (DIOVAN) 40 MG tablet TAKE 1 TABLET (40 MG TOTAL) BY MOUTH DAILY. 01/14/15   [name], MD   BP 144/79 mmHg  Pulse 67  Temp(Src) 97.7 F (36.5 C) (Oral)  Resp 18  Ht 5' 3.5" (1.613 m)  Wt 165 lb (74.844 kg)  BMI 28.77 kg/m2  SpO2 99% Physical Exam  Constitutional: She is oriented to person, place, and time. She appears well-developed and well-nourished. No distress.  HENT:  Head: Normocephalic and atraumatic.  Mouth/Throat: Oropharynx is clear and moist.  Eyes: EOM are normal. Pupils are equal, round, and reactive to light.  Neck: Normal range of motion. Neck supple.  Cardiovascular: Normal rate and regular rhythm.  Exam reveals no friction rub.   No murmur heard. Pulmonary/Chest: Effort normal and breath sounds normal. No respiratory distress. She has no wheezes. She has no rales.  Abdominal: Soft. She exhibits no distension. There is no tenderness. There is no rebound.  Musculoskeletal: Normal range of motion. She exhibits no edema.  Neurological: She is alert and oriented to person, place, and time. No cranial nerve deficit. She exhibits normal muscle tone. Coordination normal.  Skin: She is not diaphoretic.  Nursing note and vitals reviewed.   ED Course  Procedures (including critical care time) Labs Review Labs Reviewed - No data to display  Imaging Review Ct Head Wo Contrast  04/21/2015   CLINICAL DATA:  Gait difficulty and right hand weakness for 1 day. Mild headache. Initial encounter.  EXAM: CT HEAD WITHOUT CONTRAST  TECHNIQUE: Contiguous axial images were obtained from the base of the skull through the vertex without intravenous contrast.  COMPARISON:  Head CT 11/10/2013.  FINDINGS: There is a 2.8 x 1.4 cm acute hematoma  within the left putamen and external capsule (image 15). This has an estimated volume of approximately 3.9 ml. There is mild surrounding vasogenic edema. No other evidence of acute intracranial hemorrhage, extra-axial fluid collection, midline shift or hydrocephalus. Mild underlying small vessel ischemic changes are present within the periventricular white matter bilaterally.  The visualized paranasal sinuses, mastoid air cells and middle ears are clear. The calvarium is intact.  IMPRESSION: 1. Acute hemorrhage within the left putamen and external capsule. This may reflect a hypertensive bleed or hemorrhagic stroke. 2. No midline shift or hydrocephalus. 3. Critical Value/emergent results were called by telephone at the time of interpretation on 04/21/2015 at 9:37 pm to Dr. Evelina Bucy , who verbally acknowledged these results.   Electronically Signed   By: Richardean Sale  M.D.   On: 04/21/2015 21:39     EKG Interpretation None      CRITICAL CARE Performed by: Osvaldo Shipper   Total critical care time: 30 minutes  Critical care time was exclusive of separately billable procedures and treating other patients.  Critical care was necessary to treat or prevent imminent or life-threatening deterioration.  Critical care was time spent personally by me on the following activities: development of treatment plan with patient and/or surrogate as well as nursing, discussions with consultants, evaluation of patient's response to treatment, examination of patient, obtaining history from patient or surrogate, ordering and performing treatments and interventions, ordering and review of laboratory studies, ordering and review of radiographic studies, pulse oximetry and re-evaluation of patient's condition.  MDM   Final diagnoses:  Right hand weakness  Intracranial hemorrhage    74 year old female with history of hypertension, hyperlipidemia presents with symptoms concerning for TIA. She had  intermittent difficulty walking yesterday, and today she had some intermittent weakness in her right hand and right foot. Neurologically intact now. Will do TIA workup. Patient's Head CT shows putamen bleed. Patient well appearing, Neurology will admit to the ICU at [facility]. Dr. Doy Mince accepting.  Evelina Bucy, MD 04/22/15 [phone]

## 2015-04-21 NOTE — ED Notes (Signed)
Pt states her symptoms started yesterday afternoon  Pt states she is having difficulty with her gait and dropping things out of her right hand  Pt states he put her slippers on and when she got to the restroom she did not have the one on her right foot and was unaware of it  Pt states she has had a mild headache as well

## 2015-04-21 NOTE — H&P (Addendum)
Admission H&P    Chief Complaint: Dropping things from her right hand HPI: Tracey Richard is an 74 y.o. female who reports that yesterday she awakened normal.  Went for a walk with her friend and noted that she was  Walking as if drunk.  She was going to the right.  She was taken back home and went to bed early which is unusual for her.  Awakened this afternoon and her gait was much better but was noted to drop the mail from her right hand.  Also noted when she went out to dinner that she was dropping her fork.  Presented for evaluation at that time.  Patient is right handed.    Date last known well: Date: 04/19/2015 Time last known well: Time: 16:30 tPA Given: No: ICH  Past Medical History  Diagnosis Date  . Cancer   . GERD (gastroesophageal reflux disease)   . Hyperlipidemia   . Hypertension   . Allergy   . Cataract     Past Surgical History  Procedure Laterality Date  . Tonsillectomy  1959  . Back surgery  2007  . Appendectomy  1963  . [place]  . Breast surgery    . Spine surgery    . Eye surgery  1999    bilateral cataract extraction  . Dilation and curettage of uterus      Family History  Problem Relation Age of Onset  . Cancer Mother     vaginal cancer, also HTN  . Prostate cancer Father     also HTN, dementia  . Diabetes Brother    Social History:  reports that she has never smoked. She has never used smokeless tobacco. She reports that she does not drink alcohol or use illicit drugs.  Allergies:  Allergies  Allergen Reactions  . Penicillins   . Sulfur     Medications Prior to Admission  Medication Sig Dispense Refill  . artificial tears (LACRILUBE) OINT ophthalmic ointment Place 1 application into both eyes 2 (two) times daily.    Marland Kitchen aspirin 81 MG tablet Take 81 mg by mouth daily.      Marland Kitchen ibuprofen (ADVIL,MOTRIN) 200 MG tablet Take 200 mg by mouth every 6 (six) hours as needed for headache (headache).    . imipramine (TOFRANIL) 25 MG tablet TAKE 3  TABLETS (75 MG TOTAL) BY MOUTH AT BEDTIME. 180 tablet 0  . omeprazole (PRILOSEC) 40 MG capsule TAKE 1 CAPSULE BY MOUTH  DAILY 90 capsule 1  . simvastatin (ZOCOR) 40 MG tablet TAKE 1 TABLET (40 MG TOTAL) BY MOUTH DAILY AT BEDTIME. 90 tablet 0  . valsartan (DIOVAN) 40 MG tablet TAKE 1 TABLET (40 MG TOTAL) BY MOUTH DAILY. 90 tablet 1  . azithromycin (ZITHROMAX) 500 MG tablet Take 1 tablet (500 mg total) by mouth daily. (Patient not taking: Reported on 03/10/2015) 5 tablet 0  . benzonatate (TESSALON) 100 MG capsule Take 1 capsule (100 mg total) by mouth 3 (three) times daily as needed for cough. (Patient not taking: Reported on 04/21/2015) 20 capsule 0  . fluticasone (FLONASE) 50 MCG/ACT nasal spray Use 2 sprays in each  nostril daily (Patient not taking: Reported on 01/12/2015) 48 g 2  . HYDROcodone-homatropine (HYCODAN) 5-1.5 MG/5ML syrup Take 5 mLs by mouth every 8 (eight) hours as needed for cough. (Patient not taking: Reported on 04/21/2015) 120 mL 0  . imipramine (TOFRANIL) 25 MG tablet TAKE 3 TABLETS (75 MG TOTAL) BY MOUTH AT BEDTIME. (Patient not taking: Reported  on 04/21/2015) 270 tablet 1  . traMADol (ULTRAM) 50 MG tablet Take 1 tablet (50 mg total) by mouth every 8 (eight) hours as needed. (Patient not taking: Reported on 01/12/2015) 20 tablet 0    ROS: History obtained from the patient  General ROS: negative for - chills, fatigue, fever, night sweats, weight gain or weight loss Psychological ROS: negative for - behavioral disorder, hallucinations, memory difficulties, mood swings or suicidal ideation Ophthalmic ROS: negative for - blurry vision, double vision, eye pain or loss of vision ENT ROS: negative for - epistaxis, nasal discharge, oral lesions, sore throat, tinnitus or vertigo Allergy and Immunology ROS: negative for - hives or itchy/watery eyes Hematological and Lymphatic ROS: negative for - bleeding problems, bruising or swollen lymph nodes Endocrine ROS: negative for - galactorrhea,  hair pattern changes, polydipsia/polyuria or temperature intolerance Respiratory ROS: negative for - cough, hemoptysis, shortness of breath or wheezing Cardiovascular ROS: negative for - chest pain, dyspnea on exertion, edema or irregular heartbeat Gastrointestinal ROS: recent right lower quadrant abdominal pain Genito-Urinary ROS: negative for - dysuria, hematuria, incontinence or urinary frequency/urgency Musculoskeletal ROS: negative for - joint swelling or muscular weakness Neurological ROS: as noted in HPI Dermatological ROS: negative for rash and skin lesion changes  Physical Examination: Blood pressure 173/82, pulse 76, temperature 97.7 F (36.5 C), temperature source Oral, resp. rate 20, height 5' 3.5" (1.613 m), weight 74.844 kg (165 lb), SpO2 99 %.  General Examination:  HEENT-  Normocephalic, no lesions, without obvious abnormality.  Normal external eye and conjunctiva.  Normal TM's bilaterally.  Normal auditory canals and external ears. Normal external nose, mucus membranes and septum.  Normal pharynx. Cardiovascular- S1, S2 normal, pulses palpable throughout   Lungs- chest clear, no wheezing, rales, normal symmetric air entry Abdomen- soft, non-tender; bowel sounds normal; no masses,  no organomegaly Extremities- no edema Lymph-no adenopathy palpable Musculoskeletal-no joint tenderness, deformity or swelling Skin-warm and dry, no hyperpigmentation, vitiligo, or suspicious lesions  Neurological Examination Mental Status: Alert, oriented, thought content appropriate.  Speech fluent without evidence of aphasia.  Able to follow 3 step commands without difficulty. Cranial Nerves: II: Discs flat bilaterally; Visual fields grossly normal, pupils equal, round, reactive to light and accommodation III,IV, VI: ptosis not present, extra-ocular motions intact bilaterally V,VII: mild left facial droop, facial light touch sensation normal bilaterally VIII: hearing normal  bilaterally IX,X: gag reflex present XI: bilateral shoulder shrug XII: midline tongue extension Motor: Right : Upper extremity   5/5    Left:     Upper extremity   5/5  Lower extremity   5/5     Lower extremity   5/5 Tone and bulk:normal tone throughout; no atrophy noted Sensory: Pinprick and light touch intact throughout, bilaterally Deep Tendon Reflexes: 2+ and symmetric throughout Plantars: Right: downgoing   Left: downgoing Cerebellar: Normal finger-to-nose testing.  Mild dysmetria with heel-to-shin testing using the LLE Gait: not tested due to safety concerns    Laboratory Studies:   Basic Metabolic Panel:  Recent Labs Lab 04/21/15 2119  NA 141  K 3.8  CL 104  CO2 32  GLUCOSE 93  BUN 13  CREATININE 1.07*  CALCIUM 9.1    Liver Function Tests: No results for input(s): AST, ALT, ALKPHOS, BILITOT, PROT, ALBUMIN in the last 168 hours. No results for input(s): LIPASE, AMYLASE in the last 168 hours. No results for input(s): AMMONIA in the last 168 hours.  CBC:  Recent Labs Lab 04/21/15 2119  WBC 8.0  HGB 12.0  HCT 37.8  MCV 87.5  PLT 248    Cardiac Enzymes: No results for input(s): CKTOTAL, CKMB, CKMBINDEX, TROPONINI in the last 168 hours.  BNP: Invalid input(s): POCBNP  CBG: No results for input(s): GLUCAP in the last 168 hours.  Microbiology: Results for orders placed or performed in visit on 11/14/13  Ova and parasite screen     Status: None   Collection Time: 11/14/13  8:31 AM  Result Value Ref Range Status   OP No Ova or Parasites Seen   Final  Stool culture     Status: None   Collection Time: 11/14/13  8:31 AM  Result Value Ref Range Status   Organism ID, Bacteria No Salmonella,Shigella,Campylobacter,Yersinia,or  Final   Organism ID, Bacteria No E.coli 0157:H7 isolated.  Final  Giardia antigen     Status: None   Collection Time: 11/14/13  8:31 AM  Result Value Ref Range Status   Giardia Screen (EIA) NEGATIVE  Final    Coagulation  Studies:  Recent Labs  04/21/15 2119  LABPROT 13.3  INR 0.99    Urinalysis: No results for input(s): COLORURINE, LABSPEC, PHURINE, GLUCOSEU, HGBUR, BILIRUBINUR, KETONESUR, PROTEINUR, UROBILINOGEN, NITRITE, LEUKOCYTESUR in the last 168 hours.  Invalid input(s): APPERANCEUR  Lipid Panel:     Component Value Date/Time   CHOL 152 01/12/2015 1034   TRIG 70 01/12/2015 1034   HDL 53 01/12/2015 1034   CHOLHDL 2.9 01/12/2015 1034   VLDL 14 01/12/2015 1034   LDLCALC 85 01/12/2015 1034    HgbA1C:  Lab Results  Component Value Date   HGBA1C 5.9 01/12/2015    Urine Drug Screen:  No results found for: LABOPIA, COCAINSCRNUR, LABBENZ, AMPHETMU, THCU, LABBARB  Alcohol Level: No results for input(s): ETH in the last 168 hours.  Other results: EKG: normal sinus rhythm at 66 bpm  Imaging: Ct Head Wo Contrast  04/21/2015   CLINICAL DATA:  Gait difficulty and right hand weakness for 1 day. Mild headache. Initial encounter.  EXAM: CT HEAD WITHOUT CONTRAST  TECHNIQUE: Contiguous axial images were obtained from the base of the skull through the vertex without intravenous contrast.  COMPARISON:  Head CT 11/10/2013.  FINDINGS: There is a 2.8 x 1.4 cm acute hematoma within the left putamen and external capsule (image 15). This has an estimated volume of approximately 3.9 ml. There is mild surrounding vasogenic edema. No other evidence of acute intracranial hemorrhage, extra-axial fluid collection, midline shift or hydrocephalus. Mild underlying small vessel ischemic changes are present within the periventricular white matter bilaterally.  The visualized paranasal sinuses, mastoid air cells and middle ears are clear. The calvarium is intact.  IMPRESSION: 1. Acute hemorrhage within the left putamen and external capsule. This may reflect a hypertensive bleed or hemorrhagic stroke. 2. No midline shift or hydrocephalus. 3. Critical Value/emergent results were called by telephone at the time of interpretation on  04/21/2015 at 9:37 pm to Dr. Evelina Bucy , who verbally acknowledged these results.   Electronically Signed   By: Richardean Sale M.D.   On: 04/21/2015 21:39    Assessment: 74 y.o. female presenting with difficulty with gait and dropping items from her right hand.  Neurological examination with only minimal findings.  Head CT personally reviewed and shows an acute putamenal and external capsular hemorrhage.  Patient with a history of hypertension but compliant with medications.  Further work up recommended.    Stroke Risk Factors - hyperlipidemia and hypertension  Plan: 1. HgbA1c, fasting lipid panel 2. MRI, MRA  of  the brain without contrast 3. PT consult, OT consult, Speech consult 4. Echocardiogram 5. Carotid dopplers 6. Prophylactic therapy-None. D/C ASA 7. NPO until RN stroke swallow screen 8. Telemetry monitoring 9. Frequent neuro checks 10. BP control with target SBP < 160  This patient is critically ill and at significant risk of neurological worsening, death and care requires constant monitoring of vital signs, hemodynamics,respiratory and cardiac monitoring, neurological assessment, discussion with family, other specialists and medical decision making of high complexity. I spent 40 minutes of neurocritical care time  in the care of  this patient.  Alexis Goodell, MD [facility] [phone] 04/21/2015  11:58 PM

## 2015-04-22 ENCOUNTER — Inpatient Hospital Stay (HOSPITAL_COMMUNITY): Payer: Medicare Other

## 2015-04-22 DIAGNOSIS — I10 Essential (primary) hypertension: Secondary | ICD-10-CM

## 2015-04-22 DIAGNOSIS — I6789 Other cerebrovascular disease: Secondary | ICD-10-CM

## 2015-04-22 DIAGNOSIS — I639 Cerebral infarction, unspecified: Secondary | ICD-10-CM

## 2015-04-22 DIAGNOSIS — G936 Cerebral edema: Secondary | ICD-10-CM | POA: Insufficient documentation

## 2015-04-22 DIAGNOSIS — I619 Nontraumatic intracerebral hemorrhage, unspecified: Secondary | ICD-10-CM | POA: Diagnosis present

## 2015-04-22 DIAGNOSIS — I610 Nontraumatic intracerebral hemorrhage in hemisphere, subcortical: Principal | ICD-10-CM

## 2015-04-22 LAB — MRSA PCR SCREENING: MRSA by PCR: NEGATIVE

## 2015-04-22 MED ORDER — IRBESARTAN 75 MG PO TABS
37.5000 mg | ORAL_TABLET | Freq: Every day | ORAL | Status: DC
  Administered 2015-04-22: 37.5 mg via ORAL
  Filled 2015-04-22 (×2): qty 0.5

## 2015-04-22 MED ORDER — SENNOSIDES-DOCUSATE SODIUM 8.6-50 MG PO TABS
1.0000 | ORAL_TABLET | Freq: Two times a day (BID) | ORAL | Status: DC
  Administered 2015-04-22 (×2): 1 via ORAL
  Filled 2015-04-22 (×4): qty 1

## 2015-04-22 MED ORDER — STROKE: EARLY STAGES OF RECOVERY BOOK
Freq: Once | Status: AC
  Administered 2015-04-22: 02:00:00
  Filled 2015-04-22: qty 1

## 2015-04-22 MED ORDER — PANTOPRAZOLE SODIUM 40 MG IV SOLR
40.0000 mg | Freq: Every day | INTRAVENOUS | Status: DC
  Administered 2015-04-22: 40 mg via INTRAVENOUS
  Filled 2015-04-22 (×2): qty 40

## 2015-04-22 MED ORDER — ACETAMINOPHEN 325 MG PO TABS: 650.0000 mg | ORAL_TABLET | ORAL | Status: DC | PRN

## 2015-04-22 MED ORDER — SIMVASTATIN 40 MG PO TABS
40.0000 mg | ORAL_TABLET | Freq: Every day | ORAL | Status: DC
  Administered 2015-04-22: 40 mg via ORAL
  Filled 2015-04-22 (×2): qty 1

## 2015-04-22 MED ORDER — LABETALOL HCL 5 MG/ML IV SOLN
10.0000 mg | INTRAVENOUS | Status: DC | PRN
  Administered 2015-04-22: 10 mg via INTRAVENOUS
  Filled 2015-04-22 (×2): qty 4

## 2015-04-22 MED ORDER — PANTOPRAZOLE SODIUM 40 MG PO TBEC
40.0000 mg | DELAYED_RELEASE_TABLET | Freq: Every day | ORAL | Status: DC
  Administered 2015-04-22: 40 mg via ORAL

## 2015-04-22 MED ORDER — ACETAMINOPHEN 650 MG RE SUPP: 650.0000 mg | RECTAL | Status: DC | PRN

## 2015-04-22 NOTE — Progress Notes (Signed)
OT Cancellation Note  Patient Details Name: Tracey Richard MRN: [id number] DOB: Mar 08, 1941   Cancelled Treatment:    Reason Eval/Treat Not Completed: Patient not medically ready Pt on bedrest. Will assess when activity orders updated. Thanks. [place], OTR/L  [phone] 04/22/2015 04/22/2015, 8:22 AM

## 2015-04-22 NOTE — Progress Notes (Signed)
PT Cancellation Note  Patient Details Name: Tracey Richard MRN: [id number] DOB: 1940-12-01   Cancelled Treatment:    Reason Eval/Treat Not Completed: Patient not medically ready.  Pt currently on bedrest.  Please advance activity order when appropriate for PT and mobility.     [place] Marchiano, Thornton Papas 04/22/2015, 7:55 AM

## 2015-04-22 NOTE — Progress Notes (Signed)
Bilateral carotid artery duplex completed:  1-39% ICA stenosis.  Vertebral artery flow is antegrade.     

## 2015-04-22 NOTE — Care Management Note (Signed)
Case Management Note  Patient Details  Name: Tracey Richard MRN: [id number] Date of Birth: Sep 17, 1941  Subjective/Objective:   Pt admitted on 04/21/15 with Lt putamenal and external capsule infarcts.  PTA, pt independent of ADLS.  Has supportive boyfriend.                   Action/Plan: Will follow for dc planning as pt progresses.  PT recommending no OP follow up.    Expected Discharge Date:                  Expected Discharge Plan:  [place]  In-House Referral:     Discharge planning Services  CM Consult  Post Acute Care Choice:    Choice offered to:     DME Arranged:    DME Agency:     HH Arranged:    [place] Agency:     Status of Service:  In process, will continue to follow  Medicare Important Message Given:    Date Medicare IM Given:    Medicare IM give by:    Date Additional Medicare IM Given:    Additional Medicare Important Message give by:     If discussed at [place] of Stay Meetings, dates discussed:    Additional Comments:  Reinaldo Raddle, RN, BSN  Trauma/Neuro ICU Case Manager [phone]

## 2015-04-22 NOTE — Evaluation (Signed)
Physical Therapy Evaluation Patient Details Name: ISRAELLA HUBERT MRN: [id number] DOB: 1941/09/12 Today's Date: 04/22/2015   History of Present Illness  pt rpesents with L Putamenal and External Capsule Infarct.  Clinical Impression  Pt moving well and appears to be at baseline level of mobility.  Pt ed on signs/symptoms of CVA.  No further PT needs at this time, will sign off.      Follow Up Recommendations No PT follow up    Equipment Recommendations  None recommended by PT    Recommendations for Other Services       Precautions / Restrictions Precautions Precautions: None Restrictions Weight Bearing Restrictions: No      Mobility  Bed Mobility Overal bed mobility: Independent                Transfers Overall transfer level: Independent Equipment used: None                Ambulation/Gait Ambulation/Gait assistance: Independent Ambulation Distance (Feet): 150 Feet Assistive device: None Gait Pattern/deviations: WFL(Within Functional Limits)     General Gait Details: pt able to ambulate without difficulty, changing speeds, performing head turns, and stepping over obstacles.    Stairs            Wheelchair Mobility    Modified Rankin (Stroke Patients Only) Modified Rankin (Stroke Patients Only) Pre-Morbid Rankin Score: No symptoms Modified Rankin: No symptoms     Balance Overall balance assessment: Independent                                           Pertinent Vitals/Pain Pain Assessment: No/denies pain    Home Living Family/patient expects to be discharged to:: Private residence Living Arrangements: Alone Available Help at Discharge: Friend(s);Available PRN/intermittently Lavada Mesi Friend") Type of Home: Apartment Home Access: Elevator     Home Layout: One level Home Equipment: None      Prior Function Level of Independence: Independent               Hand Dominance        Extremity/Trunk  Assessment   Upper Extremity Assessment: Overall WFL for tasks assessed           Lower Extremity Assessment: Overall WFL for tasks assessed      Cervical / Trunk Assessment: Normal  Communication   Communication: No difficulties  Cognition Arousal/Alertness: Awake/alert Behavior During Therapy: WFL for tasks assessed/performed Overall Cognitive Status: Within Functional Limits for tasks assessed                      General Comments      Exercises        Assessment/Plan    PT Assessment Patent does not need any further PT services  PT Diagnosis Difficulty walking   PT Problem List    PT Treatment Interventions     PT Goals (Current goals can be found in the Care Plan section) Acute Rehab PT Goals Patient Stated Goal: Home today! PT Goal Formulation: All assessment and education complete, DC therapy    Frequency     Barriers to discharge        Co-evaluation               End of Session   Activity Tolerance: Patient tolerated treatment well Patient left: in chair;with call bell/phone within reach;with family/visitor present Nurse Communication: Mobility  status         Time: 0952-1021 PT Time Calculation (min) (ACUTE ONLY): 29 min   Charges:   PT Evaluation $Initial PT Evaluation Tier I: 1 Procedure PT Treatments $Gait Training: 8-22 mins   PT G CodesCatarina Hartshorn, [place] 04/22/2015, 12:02 PM

## 2015-04-22 NOTE — Progress Notes (Signed)
  Echocardiogram 2D Echocardiogram has been performed.  Darlina Sicilian M 04/22/2015, 12:33 PM

## 2015-04-22 NOTE — Progress Notes (Signed)
Chaplain Note:   Chaplain responded to consult with [name] this morning.   She expressed that she is feeling very good this morning. She presented as very warm, conversational and expressive.  We shared a brief conversation as she went through what brought her here and what the physicians have noted.   She is in high spirits and noted a vibrant spirituality that has her feeling hopeful and strong.   Will follow up.   Delford Field, Chaplain 04/22/2015

## 2015-04-22 NOTE — Progress Notes (Signed)
eLink Physician-Brief Progress Note Patient Name: Tracey Richard DOB: 02-Jul-1941 MRN: [id number]   Date of Service  04/22/2015  HPI/Events of Note  74 yo female with PMH of HTN and Hyperlipidemia. Admitted to ICU for acute putamenal and external capsule hemorrhage. Management per Neurology. On Labetalol IV PRN for HTN. BP = 151/70 and HR = 74. Sat = 100% and RR = 21.   eICU Interventions  Continue present management.      Intervention Category Evaluation Type: New Patient Evaluation  [name] 04/22/2015, 12:14 AM

## 2015-04-22 NOTE — Progress Notes (Signed)
STROKE TEAM PROGRESS NOTE   HISTORY Tracey Richard is a 74 y.o. female who reports that yesterday she awakened normal. Went for a walk with her friend and noted that she was Walking as if drunk. She was going to the right. She was taken back home and went to bed early which is unusual for her. Awakened this afternoon and her gait was much better but was noted to drop the mail from her right hand. Also noted when she went out to dinner that she was dropping her fork. Presented for evaluation at that time. Patient is right handed.   Date last known well: Date: 04/19/2015 Time last known well: Time: 16:30 tPA Given: No: ICH   SUBJECTIVE (INTERVAL HISTORY) Her RN is at the bedside.  Overall she feels her condition is gradually improving. Her blood pressure has been adequately controlled and she has remained neurologically stable overnight. MRI scan shows stable appearance of the left basal ganglia hematoma   OBJECTIVE Temp:  [97.7 F (36.5 C)-98 F (36.7 C)] 98 F (36.7 C) (06/30 0357) Pulse Rate:  [59-97] 59 (06/30 0800) Cardiac Rhythm:  [-] Normal sinus rhythm (06/30 0400) Resp:  [15-25] 22 (06/30 0800) BP: (130-194)/(63-148) 130/66 mmHg (06/30 0800) SpO2:  [94 %-100 %] 100 % (06/30 0800) Weight:  [74.844 kg (165 lb)] 74.844 kg (165 lb) (06/29 2009)  No results for input(s): GLUCAP in the last 168 hours.  Recent Labs Lab 04/21/15 2119  NA 141  K 3.8  CL 104  CO2 32  GLUCOSE 93  BUN 13  CREATININE 1.07*  CALCIUM 9.1   No results for input(s): AST, ALT, ALKPHOS, BILITOT, PROT, ALBUMIN in the last 168 hours.  Recent Labs Lab 04/21/15 2119  WBC 8.0  HGB 12.0  HCT 37.8  MCV 87.5  PLT 248   No results for input(s): CKTOTAL, CKMB, CKMBINDEX, TROPONINI in the last 168 hours.  Recent Labs  04/21/15 2119  LABPROT 13.3  INR 0.99   No results for input(s): COLORURINE, LABSPEC, PHURINE, GLUCOSEU, HGBUR, BILIRUBINUR, KETONESUR, PROTEINUR, UROBILINOGEN, NITRITE,  LEUKOCYTESUR in the last 72 hours.  Invalid input(s): APPERANCEUR     Component Value Date/Time   CHOL 152 01/12/2015 1034   TRIG 70 01/12/2015 1034   HDL 53 01/12/2015 1034   CHOLHDL 2.9 01/12/2015 1034   VLDL 14 01/12/2015 1034   LDLCALC 85 01/12/2015 1034   Lab Results  Component Value Date   HGBA1C 5.9 01/12/2015   No results found for: LABOPIA, COCAINSCRNUR, LABBENZ, AMPHETMU, THCU, LABBARB  No results for input(s): ETH in the last 168 hours.   Imaging    Ct Head Wo Contrast 04/21/2015    1. Acute hemorrhage within the left putamen and external capsule. This may reflect a hypertensive bleed or hemorrhagic stroke.  2. No midline shift or hydrocephalus.    Mr Virgel Paling Wo Contrast 04/22/2015     MRI HEAD:  Acute LEFT basal ganglia intraparenchymal hematoma with local mass effect, no midline shift.  Involutional changes. Moderate white matter changes compatible chronic small vessel ischemic disease.    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   Dolicoectatic intracranial vessels can be seen with chronic hypertension.  No large vessel occlusion or high-grade stenosis.        PHYSICAL EXAM Pleasant elderly Caucasian lady currently not in distress. . Afebrile. Head is nontraumatic. Neck is supple without bruit.    Cardiac exam no murmur or gallop. Lungs are clear  to auscultation. Distal pulses are well felt.  Neurological Exam :  Awake alert oriented 3 with normal speech and language function. No aphasia or apraxia dysarthria. Extraocular movements are full range without nystagmus. Fundi were not visualized. Pupils are 4 mm equal reactive. Vision acuity seems adequate. Face is symmetric without weakness. Tongue is midline. Motor system exam revealed no upper or lower extremity drift. Mild weakness of right grip and intrinsic hand muscles. Orbits left-to-right approximately. Symmetric lower extremity strength. Deep tendon reflexes are  symmetric. Plantars are downgoing. Touch pinprick sensation are preserved bilaterally. Gait was not tested.   ASSESSMENT/PLAN [name] is a 74 y.o. female with history of hyperlipidemia, hypertension, presenting with gait difficulty and right hand weakness. She did not receive IV t-PA due to hemorrhagic stroke.  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  No significant stenosis of major vessels.  Carotid Doppler pending  2D Echo pending  LDL 01/12/2015 - 85  HgbA1c 01/12/2015 - 5.9  SCDs for VTE prophylaxis  Diet Heart Room service appropriate?: Yes; Fluid consistency:: Thin  aspirin 81 mg orally every day prior to admission, now on no antithrombotic secondary to acute LEFT basal ganglia intraparenchymal hematoma  Ongoing aggressive stroke risk factor management  Therapy recommendations: Pending  Disposition: Pending  Hypertension  Home meds: Diovan 40 mg daily  Stable  Patient counseled to be compliant with her blood pressure medications  Hyperlipidemia  Home meds:  Zocor 40 mg daily not resumed in hospital  LDL 85 01/10/2015, goal < 70  Check fasting lipids  Continue statin at discharge    Other Stroke Risk Factors  Advanced age    Other Active Problems  Mildly elevated creatinine  Other Pertinent History  Cancer history  Hospital day # 1  I have personally examined this patient, reviewed notes, independently viewed imaging studies, participated in medical decision making and plan of care. I have made any additions or clarifications directly to the above note. She presented with intermittent right hand weakness for last 3 days likely to left basal ganglia hemorrhage of hypertensive etiology. She remains at risk for neurological worsening, hematoma expansion and needs close neurological  monitoring and aggressive blood pressure control. Plan start oral medications for blood pressure control and therapy consults. This patient is critically ill and at significant risk of neurological worsening, death and care requires constant monitoring of vital signs, hemodynamics,respiratory and cardiac monitoring, extensive review of multiple databases, frequent neurological assessment, discussion with family, other specialists and medical decision making of high complexity.I have made any additions or clarifications directly to the above note.This critical care time does not reflect procedure time, or teaching time or supervisory time of PA/NP/Med Resident etc but could involve care discussion time.  I spent 30 minutes of neurocritical care time  in the care of  this patient.  Antony Contras, MD Medical Director [facility] Stroke Center Pager: [phone] 04/22/2015 2:37 PM     To contact Stroke Continuity provider, please refer to [email or web address] After hours, contact General Neurology

## 2015-04-23 ENCOUNTER — Telehealth: Payer: Self-pay

## 2015-04-23 DIAGNOSIS — I612 Nontraumatic intracerebral hemorrhage in hemisphere, unspecified: Secondary | ICD-10-CM

## 2015-04-23 LAB — LIPID PANEL
CHOL/HDL RATIO: 3.5 ratio
CHOLESTEROL: 196 mg/dL (ref 0–200)
HDL: 56 mg/dL (ref >40)
LDL Cholesterol: 117 mg/dL — ABNORMAL HIGH (ref 0–99)
TRIGLYCERIDES: 113 mg/dL (ref <150)
VLDL: 23 mg/dL (ref 0–40)

## 2015-04-23 NOTE — Discharge Summary (Signed)
Stroke Discharge Summary  Patient ID: Tracey Richard   MRN: [id number]      DOB: December 03, 1940  Date of Admission: 04/21/2015 Date of Discharge: 04/23/2015  Attending Physician:  No att. 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  Past Medical History  Diagnosis Date  . Cancer   . GERD (gastroesophageal reflux disease)   . Hyperlipidemia   . Hypertension   . Allergy   . Cataract    Past Surgical History  Procedure Laterality Date  . Tonsillectomy  1959  . Back surgery  2007  . Appendectomy  1963  . [place]  . Breast surgery    . Spine surgery    . Eye surgery  1999    bilateral cataract extraction  . Dilation and curettage of uterus        Medication List    STOP taking these medications        artificial tears Oint ophthalmic ointment     aspirin 81 MG tablet     azithromycin 500 MG tablet  Commonly known as:  ZITHROMAX     benzonatate 100 MG capsule  Commonly known as:  TESSALON     fluticasone 50 MCG/ACT nasal spray  Commonly known as:  FLONASE     HYDROcodone-homatropine 5-1.5 MG/5ML syrup  Commonly known as:  HYCODAN     ibuprofen 200 MG tablet  Commonly known as:  ADVIL,MOTRIN     omeprazole 40 MG capsule  Commonly known as:  PRILOSEC     traMADol 50 MG tablet  Commonly known as:  ULTRAM      TAKE these medications        imipramine 25 MG tablet  Commonly known as:  TOFRANIL  TAKE 3 TABLETS (75 MG TOTAL) BY MOUTH AT BEDTIME.     simvastatin 40 MG tablet  Commonly known as:  ZOCOR  TAKE 1 TABLET (40 MG TOTAL) BY MOUTH DAILY AT BEDTIME.     valsartan 40 MG tablet  Commonly known as:  DIOVAN  TAKE 1 TABLET (40 MG TOTAL) BY  MOUTH DAILY.        LABORATORY STUDIES CBC    Component Value Date/Time   WBC 8.0 04/21/2015 2119   WBC 6.9 09/08/2014 1303   RBC 4.32 04/21/2015 2119   RBC 4.41 09/08/2014 1303   HGB 12.0 04/21/2015 2119   HGB 12.4 09/08/2014 1303   HCT 37.8 04/21/2015 2119   HCT 39.6 09/08/2014 1303   PLT 248 04/21/2015 2119   MCV 87.5 04/21/2015 2119   MCV 89.6 09/08/2014 1303   MCH 27.8 04/21/2015 2119   MCH 28.1 09/08/2014 1303   MCHC 31.7 04/21/2015 2119   MCHC 31.3* 09/08/2014 1303   RDW 13.5 04/21/2015 2119   LYMPHSABS 1.6 01/12/2015 1034   MONOABS 0.6 01/12/2015 1034   EOSABS 0.2 01/12/2015 1034   BASOSABS 0.0 01/12/2015 1034   CMP    Component Value Date/Time   NA 141 04/21/2015 2119   K 3.8 04/21/2015 2119   CL 104 04/21/2015 2119   CO2 32 04/21/2015 2119   GLUCOSE 93 04/21/2015 2119   BUN 13 04/21/2015 2119  CREATININE 1.07* 04/21/2015 2119   CREATININE 0.96 01/12/2015 1034   CALCIUM 9.1 04/21/2015 2119   PROT 6.4 01/12/2015 1034   ALBUMIN 4.1 01/12/2015 1034   AST 32 01/12/2015 1034   ALT 39* 01/12/2015 1034   ALKPHOS 49 01/12/2015 1034   BILITOT 0.4 01/12/2015 1034   GFRNONAA 51* 04/21/2015 2119   GFRNONAA 60 01/12/2015 1034   GFRAA 59* 04/21/2015 2119   GFRAA 69 01/12/2015 1034   COAGS Lab Results  Component Value Date   INR 0.99 04/21/2015   Lipid Panel    Component Value Date/Time   CHOL 196 04/23/2015 0231   TRIG 113 04/23/2015 0231   HDL 56 04/23/2015 0231   CHOLHDL 3.5 04/23/2015 0231   VLDL 23 04/23/2015 0231   LDLCALC 117* 04/23/2015 0231   HgbA1C  Lab Results  Component Value Date   HGBA1C 5.9 01/12/2015   Cardiac Panel (last 3 results) No results for input(s): CKTOTAL, CKMB, TROPONINI, RELINDX in the last 72 hours. Urinalysis    Component Value Date/Time   BILIRUBINUR neg 09/30/2013 1514   PROTEINUR neg 09/30/2013 1514   UROBILINOGEN 0.2 09/30/2013 1514   NITRITE neg 09/30/2013 1514   LEUKOCYTESUR Negative 09/30/2013 1514    Urine Drug Screen No results found for: LABOPIA, COCAINSCRNUR, LABBENZ, AMPHETMU, THCU, LABBARB  Alcohol Level No results found for: [facility]   SIGNIFICANT DIAGNOSTIC STUDIES  Ct Head Wo Contrast 04/21/2015  1. Acute hemorrhage within the left putamen and external capsule. This may reflect a hypertensive bleed or hemorrhagic stroke.  2. No midline shift or hydrocephalus.  Mr Virgel Paling Wo Contrast 04/22/2015  MRI HEAD:  Acute LEFT basal ganglia intraparenchymal hematoma with local mass effect, no midline shift. Involutional changes. Moderate white matter changes compatible chronic small vessel ischemic disease.  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  Dolicoectatic intracranial vessels can be seen with chronic hypertension.  No large vessel occlusion or high-grade stenosis.     Carotid Doppler 1-39% ICA stenosis. Vertebral artery flow is antegrade.   2D Echo : Left ventricle: The cavity size was normal. Wall thickness was normal. Systolic function was normal. The estimated ejection fraction was in the range of 55% to 60%. Wall motion was normal; there were no regional wall motion abnormalities. Left ventricular diastolic function parameters were normal.  LDL 01/12/2015 - 85 HgbA1c 01/12/2015 - 5.9 HISTORY OF PRESENT ILLNESS IYSIS Tracey Richard is a 74 y.o. female who reports that yesterday she awakened normal. Went for a walk with her friend and noted that she was Walking as if drunk. She was going to the right. She was taken back home and went to bed early which is unusual for her. Awakened this afternoon and her gait was much better but was noted to drop the mail from her right hand. Also noted when she went out to dinner that she was dropping her fork. Presented for evaluation at that time. Patient is right handed.  Date last known well: Date: 04/19/2015 Time last known well: Time: 16:30 tPA Given: No: [place]  She was admitted to the intensive care unit where she underwent close neurological monitoring as well as tight blood pressure control. Her blood pressure was not particularly elevated and was he controlled. She was cleared by speech therapy and started on no medications. MRI scan was obtained later on the day of admission and confirmed left basal ganglia hemorrhage without any underlying lesion. There is no significant mass  effect, midline shift or intraventricular extension. Patient underwent echocardiogram, carotid Doppler both of which were normal. She had only minimal weakness of the right hand muscles which was improving at the time of discharge. She was seen by physical occupational speech therapy and found to have no therapy needs and was felt stable to be discharged home with instructions to follow-up with her primary care physician in 2 weeks and with Dr. Leonie Man in 2 months. DISCHARGE EXAM Blood pressure 136/73, pulse 78, temperature 98.2 F (36.8 C), temperature source Oral, resp. rate 12, height 5' 3.5" (1.613 m), weight 165 lb (74.844 kg), SpO2 99 %.  PHYSICAL EXAM Pleasant elderly Caucasian lady currently not in distress. . Afebrile. Head is nontraumatic. Neck is supple without bruit. Cardiac exam no murmur or gallop. Lungs are clear to auscultation. Distal pulses are well felt.  Neurological Exam : Awake alert oriented 3 with normal speech and language function. No aphasia or apraxia dysarthria. Extraocular movements are full range without nystagmus. Fundi were not visualized. Pupils are 4 mm equal reactive. Vision acuity seems adequate. Face is symmetric without weakness. Tongue is midline. Motor system exam revealed no upper or lower extremity drift. Mild weakness of right grip and intrinsic hand muscles. Orbits left-to-right approximately. Symmetric lower extremity strength. Deep tendon reflexes are symmetric. Plantars are downgoing. Touch pinprick sensation are preserved  bilaterally. Gait was not tested.  Discharge Diet   Diet Heart Room service appropriate?: Yes; Fluid consistency:: Thin Diet - low sodium heart healthy liquids  DISCHARGE PLAN  Disposition:  Home   No antiplatelets/ for secondary stroke prevention due to [place].  Follow-up DAUB, STEVE A, MD in 2 weeks.  Follow-up with Dr. Antony Contras, Stroke Clinic in 2 months.  35 minutes were spent preparing discharge.  Antony Contras, MD

## 2015-04-23 NOTE — Telephone Encounter (Signed)
Pt states that dr Leda Gauze has tried to call pt twice while she is in the hospital and she would like for dr daub to know that she is going

## 2015-04-23 NOTE — Progress Notes (Signed)
Pt will dc home with boyfriend. VSS dc instructions given. No prescriptions given. Dc via wheelchair with volunteer services

## 2015-04-23 NOTE — Progress Notes (Signed)
STROKE TEAM PROGRESS NOTE   HISTORY Tracey Richard is a 74 y.o. female who reports that yesterday she awakened normal. Went for a walk with her friend and noted that she was Walking as if drunk. She was going to the right. She was taken back home and went to bed early which is unusual for her. Awakened this afternoon and her gait was much better but was noted to drop the mail from her right hand. Also noted when she went out to dinner that she was dropping her fork. Presented for evaluation at that time. Patient is right handed.   Date last known well: Date: 04/19/2015 Time last known well: Time: 16:30 tPA Given: No: ICH   SUBJECTIVE (INTERVAL HISTORY) Her RN is at the bedside.  Overall she feels her condition is gradually improving. Her blood pressure has been adequately controlled and she has remained neurologically stable overnight.  Cardiac echo and carotid Doppler is unremarkable. Patient has been cleared by physical occupational therapy and wants to go home  OBJECTIVE Temp:  [97.6 F (36.4 C)-98.5 F (36.9 C)] 98.2 F (36.8 C) (07/01 0800) Pulse Rate:  [65-96] 78 (07/01 0900) Cardiac Rhythm:  [-] Normal sinus rhythm (07/01 0800) Resp:  [12-27] 12 (07/01 0900) BP: (109-169)/(52-118) 136/73 mmHg (07/01 0900) SpO2:  [93 %-100 %] 99 % (07/01 0900)  No results for input(s): GLUCAP in the last 168 hours.  Recent Labs Lab 04/21/15 2119  NA 141  K 3.8  CL 104  CO2 32  GLUCOSE 93  BUN 13  CREATININE 1.07*  CALCIUM 9.1   No results for input(s): AST, ALT, ALKPHOS, BILITOT, PROT, ALBUMIN in the last 168 hours.  Recent Labs Lab 04/21/15 2119  WBC 8.0  HGB 12.0  HCT 37.8  MCV 87.5  PLT 248   No results for input(s): CKTOTAL, CKMB, CKMBINDEX, TROPONINI in the last 168 hours.  Recent Labs  04/21/15 2119  LABPROT 13.3  INR 0.99   No results for input(s): COLORURINE, LABSPEC, PHURINE, GLUCOSEU, HGBUR, BILIRUBINUR, KETONESUR, PROTEINUR, UROBILINOGEN, NITRITE,  LEUKOCYTESUR in the last 72 hours.  Invalid input(s): APPERANCEUR     Component Value Date/Time   CHOL 196 04/23/2015 0231   TRIG 113 04/23/2015 0231   HDL 56 04/23/2015 0231   CHOLHDL 3.5 04/23/2015 0231   VLDL 23 04/23/2015 0231   LDLCALC 117* 04/23/2015 0231   Lab Results  Component Value Date   HGBA1C 5.9 01/12/2015   No results found for: LABOPIA, COCAINSCRNUR, LABBENZ, AMPHETMU, THCU, LABBARB  No results for input(s): ETH in the last 168 hours.   Imaging    Ct Head Wo Contrast 04/21/2015    1. Acute hemorrhage within the left putamen and external capsule. This may reflect a hypertensive bleed or hemorrhagic stroke.  2. No midline shift or hydrocephalus.    Mr Tracey Richard Wo Contrast 04/22/2015     MRI HEAD:  Acute LEFT basal ganglia intraparenchymal hematoma with local mass effect, no midline shift.  Involutional changes. Moderate white matter changes compatible chronic small vessel ischemic disease.    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   Dolicoectatic intracranial vessels can be seen with chronic hypertension.  No large vessel occlusion or high-grade stenosis.        PHYSICAL EXAM Pleasant elderly Caucasian lady currently not in distress. . Afebrile. Head is nontraumatic. Neck is supple without bruit.    Cardiac exam no murmur or gallop. Lungs are clear to auscultation. Distal  pulses are well felt.  Neurological Exam :  Awake alert oriented 3 with normal speech and language function. No aphasia or apraxia dysarthria. Extraocular movements are full range without nystagmus. Fundi were not visualized. Pupils are 4 mm equal reactive. Vision acuity seems adequate. Face is symmetric without weakness. Tongue is midline. Motor system exam revealed no upper or lower extremity drift. Mild weakness of right grip and intrinsic hand muscles. Orbits left-to-right approximately. Symmetric lower extremity strength. Deep tendon reflexes are  symmetric. Plantars are downgoing. Touch pinprick sensation are preserved bilaterally. Gait was not tested.   ASSESSMENT/PLAN [name] is a 74 y.o. female with history of hyperlipidemia, hypertension, presenting with gait difficulty and right hand weakness. She did not receive IV t-PA due to hemorrhagic stroke.  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  No significant stenosis of major vessels.  Carotid Doppler 1-39% ICA stenosis. Vertebral artery flow is antegrade.   2D Echo Left ventricle: The cavity size was normal. Wall thickness was normal. Systolic function was normal. The estimated ejection fraction was in the range of 55% to 60%. Wall motion was normal; there were no regional wall motion abnormalities. Left ventricular diastolic function parameters were normal.  LDL 01/12/2015 - 85  HgbA1c 01/12/2015 - 5.9  SCDs for VTE prophylaxis Diet Heart Room service appropriate?: Yes; Fluid consistency:: Thin Diet - low sodium heart healthy  aspirin 81 mg orally every day prior to admission, now on no antithrombotic secondary to acute LEFT basal ganglia intraparenchymal hematoma  Ongoing aggressive stroke risk factor management  Therapy recommendations: none  Disposition: home  Hypertension  Home meds: Diovan 40 mg daily  Stable  Patient counseled to be compliant with her blood pressure medications  Hyperlipidemia  Home meds:  Zocor 40 mg daily not resumed in hospital  LDL 85 01/10/2015, goal < 70  Continue statin at discharge    Other Stroke Risk Factors  Advanced age    Other Active Problems  Mildly elevated creatinine  Other Pertinent History  Cancer history  Hospital day # 2  I have personally examined this patient, reviewed notes, independently viewed imaging studies,  participated in medical decision making and plan of care. I have made any additions or clarifications directly to the above note. She presented with intermittent right hand weakness for last 3 days likely to left basal ganglia hemorrhage of hypertensive etiology. She remains at risk for neurological worsening, hematoma expansion and needs close neurological monitoring and aggressive blood pressure control. Plan start oral medications for blood pressure control and therapy consults.  Plan discharge home today on prior home medications and to discontinue aspirin. Follow up with primary physician Dr. Ivar Bury in 2 weeks and with me in 2 months.  Antony Contras, MD Medical Director [facility] Stroke Center Pager: [phone] 04/23/2015 1:46 PM     To contact Stroke Continuity provider, please refer to [email or web address] After hours, contact General Neurology

## 2015-04-29 ENCOUNTER — Telehealth: Payer: Self-pay | Admitting: Neurology

## 2015-04-29 NOTE — Telephone Encounter (Signed)
Spoke to patient, she is currently not in pain but would like to know what. Advised per Dr. Leonie Man ok to take Tylenol w/ codeine. Patient says she still has Ibuprofen. Advised patient ok to take. Patient verbalized understanding.

## 2015-04-29 NOTE — Telephone Encounter (Signed)
Pt wants to know what she can take for pain since she was told not to take Asprin?

## 2015-05-07 ENCOUNTER — Ambulatory Visit (INDEPENDENT_AMBULATORY_CARE_PROVIDER_SITE_OTHER): Payer: Medicare Other | Admitting: Emergency Medicine

## 2015-05-07 ENCOUNTER — Telehealth: Payer: Self-pay

## 2015-05-07 VITALS — BP 137/76 | HR 83 | Temp 97.7°F | Resp 20 | Ht 63.5 in | Wt 169.2 lb

## 2015-05-07 DIAGNOSIS — I639 Cerebral infarction, unspecified: Secondary | ICD-10-CM

## 2015-05-07 DIAGNOSIS — I10 Essential (primary) hypertension: Secondary | ICD-10-CM

## 2015-05-07 DIAGNOSIS — G4710 Hypersomnia, unspecified: Secondary | ICD-10-CM | POA: Diagnosis not present

## 2015-05-07 DIAGNOSIS — E119 Type 2 diabetes mellitus without complications: Secondary | ICD-10-CM | POA: Diagnosis not present

## 2015-05-07 NOTE — Telephone Encounter (Signed)
Patient came in and asked Dr. Everlene Farrier to write her a note stating that she had a stroke and is unable to fly. She states that this is for the insurance that she uses when she buys her plane tickets. The insurance will refund her $377 as long as she has a note. Dr. Everlene Farrier says he'll get his assistant to work on it. Please call when ready for pick up!  Patient phone: [phone]

## 2015-05-07 NOTE — Progress Notes (Addendum)
This chart was scribed for [name], MD by Marti Sleigh, Medical Scribe. This patient was seen in Room 2 and the patient's care was started at 12:03 PM.  Chief Complaint:  Chief Complaint  Patient presents with  . Follow-up    post hospital  . Follow-up    for tic bite    HPI: Tracey Richard is a 74 y.o. female with a past hx of well controlled HTN and DM who reports to [facility] today for a follow up due to a recent hospital admission for a hemorraghic stroke. Pt states she is not taking a baby aspirin. Pt denies abnormal gait since the stroke. Pt states she feels great, but states she felt great even when she reported to the ED. Pt states she had a very mild HA, on a pain scale 1/10.  Pt states she inially reported to the ED because she dropped her fork twice during dinner, and a couple of days before that her right knee buckled 4-5 times, and around the same time she dropped her keys several times.  Pt had a recent hospital admission for a left basil ganglia hemorrhagic stroke 06/29-07/01.   Past Medical History  Diagnosis Date  . Cancer   . GERD (gastroesophageal reflux disease)   . Hyperlipidemia   . Hypertension   . Allergy   . Cataract   . Stroke    Past Surgical History  Procedure Laterality Date  . Tonsillectomy  1959  . Back surgery  2007  . Appendectomy  1963  . [place]  . Breast surgery    . Spine surgery    . Eye surgery  1999    bilateral cataract extraction  . Dilation and curettage of uterus     History   Social History  . Marital Status: Single    Spouse Name: N/A  . Number of Children: N/A  . Years of Education: N/A   Occupational History  . retired    Social History Main Topics  . Smoking status: Never Smoker   . Smokeless tobacco: Never Used  . Alcohol Use: No  . Drug Use: No  . Sexual Activity: No   Other Topics Concern  . None   Social History Narrative   Walks daily. Divorced. Education: [employer].   Family History    Problem Relation Age of Onset  . Cancer Mother     vaginal cancer, also HTN  . Prostate cancer Father     also HTN, dementia  . Diabetes Brother    Allergies  Allergen Reactions  . Penicillins   . Sulfur    Prior to Admission medications   Medication Sig Start Date End Date Taking? Authorizing Provider  imipramine (TOFRANIL) 25 MG tablet TAKE 3 TABLETS (75 MG TOTAL) BY MOUTH AT BEDTIME. 01/14/15  Yes [name], MD  omeprazole (PRILOSEC) 40 MG capsule Take 40 mg by mouth daily.   Yes Historical Provider, MD  simvastatin (ZOCOR) 40 MG tablet TAKE 1 TABLET (40 MG TOTAL) BY MOUTH DAILY AT BEDTIME. 03/22/15  Yes [name], MD  valsartan (DIOVAN) 40 MG tablet TAKE 1 TABLET (40 MG TOTAL) BY MOUTH DAILY. 01/14/15  Yes [name], MD     ROS: The patient denies fevers, chills, night sweats, unintentional weight loss, chest pain, palpitations, wheezing, dyspnea on exertion, nausea, vomiting, abdominal pain, dysuria, hematuria, melena, numbness, weakness, or tingling.  All other systems have been reviewed and were otherwise negative with the  exception of those mentioned in the HPI and as above.    PHYSICAL EXAM: Filed Vitals:   05/07/15 1153  BP: 137/76  Pulse: 83  Temp: 97.7 F (36.5 C)  Resp: 20   Body mass index is 29.5 kg/(m^2).   General: Alert, no acute distress HEENT:  Normocephalic, atraumatic, oropharynx patent. Eye: [name] [facility] Cardiovascular:  Regular rate and rhythm, no rubs murmurs or gallops.  No Carotid bruits, radial pulse intact. No pedal edema.  Respiratory: Clear to auscultation bilaterally.  No wheezes, rales, or rhonchi.  No cyanosis, no use of accessory musculature Abdominal: No organomegaly, abdomen is soft and non-tender, positive bowel sounds.  No masses. Musculoskeletal: Gait intact. No edema, tenderness Skin: No rashes. 1/1cm nodule on the right posterior shoulder. Neurologic: Facial musculature symmetric. Psychiatric: Patient acts  appropriately throughout our interaction. Lymphatic: No cervical or submandibular lymphadenopathy  BP recheck by Dr. Everlene Farrier. Rt arm: 140/76, Left arm: 147/87  LABS: Results for orders placed or performed during the hospital encounter of 04/21/15  MRSA PCR Screening  Result Value Ref Range   MRSA by PCR NEGATIVE NEGATIVE  CBC  Result Value Ref Range   WBC 8.0 4.0 - 10.5 K/uL   RBC 4.32 3.87 - 5.11 MIL/uL   Hemoglobin 12.0 12.0 - 15.0 g/dL   HCT 37.8 36.0 - 46.0 %   MCV 87.5 78.0 - 100.0 fL   MCH 27.8 26.0 - 34.0 pg   MCHC 31.7 30.0 - 36.0 g/dL   RDW 13.5 11.5 - 15.5 %   Platelets 248 150 - 400 K/uL  Basic metabolic panel  Result Value Ref Range   Sodium 141 135 - 145 mmol/L   Potassium 3.8 3.5 - 5.1 mmol/L   Chloride 104 101 - 111 mmol/L   CO2 32 22 - 32 mmol/L   Glucose, Bld 93 65 - 99 mg/dL   BUN 13 6 - 20 mg/dL   Creatinine, Ser 1.07 (H) 0.44 - 1.00 mg/dL   Calcium 9.1 8.9 - 10.3 mg/dL   GFR calc non Af Amer 51 (L) >60 mL/min   GFR calc Af Amer 59 (L) >60 mL/min   Anion gap 5 5 - 15  Protime-INR  Result Value Ref Range   Prothrombin Time 13.3 11.6 - 15.2 seconds   INR 0.99 0.00 - 1.49  Lipid panel  Result Value Ref Range   Cholesterol 196 0 - 200 mg/dL   Triglycerides 113 <150 mg/dL   HDL 56 >40 mg/dL   Total CHOL/HDL Ratio 3.5 RATIO   VLDL 23 0 - 40 mg/dL   LDL Cholesterol 117 (H) 0 - 99 mg/dL     EKG/XRAY:   Primary read interpreted by Dr. Everlene Farrier at [facility].   ASSESSMENT/PLAN: 1. CVA (cerebral vascular accident) Patient is a stroke and has recovered well. She had some bleeding around the area and therefore is not on anticoagulation.  2. Hypersomnolence  - Ambulatory referral to Sleep Studies  3. Type 2 diabetes mellitus without complication Her diabetes has been stable no change in treatment  4. Essential hypertension Blood pressure is at goal no change in medication   I personally performed the services described in this documentation, which was  scribed in my presence. The recorded information has been reviewed and is accurate.  [name], MD  [facility], [facility]  05/07/2015 6:51 PM I did tell her not to fly for the next month  Gross sideeffects, risk and benefits, and alternatives of  medications d/w patient. Patient is aware that all medications have potential sideeffects and we are unable to predict every sideeffect or drug-drug interaction that may occur.  [name] MD 05/07/2015 12:04 PM    Results I did tell her I did not 1 her to [place] for the next month

## 2015-05-11 ENCOUNTER — Encounter: Payer: Self-pay | Admitting: Neurology

## 2015-05-13 ENCOUNTER — Telehealth: Payer: Self-pay | Admitting: Neurology

## 2015-05-13 NOTE — Telephone Encounter (Signed)
Patient states she was in the hospital in ICU on 6-29 for 24 hours with a stroke and then the patient was discharged. The patient is doing fine and wants to know if it is ok to fly to [place] or should the patient ride in a car. Also the discharge papers from the hospital said for the patient to stop taking omeprazole (PRILOSEC) 40 MG capsule. Please call and discuss.

## 2015-05-14 NOTE — Telephone Encounter (Signed)
Okay to travel but she needs to follow-up with her primary care physician to watch her blood pressure and keep appointment with me when she returns

## 2015-05-14 NOTE — Telephone Encounter (Signed)
LVM for to return call to office, it is ok for her to travel, but she needs to follow up with PCP concerning her Blood Pressure.  Also should keep her appointment with Dr. Leonie Man after she returns.

## 2015-05-17 NOTE — Telephone Encounter (Signed)
Patient returned call and requested to speak with the nurse. Please call and advise.

## 2015-05-18 ENCOUNTER — Telehealth: Payer: Self-pay | Admitting: *Deleted

## 2015-05-18 ENCOUNTER — Ambulatory Visit (INDEPENDENT_AMBULATORY_CARE_PROVIDER_SITE_OTHER): Payer: Medicare Other | Admitting: Emergency Medicine

## 2015-05-18 ENCOUNTER — Encounter: Payer: Self-pay | Admitting: Emergency Medicine

## 2015-05-18 ENCOUNTER — Telehealth: Payer: Self-pay

## 2015-05-18 VITALS — BP 130/86 | HR 84 | Temp 98.1°F | Resp 16 | Ht 63.0 in | Wt 169.4 lb

## 2015-05-18 DIAGNOSIS — I639 Cerebral infarction, unspecified: Secondary | ICD-10-CM

## 2015-05-18 DIAGNOSIS — I10 Essential (primary) hypertension: Secondary | ICD-10-CM | POA: Diagnosis not present

## 2015-05-18 DIAGNOSIS — R739 Hyperglycemia, unspecified: Secondary | ICD-10-CM

## 2015-05-18 LAB — GLUCOSE, POCT (MANUAL RESULT ENTRY): POC Glucose: 117 mg/dl — AB (ref 70–99)

## 2015-05-18 LAB — POCT GLYCOSYLATED HEMOGLOBIN (HGB A1C): HEMOGLOBIN A1C: 6

## 2015-05-18 MED ORDER — VALSARTAN 80 MG PO TABS: 80.0000 mg | ORAL_TABLET | Freq: Every day | ORAL | Status: DC

## 2015-05-18 NOTE — Telephone Encounter (Signed)
Spoke with patient and informed her that it was ok to travel and she should follow up with her PCP concerning her blood pressure and that she should keep her appt with Dr. Leonie Man for when she returns.  Patient verbalized understanding.

## 2015-05-18 NOTE — Progress Notes (Addendum)
Patient ID: Tracey Richard, female   DOB: 11-13-1940, 74 y.o.   MRN: [id number]     This chart was scribed for [name], MD by Zola Button, Medical Scribe. This patient was seen in room 22 and the patient's care was started at 1:43 PM.   Chief Complaint:  Chief Complaint  Patient presents with  . Follow-up  . Diabetes    HPI: Tracey Richard is a 74 y.o. female with a history of hyperglycemia, hypertension, and ICH who reports to [facility] today for a follow-up. Patient has been doing well. She typically goes to bed after midnight and sleeps until after noon the following day. If she goes to bed on a normal schedule, she typically wakes up around 2:00 AM and cannot fall back asleep. She has an appointment for a sleep study next month. Patient also has an appointment with neurology on September 2nd. She had a bone density scan last month, 6/14.   Past Medical History  Diagnosis Date  . Cancer   . GERD (gastroesophageal reflux disease)   . Hyperlipidemia   . Hypertension   . Allergy   . Cataract   . Stroke    Past Surgical History  Procedure Laterality Date  . Tonsillectomy  1959  . Back surgery  2007  . Appendectomy  1963  . [place]  . Breast surgery    . Spine surgery    . Eye surgery  1999    bilateral cataract extraction  . Dilation and curettage of uterus     History   Social History  . Marital Status: Single    Spouse Name: N/A  . Number of Children: N/A  . Years of Education: N/A   Occupational History  . retired    Social History Main Topics  . Smoking status: Never Smoker   . Smokeless tobacco: Never Used  . Alcohol Use: No  . Drug Use: No  . Sexual Activity: No   Other Topics Concern  . None   Social History Narrative   Walks daily. Divorced. Education: [employer].   Family History  Problem Relation Age of Onset  . Cancer Mother     vaginal cancer, also HTN  . Prostate cancer Father     also HTN, dementia  . Diabetes Brother     Allergies  Allergen Reactions  . Penicillins   . Sulfur    Prior to Admission medications   Medication Sig Start Date End Date Taking? Authorizing Provider  Esomeprazole Magnesium (NEXIUM PO) Take by mouth.   Yes Historical Provider, MD  imipramine (TOFRANIL) 25 MG tablet TAKE 3 TABLETS (75 MG TOTAL) BY MOUTH AT BEDTIME. 01/14/15  Yes [name], MD  simvastatin (ZOCOR) 40 MG tablet TAKE 1 TABLET (40 MG TOTAL) BY MOUTH DAILY AT BEDTIME. 03/22/15  Yes [name], MD  valsartan (DIOVAN) 40 MG tablet TAKE 1 TABLET (40 MG TOTAL) BY MOUTH DAILY. 01/14/15  Yes [name], MD     ROS: The patient denies fevers, chills, night sweats, unintentional weight loss, chest pain, palpitations, wheezing, dyspnea on exertion, nausea, vomiting, abdominal pain, dysuria, hematuria, melena, numbness, weakness, or tingling.   All other systems have been reviewed and were otherwise negative with the exception of those mentioned in the HPI and as above.    PHYSICAL EXAM: Filed Vitals:   05/18/15 1323  BP: 130/86  Pulse: 84  Temp: 98.1 F (36.7 C)  Resp: 16   Body mass  index is 30.02 kg/(m^2).   General: Alert, no acute distress HEENT:  Normocephalic, atraumatic, oropharynx patent. Eye: [name] [facility] Cardiovascular:  Regular rate and rhythm, no rubs murmurs or gallops.  No Carotid bruits, radial pulse intact. No pedal edema. Repeat blood pressure: 146/82. Respiratory: Clear to auscultation bilaterally.  No wheezes, rales, or rhonchi.  No cyanosis, no use of accessory musculature Abdominal: No organomegaly, abdomen is soft and non-tender, positive bowel sounds.  No masses. Musculoskeletal: Gait intact. No edema, tenderness Skin: No rashes. Neurologic: Facial musculature symmetric. No residual abnormalities from previous stroke. Psychiatric: Patient acts appropriately throughout our interaction. Lymphatic: No cervical or submandibular lymphadenopathy Genitourinary/Anorectal: No acute  findings    LABS: Results for orders placed or performed in visit on 05/18/15  POCT glucose (manual entry)  Result Value Ref Range   POC Glucose 117 (A) 70 - 99 mg/dl  POCT glycosylated hemoglobin (Hb A1C)  Result Value Ref Range   Hemoglobin A1C 6.0      EKG/XRAY:   Primary read interpreted by Dr. Everlene Farrier at [facility].   ASSESSMENT/PLAN: 1. Hyperglycemia Results for orders placed or performed in visit on 05/18/15  POCT glucose (manual entry)  Result Value Ref Range   POC Glucose 117 (A) 70 - 99 mg/dl  POCT glycosylated hemoglobin (Hb A1C)  Result Value Ref Range   Hemoglobin A1C 6.0    - POCT glucose (manual entry) - POCT glycosylated hemoglobin (Hb A1C) No change in medication.   2. Essential hypertension Increase Diovan to 80 mg a day   3. CVA (cerebral vascular accident) Patient has follow-up visit with the neurologist. She will avoid aspirin.   I personally performed the services described in this documentation, which was scribed in my presence. The recorded information has been reviewed and is accurate.  [name], MD  [facility], [place]  05/18/2015 2:46 PM I personally performed the services described in this documentation, which was scribed in my presence. The recorded information has been reviewed and is accurate.  [name], MD  [facility], [place]  05/18/2015 2:45 PM   Gross sideeffects, risk and benefits, and alternatives of medications d/w patient. Patient is aware that all medications have potential sideeffects and we are unable to predict every sideeffect or drug-drug interaction that may occur.  [name] MD 05/18/2015 1:43 PM

## 2015-05-18 NOTE — Telephone Encounter (Signed)
Patient called requesting to follow up with RN regarding as to whether Dr Leonie Man will ok for to travel to [facility] on Friday 05/21/15. She will not be driving. This is her granddaughter's wedding. She is not flying. Please call and advise. Patient can be reached at [phone].

## 2015-05-18 NOTE — Telephone Encounter (Signed)
Called spoke to with results of Bone Density (04/06/2015) advised her to continue with calcium and vitamin D, per Dr Everlene Farrier.

## 2015-05-19 ENCOUNTER — Other Ambulatory Visit: Payer: Self-pay

## 2015-05-19 ENCOUNTER — Encounter: Payer: Self-pay | Admitting: Emergency Medicine

## 2015-05-19 MED ORDER — IMIPRAMINE HCL 25 MG PO TABS: ORAL_TABLET | ORAL | Status: DC

## 2015-05-31 ENCOUNTER — Encounter: Payer: Self-pay | Admitting: Emergency Medicine

## 2015-06-16 ENCOUNTER — Ambulatory Visit (INDEPENDENT_AMBULATORY_CARE_PROVIDER_SITE_OTHER): Payer: Medicare Other | Admitting: Neurology

## 2015-06-16 ENCOUNTER — Encounter: Payer: Self-pay | Admitting: Neurology

## 2015-06-16 VITALS — BP 136/78 | HR 78 | Resp 20 | Ht 63.5 in | Wt 171.0 lb

## 2015-06-16 DIAGNOSIS — F518 Other sleep disorders not due to a substance or known physiological condition: Secondary | ICD-10-CM | POA: Diagnosis not present

## 2015-06-16 DIAGNOSIS — G47411 Narcolepsy with cataplexy: Secondary | ICD-10-CM | POA: Diagnosis not present

## 2015-06-16 DIAGNOSIS — G4710 Hypersomnia, unspecified: Secondary | ICD-10-CM | POA: Insufficient documentation

## 2015-06-16 DIAGNOSIS — R0683 Snoring: Secondary | ICD-10-CM | POA: Diagnosis not present

## 2015-06-16 DIAGNOSIS — I629 Nontraumatic intracranial hemorrhage, unspecified: Secondary | ICD-10-CM | POA: Diagnosis not present

## 2015-06-16 DIAGNOSIS — R6889 Other general symptoms and signs: Secondary | ICD-10-CM | POA: Insufficient documentation

## 2015-06-16 MED ORDER — IMIPRAMINE HCL 25 MG PO TABS: 25.0000 mg | ORAL_TABLET | Freq: Every day | ORAL | Status: DC

## 2015-06-16 MED ORDER — MODAFINIL 100 MG PO TABS: 100.0000 mg | ORAL_TABLET | Freq: Every day | ORAL | Status: DC

## 2015-06-16 NOTE — Progress Notes (Signed)
Tracey Richard   Provider:  Larey Richard, M D  Referring Provider: [name], MD Primary Care Physician:  Tracey Reichmann, MD  Chief Complaint  Patient presents with  . sleep consult    pt reports that since she retired, she sleeps during the day and stays up at night, rm 11, alone   Chief complaint according to patient :"  I may have apnea, had a stroke "   HPI:  Tracey Richard is a 74 y.o. female , seen here as a referral from Tracey Richard for a sleep consultation,   [name] states that lifelong she has been a good sleeper. She always has been easy to go to sleep and stay asleep, and that she requires 12-14 hours of sleep easily. She may still take naps, lasting several hours. She remembers being a sleepy teenager. She married at age 51 - and her husband mentioned to her thatshe spneds more time in bed than anyone he ever knew. She would Tr[place] up with headaches. This  summer she went with friends to the [place] area by car and she slept throughout the trip,  she does not remember seeing the [place]!  She sleeps for many years on 3 tabs of imipramine, originally, decades ago , prescribed for headaches and pain (?).  She was hospitalized for possible stroke-TIA on 04/21/2015 and during the observation. Nurses had witnessed her not just to snore but to stop breathing intermittently. The patient then saw her primary care physician, Dr. Ivar Richard. His note from 05-07-15 reflects that the patient had a hemorrhagic stroke and therefore is not taking any baby aspirin she had a very mild headache no gait abnormality no motor abnormality since a stroke. She usually has well-controlled hypertension and diabetes he mentioned. The stroke affected the left basal ganglia and was occurring on 6-29.  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 She also has undergone spine surgery.   Sleep habits are as follows: since her retirement in June 2011 she goes to bed later , watches more TV and  works late at the computer screen.  She does not have an established bedtime but her habit is to go to bed at 3-4 Am- and will sleep for at least 8 hours. She reports that she has vivid dreams every night, often if she wakes out off dream stages she will go back to bed and continues the same story line.  She does not report any sleep paralysis but dream intrusion. She is falling asleep very easily when not stimulated or physically active. Yesterday she took a 20 minute power nap and this refresh as her. During her nocturnal sleep she does not have to go to the bathroom or rarely. Her gentleman friend states that she does snore but he has not noted her to have apnea or stop to breathe. She is also not restlessly tossing and turning. He has noted her to twitch or kick at night. She denies any irresistible urge to move before bedtime or before going to sleep. Her boyfriend is a early bird , but he gets a usually sleep until 8 or 9 AM at least. This especially when that does travel together. She drinks coffee in the morning about 2 cups, she rarely drinks iced tea or sodas and not in the afternoon.  Sleep medical history and family sleep history:  She does not recall hearing her parents or siblings snoring.   Social history: divorced,  No biological children.   Review of Systems: Out of a complete 14 system review, the patient complains of only the following symptoms, and all other reviewed systems are negative.  EDS  Epworth score 17 , Fatigue severity score 48  , depression score 2   Social History   Social History  . Marital Status: Single    Spouse Name: N/A  . Number of Children: N/A  . Years of Education: N/A   Occupational History  . retired    Social History Main Topics  . Smoking status:  Never Smoker   . Smokeless tobacco: Never Used  . Alcohol Use: No  . Drug Use: No  . Sexual Activity: No   Other Topics Concern  . Not on file   Social History Narrative   Walks daily. Divorced. Education: [employer].      Drinks 4 cups of caffeine daily.    Family History  Problem Relation Age of Onset  . Cancer Mother     vaginal cancer, also HTN  . Prostate cancer Father     also HTN, dementia  . Diabetes Brother     Past Medical History  Diagnosis Date  . Cancer   . GERD (gastroesophageal reflux disease)   . Hyperlipidemia   . Hypertension   . Allergy   . Cataract   . Stroke     Past Surgical History  Procedure Laterality Date  . Tonsillectomy  1959  . Back surgery  2007  . Appendectomy  1963  . [place]  . Breast surgery    . Spine surgery    . Eye surgery  1999    bilateral cataract extraction  . Dilation and curettage of uterus      Current Outpatient Prescriptions  Medication Sig Dispense Refill  . Esomeprazole Magnesium (Tracey Richard) Take by mouth.    Marland Kitchen imipramine (TOFRANIL) 25 MG tablet TAKE 3 TABLETS (75 MG TOTAL) BY MOUTH AT BEDTIME. 270 tablet 1  . simvastatin (ZOCOR) 40 MG tablet TAKE 1 TABLET (40 MG TOTAL) BY MOUTH DAILY AT BEDTIME. 90 tablet 0  . valsartan (DIOVAN) 80 MG tablet Take 1 tablet (80 mg total) by mouth daily. 30 tablet 11   No current facility-administered medications for this visit.    Allergies as of 06/16/2015 - Review Complete 06/16/2015  Allergen Reaction Noted  . Penicillins  06/21/2011  . Sulfur  06/21/2011    Vitals: BP 136/78 mmHg  Pulse 78  Resp 20  Ht 5' 3.5" (1.613 m)  Wt 171 lb (77.565 kg)  BMI 29.81 kg/m2 Last Weight:  Wt Readings from Last 1 Encounters:  06/16/15 171 lb (77.565 kg)   IWL:NLGX mass index is 29.81 kg/(m^2).     Last Height:   Ht Readings from Last 1 Encounters:  06/16/15 5' 3.5" (1.613 m)    Physical exam:  General: The patient is awake, alert and appears not in acute distress.  The patient is well groomed. Head: Normocephalic, atraumatic. Neck is supple. Mallampati 3  neck circumference: 15.5 . Nasal airflow unrestricted, TMJ is not evident . Retrognathia is not seen.  Cardiovascular:  Regular rate and rhythm , without  murmurs or carotid bruit, and without distended neck veins. Respiratory: Lungs are clear to auscultation. Skin:  Without evidence of edema, or rash Trunk: BMI is 30. The patient's posture is  erect.  Neurologic exam : The patient is awake and alert, oriented to place and time.   Memory subjective described as intact.   Attention span & concentration ability appears normal.  Speech is fluent,  without dysarthria, dysphonia or aphasia.  Mood and affect are appropriate.  Cranial nerves: Pupils are equal and briskly reactive to light. Funduscopic exam without  evidence of pallor or edema.  Extraocular movements  in vertical and horizontal planes intact and without nystagmus. Visual fields by finger perimetry are intact. Hearing to finger rub intact.   Facial sensation intact to fine touch.  Facial motor strength is symmetric and tongue and uvula move midline. Shoulder shrug was symmetrical.   Motor exam:  Normal tone, muscle bulk and symmetric strength in all extremities. Sensory:  Fine touch, pinprick and vibration were tested in all extremities. Proprioception tested in the upper extremities was normal. Coordination: Rapid alternating movements in the fingers/hands was normal. Finger-to-nose maneuver  normal without evidence of ataxia, dysmetria or tremor. Gait and station: Patient walks without assistive device and is able unassisted to climb up to the exam table. Strength within normal limits.  Stance is stable and normal.  Toe and hell stand were tested .Tandem gait is unfragmented. Turns with 3-4  Steps. Romberg testing is negative.  Deep tendon reflexes: in the  upper and lower extremities are symmetric and intact. Babinski maneuver response  is downgoing.  The patient was advised of the nature of the diagnosed sleep disorder , the treatment options and risks for general a health and wellness arising from not treating the condition.  I spent more than 40 minutes of face to face time with the patient. Greater than 50% of time was spent in counseling and coordination of care. We have discussed the diagnosis and differential and I answered the patient's questions.     Assessment:  After physical and neurologic examination, review of laboratory studies,  Personal review of imaging studies, reports of other /same  Imaging studies ,  Results of polysomnography/ neurophysiology testing and pre-existing records as far as provided in visit., my assessment is   1) snoring and hypersomnia. Vivid dreams- but poor sleep habits. We will screen this patient for sleep apnea given her high fatigue score and her highly elevated Epworth sleepiness score of 17 points. She does report very vivid dreams and dream intrusion and falling asleep easily but she has not reported cataplexy or sleep paralysis.   I'm still interested in ruling out sleep apnea, and I am concerned that she may have narcolepsy.  2) The irresistable urge to go to sleep , no matter where and at what time.   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 this patient is on IMIPRAMINE which supresses cataplexy.      Plan:  Treatment plan and additional workup :  1)SPLIT night polysomnography -  Rule out apnea or confirm the degree of apnea, versus UARS  2) if no apnea is found , stay for MSLT. 3) HLA Narcolepsy test.  4) increase safety for operating machinery - prescribed low dose modafinil.     Asencion Partridge Naya Ilagan MD  06/16/2015   CC: [place]acey Richard, [place], [place] [id number]

## 2015-06-16 NOTE — Patient Instructions (Signed)
Hypersomnia Hypersomnia usually brings recurrent episodes of excessive daytime sleepiness or prolonged nighttime sleep. It is different than feeling tired due to lack of or interrupted sleep at night. People with hypersomnia are compelled to nap repeatedly during the day. This is often at inappropriate times such as:  At work.  During a meal.  In conversation. These daytime naps usually provide no relief. This disorder typically affects adolescents and young adults. CAUSES  This condition may be caused by:  Another sleep disorder (such as narcolepsy or sleep apnea).  Dysfunction of the autonomic nervous system.  Drug or alcohol abuse.  A physical problem, such as:  A tumor.  Head trauma. This is damage caused by an accident.  Injury to the central nervous system.  Certain medications, or medicine withdrawal.  Medical conditions may contribute to the disorder, including:  Multiple sclerosis.  Depression.  Encephalitis.  Epilepsy.  Obesity.  Some people appear to have a genetic predisposition to this disorder. In others, there is no known cause. SYMPTOMS   Patients often have difficulty waking from a long sleep. They may feel dazed or confused.  Other symptoms may include:  Anxiety.  Increased irritation (inflammation).  Decreased energy.  Restlessness.  Slow thinking.  Slow speech.  Loss of appetite.  Hallucinations.  Memory difficulty.  Tremors, Tics.  Some patients lose the ability to function in family, social, occupational, or other settings. TREATMENT  Treatment is symptomatic in nature. Stimulants and other drugs may be used to treat this disorder. Changes in behavior may help. For example, avoid night work and social activities that delay bed time. Changes in diet may offer some relief. Patients should avoid alcohol and caffeine. PROGNOSIS  The likely outcome (prognosis) for persons with hypersomnia depends on the cause of the disorder.  The disorder itself is not life threatening. But it can have serious consequences. For example, automobile accidents can be caused by falling asleep while driving. The attacks usually continue indefinitely. Document Released: 09/29/2002 Document Revised: 01/01/2012 Document Reviewed: 09/02/2008 [facility] Patient Information 2015 [place], [place]. This information is not intended to replace advice given to you by your health care provider. Make sure you discuss any questions you have with your health care provider.  Rv after PSG and MSLT with Dr Brett Fairy, MD   Your stroke physician , Dr Leonie Man , has an appointment with you in September

## 2015-06-17 ENCOUNTER — Telehealth: Payer: Self-pay | Admitting: Neurology

## 2015-06-17 DIAGNOSIS — H40013 Open angle with borderline findings, low risk, bilateral: Secondary | ICD-10-CM | POA: Diagnosis not present

## 2015-06-17 DIAGNOSIS — H04123 Dry eye syndrome of bilateral lacrimal glands: Secondary | ICD-10-CM | POA: Diagnosis not present

## 2015-06-17 MED ORDER — IMIPRAMINE HCL 25 MG PO TABS: ORAL_TABLET | ORAL | Status: DC

## 2015-06-17 NOTE — Telephone Encounter (Signed)
I called back again.  Spoke with Remo Lipps, who was not able to assist and transferred me to [place].  I verified Imipramine Rx.  He also wanted to verify Modafinil.  Rx says one daily, but he indicates it has one half every morning on it as well, so would like to confirm directions.  Should it be one half to one tab daily?  Please advise.  Thank you.

## 2015-06-17 NOTE — Telephone Encounter (Signed)
I want Tracey Richard to start with a half tablet per day of modafinil and warned her that it could give her headaches in the first 14 days of use. If this is not the case and she doesn't get jittery or has any other side effects she can advance to a full tablet. C. Ellwyn Ergle

## 2015-06-17 NOTE — Telephone Encounter (Signed)
I called back.  Spoke with pahrmacist and relyed providers instruction.  They will fill Rx as ordered and call back if anything further is needed.

## 2015-06-17 NOTE — Progress Notes (Signed)
I  Assigned the  assessment and plan as directed -The patient is known to me .   Afsana Liera, MD

## 2015-06-17 NOTE — Telephone Encounter (Signed)
Benay Spice with [facility] is calling regarding the patient. [place] needs clarification on directions for medications imipramine (TOFRANIL) 25 MG tablet and modafinil (PROVIGIL) 100 MG tablet for the patient. Thank you.

## 2015-06-17 NOTE — Telephone Encounter (Signed)
I called back.  Got a message saying the pharmacy is closed for lunch, and call back after 2:30pm.

## 2015-06-25 ENCOUNTER — Ambulatory Visit (INDEPENDENT_AMBULATORY_CARE_PROVIDER_SITE_OTHER): Payer: Medicare Other | Admitting: Neurology

## 2015-06-25 ENCOUNTER — Encounter: Payer: Self-pay | Admitting: Neurology

## 2015-06-25 VITALS — BP 105/67 | HR 66 | Ht 63.0 in | Wt 170.4 lb

## 2015-06-25 DIAGNOSIS — I671 Cerebral aneurysm, nonruptured: Secondary | ICD-10-CM | POA: Insufficient documentation

## 2015-06-25 NOTE — Patient Instructions (Signed)
I had a long discussion with the patient and her friend regarding her recent intracerebral hemorrhage, reviewed recent hospital imaging studies, hospital evaluation and recommend she maintain strict control of hypertension with blood pressure goal below 130/90. She also has intracranial aneurysm and I  discussed risk of rupture, conservative treatment versus role of endovascular and neurosurgical treatment and answered questions. recommend repeating MRI scan of the brain with and without contrast to follow the brain hemorrhage and look for any underlying vascular abnormality and MRA of the brain to follow the brain aneurysm. She was advised to return for follow-up in 6 months or call earlier if necessary. Cerebral Aneurysm An aneurysm is the bulging or ballooning out of part of the weakened wall of a vein or artery. An aneurysm in the vein or artery of the brain is called a brain aneurysm, or cerebral aneurysm.  Aneurysms are a risk to your health because they may leak or rupture. Once the aneurysm leaks or ruptures, bleeding occurs. If the bleeding occurs within the brain tissue, the condition is called an intracerebral hemorrhage. An intracerebral hemorrhage can result in a hemorrhagic stroke. If the bleeding occurs in the area between the brain and the thin tissues that cover the brain, the condition is called a subarachnoid hemorrhage. This increases the pressure on the brain and causes some areas of the brain to not get the necessary blood flow. The blood from the ruptured aneurysm collects and presses on the surrounding brain tissue. A subarachnoid hemorrhage can cause a stroke. A ruptured cerebral aneurysm is a medical emergency. This can cause permanent damage and loss of brain function. CAUSES A cerebral aneurysm is caused when a weakened part of the blood vessel expands. The blood vessel expands due to the constant pressure from the flow of blood through the weakened blood vessel. Usually the aneurysm  expands slowly. As the weakened aneurysm expands, the walls of the aneurysm become weaker. Aneurysms may be associated with diseases that weaken and damage the walls of your blood vessels or blood vessels that develop abnormally. Some known causes for cerebral aneurysms are:  Head trauma.  Infection.  Use of "recreational drugs" such as cocaine or amphetamines. RISK FACTORS People at risk for a cerebral aneurysm or hemorrhagic stroke usually have one or more risk factors, which include:  Having high blood pressure (hypertension).  Abusing alcohol.  Having abnormal blood vessels present since birth.  Having certain bleeding disorders, such as hemophilia, sickle cell disease, or liver disease.  Taking blood thinners (anticoagulants).  Smoking. SIGNS AND SYMPTOMS  The signs and symptoms of an unruptured cerebral aneurysm will partly depend on its size and rate of growth. A small, unchanging aneurysm generally does not produce symptoms. A larger aneurysm that is steadily growing can increase pressure on the brain or nerves. That increased pressure from the unruptured cerebral aneurysm can cause:  A headache.  Problems with your vision.  Numbness or weakness in an arm or leg.  Problems with memory.  Problems speaking.  Seizures. If an aneurysm leaks or bursts, it can cause a stroke and be life-threatening. Symptoms may include:  A sudden, severe headache with no known cause. The headache is often described as the worst headache ever experienced.  Nausea or vomiting, especially when combined with other symptoms such as a headache.  Sudden weakness or numbness of the face, arm, or leg, especially on one side of the body.  Sudden trouble walking or difficulty moving arms or legs.  Sudden confusion.  Sudden personality changes.  Trouble speaking (aphasia) or understanding.  Difficulty swallowing.  Sudden trouble seeing in one or both eyes.  Double  vision.  Dizziness.  Loss of balance or coordination.  Intolerance to light.  Stiff neck. DIAGNOSIS  A CTA (computed tomographic angiography) may be performed to diagnose an aneurysm. A CTA uses dye and a CT scanner to take images of your blood vessels. An MRA (magnetic resonance angiography) may be used to diagnose an aneurysm. An MRA is performed in an MRI machine. While in the MRI machine, images of your blood vessels are taken. A cerebral aneurysm may also be diagnosed with a cerebral angiogram. A cerebral angiogram requires a tube called a catheter to be inserted into a blood vessel and advanced to the blood vessels in your neck. Dye is then injected while X-ray images are taken to show the blood vessels in your brain. TREATMENT  Unruptured Aneurysms Treatment is complex when an aneurysm is found and it is not causing problems. Treatment is very individualized, as each case is different. Many things must be considered, such as the size and exact location of your aneurysm, your age, your overall health, and your feelings and preferences. Small aneurysms in certain locations of the brain have a very low chance of bleeding or rupturing. These small aneurysms may not be treated. However, depending on the size and location of the aneurysm, treatments may be recommended and include:  Coiling. During this procedure, a catheter is inserted and advanced through a blood vessel. Once the catheter reaches the aneurysm, tiny coils are used to block blood flow into the aneurysm.  Surgical clipping. During surgery, a clip is placed at the base of the aneurysm. The clip prevents blood from continuing to enter the aneurysm. Ruptured Aneurysms Immediate emergency surgery may be needed to help prevent damage to the brain and to reduce the risk of rebleeding. Timing of treatment is an important factor in the prevention of complications. Successful early treatment of a ruptured aneurysm (within the first 3 days  of a bleed) helps to prevent rebleeding and blood vessel spasm. In some cases, there may be a reason to treat later (10-14 days after a rupture). Many things are considered when making this decision, and each case is handled individually. HOME CARE INSTRUCTIONS  Take medicines only as instructed by your health care provider.  Eat healthy foods. It is recommended that you eat 5 or more servings of fruits and vegetables each day. Foods may need to be a special consistency (soft or pureed), or small bites may need to be taken if you have had a ruptured aneurysm or stroke. Certain dietary changes may be advised to address high blood pressure, high cholesterol, diabetes, or obesity.  Food choices that are low in salt (sodium), saturated fat, trans fat, and cholesterol are recommended to manage high blood pressure.  Food choices that are high in fiber and low in saturated fat, trans fat, and cholesterol are recommended to control cholesterol levels.  Controlling carbohydrate and sugar intake is recommended to manage diabetes.  Reducing calorie intake and making food choices that are low in sodium, saturated fat, trans fat, and cholesterol are recommended to manage obesity.  Maintain a healthy weight.  Stay physically active. It is recommended that you get at least 30 minutes of activity on most or all days.  Do not smoke.  Limit alcohol use. Moderate alcohol use is considered to be:  No more than 2 drinks each day for men.  No more than 1 drink each day for nonpregnant women.  Stop drug abuse.  A safe home environment is important to reduce the risk of falls. Your health care provider may arrange for specialists to evaluate your home. Having grab bars in the bedroom and bathroom is often important. Your health care provider may arrange for special equipment to be used at home, such as raised toilets and a seat for the shower.  Physical, occupational, and speech therapy. Ongoing therapy may  be needed to maximize your recovery after a ruptured aneurysm or stroke. If you have been advised to use a walker or a cane, use it at all times. Be sure to keep your therapy appointments.  Follow all instructions for follow-up with your health care provider. This is very important. This includes any referrals, physical therapy, rehabilitation, and laboratory tests. Proper follow-up may prevent an aneurysm rupture or a stroke. SEEK IMMEDIATE MEDICAL CARE IF:  You have a sudden, severe headache with no known cause.  You have sudden nausea or vomiting with a severe headache.  You have sudden weakness or numbness of the face, arm, or leg, especially on one side of the body.  You have sudden trouble walking or difficulty moving arms or legs.  You have sudden confusion.  You have trouble speaking or understanding.  You have sudden trouble seeing in one or both eyes.  You have a sudden loss of balance or coordination.  You have a stiff neck.  You have difficulty breathing.  You have a partial or total loss of consciousness. Any of these symptoms may represent a serious problem that is an emergency. Do not wait to see if the symptoms will go away. Get medical help at once. Call your local emergency services (911 in [place] Do not drive yourself to the hospital. Document Released: 07/01/2002 Document Revised: 02/23/2014 Document Reviewed: 03/27/2013 [facility] Patient Information 2015 [place], [place]. This information is not intended to replace advice given to you by your health care provider. Make sure you discuss any questions you have with your health care provider.

## 2015-06-25 NOTE — Progress Notes (Signed)
[facility] [address] [place]. [place] [address] [phone]       OFFICE FOLLOW-UP NOTE  Ms. BRYCELYN GAMBINO Date of Birth:  Apr 09, 1941 Medical Record Number:  [id number]   HPI: Ms Schooley is a 74 year old Caucasian lady seen today for first office follow-up visit following admission for intracerebral hemorrhage in June 2016.RAYLAN TROIANI is a 74 y.o. female who reports that yesterday she awakened normal. Went for a walk with her friend and noted that she was Walking as if drunk. She was going to the right. She was taken back home and went to bed early which is unusual for her. Awakened this afternoon and her gait was much better but was noted to drop the mail from her right hand. Also noted when she went out to dinner that she was dropping her fork. Presented for evaluation at that time. Patient is right handed.  Date last known well: Date: 04/19/2015 Time last known well: Time: 16:30 tPA Given: No: ICH.Marland Kitchen She was admitted to the intensive care unit where she underwent close neurological monitoring as well as tight blood pressure control. Her blood pressure was not particularly elevated and was he controlled. She was cleared by speech therapy and started on no medications. MRI scan was obtained later on the day of admission and confirmed left basal ganglia hemorrhage without any underlying lesion. There is no significant mass effect, midline shift or intraventricular extension. Patient underwent echocardiogram, carotid Doppler both of which were normal. She had only minimal weakness of the right hand muscles which was improving at the time of discharge. She was seen by physical occupational speech therapy and found to have no therapy needs and was felt stable to be discharged home . She has done well and has not had any recurrent neurological symptoms. She states her blood pressure is well controlled. She in fact traveled to [place] to attend her  granddaughter's wedding. She is independent in all activities of daily living. She is very active. She admits to mild short-term memory difficulties which she states these existed even prior to her hemorrhage and are not any worse now. 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 ROS:   14 system review of systems is positive for  drooling, blood in the urine, apnea, daytime sleepiness, snoring, sleep talking and all other systems negative  PMH:  Past Medical History  Diagnosis Date  . Cancer   . GERD (gastroesophageal reflux disease)   . Hyperlipidemia   . Hypertension   . Allergy   . Cataract   . Stroke     Social History:  Social History   Social History  . Marital Status: Single    Spouse Name: N/A  . Number of Children: N/A  . Years of Education: N/A   Occupational History  . retired    Social History Main Topics  . Smoking status: Never Smoker   . Smokeless tobacco: Never Used  . Alcohol Use: 0.6 oz/week    0 Standard drinks or equivalent, 1 Glasses of wine per week     Comment: 1 every 6 mts   . Drug Use: No  . Sexual Activity: No   Other Topics Concern  . Not on file   Social History Narrative   Walks daily. Divorced. Education: [employer].      Drinks 4 cups of caffeine daily.    Medications:   Current Outpatient  Prescriptions on File Prior to Visit  Medication Sig Dispense Refill  . Esomeprazole Magnesium (NEXIUM PO) Take by mouth.    Marland Kitchen imipramine (TOFRANIL) 25 MG tablet TAKE 3 TABLETS (75 MG TOTAL) BY MOUTH AT BEDTIME. 90 tablet 1  . modafinil (PROVIGIL) 100 MG tablet Take 1 tablet (100 mg total) by mouth daily. 30 tablet 2  . simvastatin (ZOCOR) 40 MG tablet TAKE 1 TABLET (40 MG TOTAL) BY MOUTH DAILY AT BEDTIME. 90 tablet 0  . valsartan (DIOVAN) 80 MG tablet Take 1 tablet (80 mg total) by mouth daily. 30 tablet 11   No current facility-administered medications on  file prior to visit.    Allergies:   Allergies  Allergen Reactions  . Penicillins   . Sulfur     Physical Exam General: well developed, well nourished, seated, in no evident distress Head: head normocephalic and atraumatic.  Neck: supple with no carotid or supraclavicular bruits Cardiovascular: regular rate and rhythm, no murmurs Musculoskeletal: no deformity Skin:  no rash/petichiae Vascular:  Normal pulses all extremities Filed Vitals:   06/25/15 1013  BP: 105/67  Pulse: 66   Neurologic Exam Mental Status: Awake and fully alert. Oriented to place and time. Recent and remote memory intact. Attention span, concentration and fund of knowledge appropriate. Mood and affect appropriate.  Cranial Nerves: Fundoscopic exam reveals sharp disc margins. Pupils equal, briskly reactive to light. Extraocular movements full without nystagmus. Visual fields full to confrontation. Hearing intact. Facial sensation intact. Face, tongue, palate moves normally and symmetrically.  Motor: Normal bulk and tone. Normal strength in all tested extremity muscles. Sensory.: intact to touch ,pinprick .position and vibratory sensation.  Coordination: Rapid alternating movements normal in all extremities. Finger-to-nose and heel-to-shin performed accurately bilaterally. Gait and Station: Arises from chair without difficulty. Stance is normal. Gait demonstrates normal stride length and balance . Able to heel, toe and tandem walk without difficulty.  Reflexes: 1+ and symmetric. Toes downgoing.   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 She is doing extremely well    PLAN: I had a long discussion with the patient and her friend regarding her recent intracerebral hemorrhage, reviewed recent hospital imaging studies, hospital evaluation and recommend she maintain  strict control of hypertension with blood pressure goal below 130/90. She also has intracranial aneurysm and I  discussed risk of rupture, conservative treatment versus role of endovascular and neurosurgical treatment and answered questions. recommend repeating MRI scan of the brain with and without contrast to follow the brain hemorrhage and look for any underlying vascular abnormality and MRA of the brain to follow the brain aneurysm.Greater than 50% of time during this 25 minute visit was spent on counseling, discussion with patient and family and coordination of care  She was advised to return for follow-up in 6 months or call earlier if necessary. Antony Contras, MD Note: This document was prepared with digital dictation and possible smart phrase technology. Any transcriptional errors that result from this process are unintentional

## 2015-07-01 ENCOUNTER — Other Ambulatory Visit: Payer: Self-pay | Admitting: Emergency Medicine

## 2015-07-08 ENCOUNTER — Ambulatory Visit
Admission: RE | Admit: 2015-07-08 | Discharge: 2015-07-08 | Disposition: A | Discharge status: Home / Self Care | Payer: Medicare Other | Source: Ambulatory Visit | Attending: Neurology | Admitting: Neurology

## 2015-07-08 DIAGNOSIS — I671 Cerebral aneurysm, nonruptured: Secondary | ICD-10-CM

## 2015-07-08 DIAGNOSIS — I629 Nontraumatic intracranial hemorrhage, unspecified: Secondary | ICD-10-CM | POA: Diagnosis not present

## 2015-07-08 MED ORDER — GADOBENATE DIMEGLUMINE 529 MG/ML IV SOLN
15.0000 mL | Freq: Once | INTRAVENOUS | Status: AC | PRN
  Administered 2015-07-08: 15 mL via INTRAVENOUS

## 2015-07-14 ENCOUNTER — Ambulatory Visit (INDEPENDENT_AMBULATORY_CARE_PROVIDER_SITE_OTHER): Payer: Medicare Other | Admitting: Neurology

## 2015-07-14 DIAGNOSIS — R0683 Snoring: Secondary | ICD-10-CM

## 2015-07-14 DIAGNOSIS — G4710 Hypersomnia, unspecified: Secondary | ICD-10-CM | POA: Diagnosis not present

## 2015-07-14 DIAGNOSIS — G47411 Narcolepsy with cataplexy: Secondary | ICD-10-CM

## 2015-07-14 DIAGNOSIS — R6889 Other general symptoms and signs: Secondary | ICD-10-CM

## 2015-07-15 NOTE — Sleep Study (Signed)
Please see the scanned sleep study interpretation located in the procedure tab in the chart view section.  

## 2015-07-26 ENCOUNTER — Telehealth: Payer: Self-pay

## 2015-07-26 DIAGNOSIS — G4733 Obstructive sleep apnea (adult) (pediatric): Secondary | ICD-10-CM

## 2015-07-26 NOTE — Telephone Encounter (Signed)
Spoke to pt regarding sleep study results. 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 Pt says that her legs have never bothered her like that before, just during her sleep study. I advised her that Dr. Brett Fairy is going to order a cpap titration, and once she gets used to cpap, she will order an MSLT. Pt verbalized understanding.

## 2015-07-26 NOTE — Telephone Encounter (Signed)
Patient is returning your call. Please call @[phone].  Thanks!

## 2015-07-26 NOTE — Telephone Encounter (Signed)
Called pt to give her sleep study results. No answer, left a message asking her to call back.

## 2015-07-27 ENCOUNTER — Encounter: Payer: Self-pay | Admitting: Emergency Medicine

## 2015-08-02 ENCOUNTER — Other Ambulatory Visit: Payer: Self-pay | Admitting: Emergency Medicine

## 2015-08-10 ENCOUNTER — Encounter: Payer: Self-pay | Admitting: Emergency Medicine

## 2015-08-10 ENCOUNTER — Ambulatory Visit (INDEPENDENT_AMBULATORY_CARE_PROVIDER_SITE_OTHER): Payer: Medicare Other | Admitting: Emergency Medicine

## 2015-08-10 VITALS — BP 128/81 | HR 70 | Temp 98.7°F | Resp 16 | Ht 63.0 in | Wt 168.0 lb

## 2015-08-10 DIAGNOSIS — E785 Hyperlipidemia, unspecified: Secondary | ICD-10-CM | POA: Diagnosis not present

## 2015-08-10 DIAGNOSIS — I10 Essential (primary) hypertension: Secondary | ICD-10-CM | POA: Diagnosis not present

## 2015-08-10 DIAGNOSIS — Z23 Encounter for immunization: Secondary | ICD-10-CM

## 2015-08-10 DIAGNOSIS — R739 Hyperglycemia, unspecified: Secondary | ICD-10-CM | POA: Diagnosis not present

## 2015-08-10 LAB — LIPID PANEL
Cholesterol: 167 mg/dL (ref 125–200)
HDL: 46 mg/dL (ref >=46)
LDL CALC: 100 mg/dL (ref <130)
Total CHOL/HDL Ratio: 3.6 Ratio (ref <=5.0)
Triglycerides: 104 mg/dL (ref <150)
VLDL: 21 mg/dL (ref <30)

## 2015-08-10 LAB — BASIC METABOLIC PANEL WITH GFR
BUN: 12 mg/dL (ref 7–25)
CALCIUM: 9 mg/dL (ref 8.6–10.4)
CO2: 28 mmol/L (ref 20–31)
CREATININE: 0.9 mg/dL (ref 0.60–0.93)
Chloride: 105 mmol/L (ref 98–110)
GFR, Est African American: 74 mL/min (ref >=60)
GFR, Est Non African American: 64 mL/min (ref >=60)
GLUCOSE: 103 mg/dL — AB (ref 65–99)
Potassium: 4 mmol/L (ref 3.5–5.3)
SODIUM: 139 mmol/L (ref 135–146)

## 2015-08-10 LAB — POCT GLYCOSYLATED HEMOGLOBIN (HGB A1C): Hemoglobin A1C: 5.6

## 2015-08-10 LAB — GLUCOSE, POCT (MANUAL RESULT ENTRY): POC Glucose: 103 mg/dl — AB (ref 70–99)

## 2015-08-10 LAB — HEMOGLOBIN A1C: Hgb A1c MFr Bld: 5.6 % (ref 4.0–6.0)

## 2015-08-10 NOTE — Progress Notes (Addendum)
Patient ID: Tracey Richard, female   DOB: [birth year], 74 y.o.   MRN: [id number]     This chart was scribed for Tracey Queen, MD by Zola Button, Medical Scribe. This patient was seen in room 21 and the patient's care was started at 10:52 AM.   Chief Complaint:  Chief Complaint  Patient presents with   Follow-up   Hypertension   prediabetes    HPI: Tracey Richard is a 74 y.o. female with a history of brain aneurysm, intracerebral hemorrhage, and hypertension who reports to [facility] today for a follow-up. Patient saw Dr. Leonie Man, neurologist, recently and had MR imaging of her head and brain. She has received the flu vaccine. She has been checking her blood pressure at home; her blood pressure has been normal.  Past Medical History  Diagnosis Date   Cancer ([place])    GERD (gastroesophageal reflux disease)    Hyperlipidemia    Hypertension    Allergy    Cataract    Stroke [facility])    Past Surgical History  Procedure Laterality Date   Tonsillectomy  1959   Back surgery  2007   Appendectomy  [place]   Breast surgery     Spine surgery     Eye surgery  1999    bilateral cataract extraction   Dilation and curettage of uterus     Social History   Social History   Marital Status: Single    Spouse Name: N/A   Number of Children: N/A   Years of Education: N/A   Occupational History   retired    Social History Main Topics   Smoking status: Never Smoker    Smokeless tobacco: Never Used   Alcohol Use: 0.6 oz/week    0 Standard drinks or equivalent, 1 Glasses of wine per week     Comment: 1 every 6 mts    Drug Use: No   Sexual Activity: No   Other Topics Concern   None   Social History Narrative   Walks daily. Divorced. Education: [employer].      Drinks 4 cups of caffeine daily.   Family History  Problem Relation Age of Onset   Cancer Mother     vaginal cancer, also HTN   Prostate cancer Father     also HTN, dementia    Diabetes Brother    Allergies  Allergen Reactions   Penicillins    Sulfur    Prior to Admission medications   Medication Sig Start Date End Date Taking? Authorizing Provider  Esomeprazole Magnesium (NEXIUM PO) Take by mouth.   Yes Historical Provider, MD  [place] by mouth.   Yes Historical Provider, MD  imipramine (TOFRANIL) 25 MG tablet TAKE 3 TABLETS (75 MG TOTAL) BY MOUTH AT BEDTIME. 06/17/15  Yes [name], MD  modafinil (PROVIGIL) 100 MG tablet Take 1 tablet (100 mg total) by mouth daily. 06/16/15  Yes Carmen Dohmeier, MD  simvastatin (ZOCOR) 40 MG tablet TAKE 1 TABLET (40 MG TOTAL) BY MOUTH DAILY AT BEDTIME. 07/02/15  Yes [name], MD  valsartan (DIOVAN) 80 MG tablet Take 1 tablet (80 mg total) by mouth daily. 05/18/15  Yes [name], MD     ROS: The patient denies fevers, chills, night sweats, unintentional weight loss, chest pain, palpitations, wheezing, dyspnea on exertion, nausea, vomiting, abdominal pain, dysuria, hematuria, melena.   All other systems have been reviewed and were otherwise negative with the exception  of those mentioned in the HPI and as above.    PHYSICAL EXAM: Filed Vitals:   08/10/15 1036  BP: 128/81  Pulse: 70  Temp: 98.7 F (37.1 C)  Resp: 16   Body mass index is 29.77 kg/(m^2).   General: Alert, no acute distress HEENT:  Normocephalic, atraumatic, oropharynx patent. Eye: [name] [facility] Cardiovascular:  Regular rate and rhythm, no rubs murmurs or gallops.  No Carotid bruits, radial pulse intact. No pedal edema. Repeat blood pressure: 132/80. Respiratory: Clear to auscultation bilaterally.  No wheezes, rales, or rhonchi.  No cyanosis, no use of accessory musculature Abdominal: No organomegaly, abdomen is soft and non-tender, positive bowel sounds.  No masses. Musculoskeletal: Gait intact. No edema, tenderness Skin: No rashes. Neurologic: Facial musculature symmetric. Psychiatric: Patient acts appropriately  throughout our interaction. Lymphatic: No cervical or submandibular lymphadenopathy    LABS: Results for orders placed or performed in visit on 08/10/15  POCT glucose (manual entry)  Result Value Ref Range   POC Glucose 103 (A) 70 - 99 mg/dl  POCT glycosylated hemoglobin (Hb A1C)  Result Value Ref Range   Hemoglobin A1C 5.6      EKG/XRAY:   Primary read interpreted by Dr. Everlene Farrier at [facility].   ASSESSMENT/PLAN: Patient doing great. She is going to see Dr. Leonie Man to discuss the results of her MRA with her. No change in medication.   By signing my name below, I, Zola Button, attest that this documentation has been prepared under the direction and in the presence of Tracey Queen, MD.  Electronically Signed: Zola Button, Medical Scribe. 08/10/2015. 10:52 AM.   [place] sideeffects, risk and benefits, and alternatives of medications d/w patient. Patient is aware that all medications have potential sideeffects and we are unable to predict every sideeffect or drug-drug interaction that may occur.  Tracey Queen MD 08/10/2015 10:52 AM

## 2015-08-10 NOTE — Progress Notes (Signed)
Patient ID: Tracey Richard, female   DOB: [birth year], 74 y.o.   MRN: [id number]     This chart was scribed for Tracey Queen, MD by Zola Button, Medical Scribe. This patient was seen in room 21 and the patient's care was started at 10:52 AM.   Chief Complaint:  Chief Complaint  Patient presents with  . Follow-up  . Hypertension  . prediabetes    HPI: [name] is a 74 y.o. female with a history of brain aneurysm, intracerebral hemorrhage, and hypertension who reports to [facility] today for a follow-up. Patient saw Dr. Leonie Man, neurologist, recently and had MR imaging of her head and brain. She has received the flu vaccine. She has been checking her blood pressure at home; her blood pressure has been normal.  Past Medical History  Diagnosis Date  . Cancer ([place])   . GERD (gastroesophageal reflux disease)   . Hyperlipidemia   . Hypertension   . Allergy   . Cataract   . Stroke [facility])    Past Surgical History  Procedure Laterality Date  . Tonsillectomy  1959  . Back surgery  2007  . Appendectomy  1963  . [place]  . Breast surgery    . Spine surgery    . Eye surgery  1999    bilateral cataract extraction  . Dilation and curettage of uterus     Social History   Social History  . Marital Status: Single    Spouse Name: N/A  . Number of Children: N/A  . Years of Education: N/A   Occupational History  . retired    Social History Main Topics  . Smoking status: Never Smoker   . Smokeless tobacco: Never Used  . Alcohol Use: 0.6 oz/week    0 Standard drinks or equivalent, 1 Glasses of wine per week     Comment: 1 every 6 mts   . Drug Use: No  . Sexual Activity: No   Other Topics Concern  . None   Social History Narrative   Walks daily. Divorced. Education: [employer].      Drinks 4 cups of caffeine daily.   Family History  Problem Relation Age of Onset  . Cancer Mother     vaginal cancer, also HTN  . Prostate cancer Father     also HTN, dementia  .  Diabetes Brother    Allergies  Allergen Reactions  . Penicillins   . Sulfur    Prior to Admission medications   Medication Sig Start Date End Date Taking? Authorizing Provider  Esomeprazole Magnesium (NEXIUM PO) Take by mouth.   Yes Historical Provider, MD  [place] by mouth.   Yes Historical Provider, MD  imipramine (TOFRANIL) 25 MG tablet TAKE 3 TABLETS (75 MG TOTAL) BY MOUTH AT BEDTIME. 06/17/15  Yes [name], MD  modafinil (PROVIGIL) 100 MG tablet Take 1 tablet (100 mg total) by mouth daily. 06/16/15  Yes Carmen Dohmeier, MD  simvastatin (ZOCOR) 40 MG tablet TAKE 1 TABLET (40 MG TOTAL) BY MOUTH DAILY AT BEDTIME. 07/02/15  Yes [name], MD  valsartan (DIOVAN) 80 MG tablet Take 1 tablet (80 mg total) by mouth daily. 05/18/15  Yes [name], MD     ROS: The patient denies fevers, chills, night sweats, unintentional weight loss, chest pain, palpitations, wheezing, dyspnea on exertion, nausea, vomiting, abdominal pain, dysuria, hematuria, melena.   All other systems have been reviewed and were otherwise negative with the exception  of those mentioned in the HPI and as above.    PHYSICAL EXAM: Filed Vitals:   08/10/15 1036  BP: 128/81  Pulse: 70  Temp: 98.7 F (37.1 C)  Resp: 16   Body mass index is 29.77 kg/(m^2).   General: Alert, no acute distress HEENT:  Normocephalic, atraumatic, oropharynx patent. Eye: [name] [facility] Cardiovascular:  Regular rate and rhythm, no rubs murmurs or gallops.  No Carotid bruits, radial pulse intact. No pedal edema. Repeat blood pressure: 132/80. Respiratory: Clear to auscultation bilaterally.  No wheezes, rales, or rhonchi.  No cyanosis, no use of accessory musculature Abdominal: No organomegaly, abdomen is soft and non-tender, positive bowel sounds.  No masses. Musculoskeletal: Gait intact. No edema, tenderness Skin: No rashes. Neurologic: Facial musculature symmetric. Psychiatric: Patient acts appropriately  throughout our interaction. Lymphatic: No cervical or submandibular lymphadenopathy    LABS: Results for orders placed or performed in visit on 08/10/15  POCT glucose (manual entry)  Result Value Ref Range   POC Glucose 103 (A) 70 - 99 mg/dl  POCT glycosylated hemoglobin (Hb A1C)  Result Value Ref Range   Hemoglobin A1C 5.6      EKG/XRAY:   Primary read interpreted by Dr. Everlene Farrier at [facility].   ASSESSMENT/PLAN: Patient doing great. She is going to see Dr. Leonie Man to discuss the results of her MRA with her. No change in medication.   By signing my name below, I, Zola Button, attest that this documentation has been prepared under the direction and in the presence of Tracey Queen, MD.  Electronically Signed: Zola Button, Medical Scribe. 08/10/2015. 12:19 PM.   Gross sideeffects, risk and benefits, and alternatives of medications d/w patient. Patient is aware that all medications have potential sideeffects and we are unable to predict every sideeffect or drug-drug interaction that may occur.  Tracey Queen MD 08/10/2015 12:19 PM

## 2015-08-16 ENCOUNTER — Encounter: Payer: Self-pay | Admitting: Family Medicine

## 2015-08-20 NOTE — Telephone Encounter (Signed)
Error

## 2015-08-22 ENCOUNTER — Ambulatory Visit (INDEPENDENT_AMBULATORY_CARE_PROVIDER_SITE_OTHER): Payer: Medicare Other | Admitting: Neurology

## 2015-08-22 DIAGNOSIS — G4733 Obstructive sleep apnea (adult) (pediatric): Secondary | ICD-10-CM | POA: Diagnosis not present

## 2015-08-22 NOTE — Sleep Study (Signed)
Please see the scanned sleep study interpretation located in the Procedure tab within the Chart Review section. 

## 2015-08-30 ENCOUNTER — Telehealth: Payer: Self-pay

## 2015-08-30 DIAGNOSIS — G4733 Obstructive sleep apnea (adult) (pediatric): Secondary | ICD-10-CM

## 2015-08-30 NOTE — Telephone Encounter (Signed)
Spoke to pt and advised her that her PAP titration results were reviewed by Dr. Brett Fairy and she recommends starting a cpap. Pt is willing to proceed with cpap. Pt reports that she felt refreshed after her titration study. I advised pt that I would be sending her order to [facility] and they would be calling her to set up the cpap. Pt verbalized understanding.  I also advised pt that to receive a copy of her sleep study, she would need to come to the office and sign a medical release form. Pt verbalized understanding.

## 2015-08-31 DIAGNOSIS — Z1231 Encounter for screening mammogram for malignant neoplasm of breast: Secondary | ICD-10-CM | POA: Diagnosis not present

## 2015-09-10 ENCOUNTER — Encounter: Payer: Self-pay | Admitting: Emergency Medicine

## 2015-09-22 ENCOUNTER — Telehealth: Payer: Self-pay | Admitting: Neurology

## 2015-09-22 NOTE — Telephone Encounter (Signed)
Pt called sts Ins sent letter sts she has to use [facility] [place].

## 2015-09-22 NOTE — Telephone Encounter (Signed)
Spoke to pt. She says that [place] sent her a letter saying that her CPAP was approved but she must use [facility] [place]. I had sent her order for cpap to [facility], because they accept Medicare and [employer]. I advised her that I would call [facility] and find out what the deal is.  I called [facility] and they are looking in to it.

## 2015-09-22 NOTE — Telephone Encounter (Signed)
Spoke to [employer]. [facility] is actually owned by [employer]. Heather at [employer] will be calling the pt to set it up today. Pushed pt's appt out farther, until 1/24 so she will have ample time on cpap before seeing Dr. Brett Fairy. Pt verbalized understanding to arrive 15 minutes early and bring her CPAP.

## 2015-09-28 DIAGNOSIS — G4733 Obstructive sleep apnea (adult) (pediatric): Secondary | ICD-10-CM | POA: Diagnosis not present

## 2015-10-06 ENCOUNTER — Other Ambulatory Visit: Payer: Self-pay | Admitting: Emergency Medicine

## 2015-10-28 ENCOUNTER — Ambulatory Visit: Payer: Self-pay | Admitting: Neurology

## 2015-10-29 DIAGNOSIS — G4733 Obstructive sleep apnea (adult) (pediatric): Secondary | ICD-10-CM | POA: Diagnosis not present

## 2015-11-16 ENCOUNTER — Ambulatory Visit (INDEPENDENT_AMBULATORY_CARE_PROVIDER_SITE_OTHER): Payer: Medicare Other | Admitting: Neurology

## 2015-11-16 ENCOUNTER — Encounter: Payer: Self-pay | Admitting: Neurology

## 2015-11-16 VITALS — BP 138/86 | HR 92 | Resp 20 | Ht 62.5 in | Wt 170.0 lb

## 2015-11-16 DIAGNOSIS — G4733 Obstructive sleep apnea (adult) (pediatric): Secondary | ICD-10-CM | POA: Diagnosis not present

## 2015-11-16 DIAGNOSIS — Z9989 Dependence on other enabling machines and devices: Principal | ICD-10-CM

## 2015-11-16 DIAGNOSIS — I10 Essential (primary) hypertension: Secondary | ICD-10-CM | POA: Diagnosis not present

## 2015-11-16 MED ORDER — IMIPRAMINE HCL 25 MG PO TABS: ORAL_TABLET | ORAL | Status: DC

## 2015-11-16 NOTE — Progress Notes (Signed)
SLEEP MEDICINE CLINIC   Provider:  [name], M D  Referring Provider: [name], MD Primary Care Physician:  Jenny Reichmann, MD  Chief Complaint  Patient presents with  . Follow-up    cpap going well, pt is not sleepy any longer, rm 10, alone   Chief complaint according to patient :"  I may have apnea, had a stroke "   HPI:  NEVE BURCHER is a 75 y.o. female , seen here as a referral from Dr. Everlene Farrier for a sleep consultation,   Mrs. Quinn states that lifelong she has been a good sleeper. She always has been easy to go to sleep and stay asleep, and that she requires 12-14 hours of sleep easily. She may still take naps, lasting several hours. She remembers being a sleepy teenager. She married at age 3 - and her husband mentioned to her thatshe spneds more time in bed than anyone he ever knew. She would wake up with headaches. This  summer she went with friends to the [place] area by car and she slept throughout the trip,  she does not remember seeing the [place]!  She sleeps for many years on 3 tabs of imipramine, originally, decades ago , prescribed for headaches and pain (?).  She was hospitalized for possible stroke-TIA on 04/21/2015 and during the observation. Nurses had witnessed her not just to snore but to stop breathing intermittently. The patient then saw her primary care physician, Dr. Ivar Bury. His note from 05-07-15 reflects that the patient had a hemorrhagic stroke and therefore is not taking any baby aspirin she had a very mild headache no gait abnormality no motor abnormality since a stroke. She usually has well-controlled hypertension and diabetes he mentioned. The stroke affected the left basal ganglia and was occurring on 6-29. 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 She  also has undergone spine surgery.  Sleep habits are as follows: since her retirement in June 2011 she goes to bed later , watches more TV and  works late at the computer screen.  She does not have an established bedtime but her habit is to go to bed at 3-4 Am- and will sleep for at least 8 hours. She reports that she has vivid dreams every night, often if she wakes out off dream stages she will go back to bed and continues the same story line.  She does not report any sleep paralysis but dream intrusion. She is falling asleep very easily when not stimulated or physically active. Yesterday she took a 20 minute power nap and this refresh as her. During her nocturnal sleep she does not have to go to the bathroom or rarely. Her gentleman friend states that she does snore but he has not noted her to have apnea or stop to breathe. She is also not restlessly tossing and turning. He has noted her to twitch or kick at night. She denies any irresistible urge to move before bedtime or before going to sleep. Her boyfriend is a early bird , but he gets a usually sleep until 8 or 9 AM at least. This especially when that does travel together. She drinks coffee in the morning about 2 cups, she rarely drinks iced tea or sodas and not in the afternoon.  Sleep medical history and family sleep history:  She does not recall  hearing her parents or siblings snoring. Social history:  HS, lives with a female friend, divorced,  No biological children.   Dr Sherron Monday note :      OFFICE FOLLOW-UP NOTE  Ms. RONNY REICHELT Date of Birth:  November 26, 1940 Medical Record Number:  [id number]   HPI: Ms Alcantara is a 75 year old Caucasian lady seen today for first office follow-up visit following admission for intracerebral hemorrhage in June 2016.KASHAWN DOZER is a 75 y.o. female who reports that yesterday she awakened normal. Went for a walk with her friend and noted that she was Walking as if drunk. She was going to the right.  She was taken back home and went to bed early which is unusual for her. Awakened this afternoon and her gait was much better but was noted to drop the mail from her right hand. Also noted when she went out to dinner that she was dropping her fork. Presented for evaluation at that time. Patient is right handed.  Date last known well: Date: 04/19/2015 Time last known well: Time: 16:30 tPA Given: No: ICH.Marland Kitchen She was admitted to the intensive care unit where she underwent close neurological monitoring as well as tight blood pressure control. Her blood pressure was not particularly elevated and was he controlled. She was cleared by speech therapy and started on no medications. MRI scan was obtained later on the day of admission and confirmed left basal ganglia hemorrhage without any underlying lesion. There is no significant mass effect, midline shift or intraventricular extension. Patient underwent echocardiogram, carotid Doppler both of which were normal. She had only minimal weakness of the right hand muscles which was improving at the time of discharge. She was seen by physical occupational speech therapy and found to have no therapy needs and was felt stable to be discharged home . She has done well and has not had any recurrent neurological symptoms. She states her blood pressure is well controlled. She in fact traveled to [place] to attend her granddaughter's wedding. She is independent in all activities of daily living. She is very active. She admits to mild short-term memory difficulties which she states these existed even prior to her hemorrhage and are not any worse now. 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  Interval history from 11/16/2015; Mrs. Meghana Kriley had extensive new medical events happening between her last visit with me and today. She was admitted on June 27 with a possible stroke, Dr. said his  note is to be seen above. I had ordered a sleep study for the patient in the meantime she had suffered a stroke. The date of recording for her polysomnography was 07-14-15. The study documented mild sleep apnea with an AHI of 7.5 in supine of 13. She did have an oxygen nadir at 87% saturation and 17.8 minutes of desaturation. She had significant sleep disruption from periodic limb movements twitching at night to the level that it aroused her from sleep. This index was 8.0 -I suggested that the patient should undergo treatment for the restless legs and  I will order a CPAP titration allowing the patient to get used to CPAP therapy before we may reschedule an MS LT - depending on her degree of sleepiness.  The date of the baseline sleep study she endorsed the Epworth score at 17 points. She was titrated to CPAP she did not feel the need to continue restless leg medication.  She was titrated to CPAP of 12 cm water and started on an auto CPAP between 5 and 15 with a nasal pillow mask. In the meantime she has changed to a full face mask feeling that this is more comfortable for her. She does not have issues with claustrophobia.  Her Epworth score has decreased to only 11 points, her hypertension is well treated and controlled at this point. Her fatigue score was endorsed at only 14 points. Reactive depression score at 0 points  The patient has been 100% compliant with CPAP use and over 90% compliance with the consecutive 4 hour use. Average user time daily 7 hours and 37 minutes. She is using the AutoSet between 5 and 15 with a 3 cm EPR, the 95th percentile pressure now is 14.2 cm water. The residual AHI is 4.3 which is a reduction of about 50%. She feels she sleeps well she is restored and refreshed in the morning and also she did not have a high degree of apnea the treatment of apnea seems to have reduced her periodic limb movements. There is no longer any evidence of restless legs or periodic limb movements  interrupting her sleep. The patient will continue to use the AutoSet at the current settings. The interface has not bothered her at all and the change to a full face mask did not lead to major air leaks. The nasal pillows caused nasal blisters.  Review of Systems: Out of a complete 14 system review, the patient complains of only the following symptoms, and all other reviewed systems are negative.  Epworth score  11 from 17 , Fatigue severity score 15 from  48  , depression score  0 from 2 pre CPAP !    Social History   Social History  . Marital Status: Single    Spouse Name: N/A  . Number of Children: N/A  . Years of Education: N/A   Occupational History  . retired    Social History Main Topics  . Smoking status: Never Smoker   . Smokeless tobacco: Never Used  . Alcohol Use: 0.6 oz/week    0 Standard drinks or equivalent, 1 Glasses of wine per week     Comment: 1 every 6 mts   . Drug Use: No  . Sexual Activity: No   Other Topics Concern  . Not on file   Social History Narrative   Walks daily. Divorced. Education: [employer].      Drinks 4 cups of caffeine daily.    Family History  Problem Relation Age of Onset  . Cancer Mother     vaginal cancer, also HTN  . Prostate cancer Father     also HTN, dementia  . Diabetes Brother     Past Medical History  Diagnosis Date  . Cancer ([place])   . GERD (gastroesophageal reflux disease)   . Hyperlipidemia   . Hypertension   . Allergy   . Cataract   . Stroke [facility])     Past Surgical History  Procedure Laterality Date  . Tonsillectomy  1959  . Back surgery  2007  . Appendectomy  1963  . [place]  . Breast surgery    . Spine surgery    . Eye surgery  1999    bilateral cataract extraction  . Dilation and curettage of uterus      Current Outpatient Prescriptions  Medication Sig Dispense Refill  . Esomeprazole Magnesium (NEXIUM PO) Take by mouth.    . FIBER SELECT  [place] by mouth.    Marland Kitchen imipramine  (TOFRANIL) 25 MG tablet TAKE 3 TABLETS (75 MG TOTAL) BY MOUTH AT BEDTIME. 90 tablet 1  . simvastatin (ZOCOR) 40 MG tablet TAKE 1 TABLET (40 MG TOTAL) BY MOUTH DAILY AT BEDTIME 90 tablet 0  . valsartan (DIOVAN) 80 MG tablet Take 1 tablet (80 mg total) by mouth daily. 30 tablet 11  . modafinil (PROVIGIL) 100 MG tablet Take 1 tablet (100 mg total) by mouth daily. (Patient not taking: Reported on 11/16/2015) 30 tablet 2   No current facility-administered medications for this visit.    Allergies as of 11/16/2015 - Review Complete 11/16/2015  Allergen Reaction Noted  . Penicillins  06/21/2011  . Sulfur  06/21/2011    Vitals: BP 138/86 mmHg  Pulse 92  Resp 20  Ht 5' 2.5" (1.588 m)  Wt 170 lb (77.111 kg)  BMI 30.58 kg/m2 Last Weight:  Wt Readings from Last 1 Encounters:  11/16/15 170 lb (77.111 kg)   [id number] mass index is 30.58 kg/(m^2).     Last Height:   Ht Readings from Last 1 Encounters:  11/16/15 5' 2.5" (1.588 m)    Physical exam:  General: The patient is awake, alert and appears not in acute distress. The patient is well groomed. Head: Normocephalic, atraumatic. Neck is supple. Mallampati 3  neck circumference: 15.5 . Nasal airflow unrestricted, TMJ is not evident . Retrognathia is not seen.  Cardiovascular:  Regular rate and rhythm , without  murmurs or carotid bruit, and without distended neck veins. Respiratory: Lungs are clear to auscultation. Skin:  Without evidence of edema, or rash Trunk: BMI is 30. The patient's posture is erect.  Neurologic exam : The patient is awake and alert, oriented to place and time.   Memory subjective described as intact, she reports some delayed word finding. Attention span & concentration ability appears normal.  Speech is fluent without dysarthria, dysphonia or aphasia.  Mood and affect are appropriate.  Cranial nerves: Pupils are equal and briskly reactive to light. Funduscopic exam without  evidence of pallor or edema.  Extraocular  movements  in vertical and horizontal planes intact and without nystagmus. Visual fields by finger perimetry are intact. Hearing to finger rub intact. Facial sensation intact to fine touch.Facial motor strength is symmetric and tongue and uvula move midline. Shoulder shrug was symmetrical.  Motor exam:  Normal tone, muscle bulk and symmetric strength in all extremities. Deep tendon reflexes: in the  upper and lower extremities are symmetric and intact. Babinski maneuver response is downgoing.  The patient was advised of the nature of the diagnosed sleep disorder , the treatment options and risks for general a health and wellness arising from not treating the condition.  I spent more than 25 minutes of face to face time with the patient.  Greater than 50% of time was spent in counseling and coordination of care. We have discussed the diagnosis and differential and I answered the patient's questions.  Imipramine was suggested to be d/c but the patient wants to keep it on, sleeping sound and waking less stiff in AM.    Assessment:  After physical and neurologic examination, review of  Imaging studies ,  Results of polysomnography/ neurophysiology testing and pre-existing records as far as provided in visit., my assessment is   1) snoring and hypersomnia. Vivid dreams- but poor sleep habits.! Improved drastically after her overall mild sleep apnea was treated with CPAP, she could discontinue the  PLm medication and  desn't need Modafinil now. She was 90% compliant with CPAP, now using a FFM and has not longer uncontrolled HTN, her PLMs resolved. No further narcolepsy work up necessary.   Plan:  Treatment plan and additional workup :  continue auto CPAP 5-15 cm water.  Off ASA 81 mg per Dr Leonie Man. D/C imipramine- but patient reports it helps her sleep and she is less stiff and achy in AM - I will leave it on !   D/c Modafinil .  RV in one year with auto PAP review.   Asencion Partridge Kazim Corrales  MD  11/16/2015   CC: [name], [place] [address] [place], [address] [address]

## 2015-11-29 DIAGNOSIS — G4733 Obstructive sleep apnea (adult) (pediatric): Secondary | ICD-10-CM | POA: Diagnosis not present

## 2015-12-03 DIAGNOSIS — H02832 Dermatochalasis of right lower eyelid: Secondary | ICD-10-CM | POA: Diagnosis not present

## 2015-12-03 DIAGNOSIS — H11821 Conjunctivochalasis, right eye: Secondary | ICD-10-CM | POA: Diagnosis not present

## 2015-12-03 DIAGNOSIS — H5213 Myopia, bilateral: Secondary | ICD-10-CM | POA: Diagnosis not present

## 2015-12-03 DIAGNOSIS — H02831 Dermatochalasis of right upper eyelid: Secondary | ICD-10-CM | POA: Diagnosis not present

## 2015-12-14 ENCOUNTER — Ambulatory Visit: Payer: Medicare Other | Admitting: Emergency Medicine

## 2015-12-16 ENCOUNTER — Ambulatory Visit (INDEPENDENT_AMBULATORY_CARE_PROVIDER_SITE_OTHER): Payer: Medicare Other | Admitting: Emergency Medicine

## 2015-12-16 ENCOUNTER — Encounter: Payer: Self-pay | Admitting: Emergency Medicine

## 2015-12-16 VITALS — BP 137/73 | HR 72 | Temp 98.2°F | Resp 16 | Ht 63.0 in | Wt 170.0 lb

## 2015-12-16 DIAGNOSIS — I10 Essential (primary) hypertension: Secondary | ICD-10-CM

## 2015-12-16 DIAGNOSIS — R739 Hyperglycemia, unspecified: Secondary | ICD-10-CM

## 2015-12-16 LAB — POCT GLYCOSYLATED HEMOGLOBIN (HGB A1C): Hemoglobin A1C: 5.9

## 2015-12-16 MED ORDER — SIMVASTATIN 40 MG PO TABS: ORAL_TABLET | ORAL | Status: AC

## 2015-12-16 NOTE — Progress Notes (Addendum)
By signing my name below, I, Rawaa Al Rifaie, attest that this documentation has been prepared under the direction and in the presence of [name], MD.  Leandra Kern, Medical Scribe. 12/16/2015.  12:34 PM.  Chief Complaint:  Chief Complaint  Patient presents with  . Medication Refill    HPI: Tracey Richard is a 75 y.o. female who reports to [facility] today for a follow up.   Pt reports that she has been doing well. She indicates that she still exercises regularly. She states that her blood sugar is controlled, and has been running in the 90's range.   She states that her stroke could possibly be due to fluctuations in her blood pressure, however she indicates that she never really had problems with that.   She is compliant with taking her cholesterol medications, in addition to her blood pressure medications.   Pt notes that she had the regularly PNA vaccine when she possibly turned 21. She is UTD with the shingles vaccine.    Past Medical History  Diagnosis Date  . Cancer ([place])   . GERD (gastroesophageal reflux disease)   . Hyperlipidemia   . Hypertension   . Allergy   . Cataract   . Stroke [facility])    Past Surgical History  Procedure Laterality Date  . Tonsillectomy  1959  . Back surgery  2007  . Appendectomy  1963  . [place]  . Breast surgery    . Spine surgery    . Eye surgery  1999    bilateral cataract extraction  . Dilation and curettage of uterus     Social History   Social History  . Marital Status: Single    Spouse Name: N/A  . Number of Children: N/A  . Years of Education: N/A   Occupational History  . retired    Social History Main Topics  . Smoking status: Never Smoker   . Smokeless tobacco: Never Used  . Alcohol Use: 0.6 oz/week    0 Standard drinks or equivalent, 1 Glasses of wine per week     Comment: 1 every 6 mts   . Drug Use: No  . Sexual Activity: No   Other Topics Concern  . None   Social History Narrative   Walks  daily. Divorced. Education: [employer].      Drinks 4 cups of caffeine daily.   Family History  Problem Relation Age of Onset  . Cancer Mother     vaginal cancer, also HTN  . Prostate cancer Father     also HTN, dementia  . Diabetes Brother    Allergies  Allergen Reactions  . Penicillins   . Sulfur    Prior to Admission medications   Medication Sig Start Date End Date Taking? Authorizing Provider  Esomeprazole Magnesium (NEXIUM PO) Take by mouth.   Yes Historical Provider, MD  [place] by mouth.   Yes Historical Provider, MD  imipramine (TOFRANIL) 25 MG tablet TAKE 3 TABLETS (75 MG TOTAL) BY MOUTH AT BEDTIME. 11/16/15  Yes Carmen Dohmeier, MD  simvastatin (ZOCOR) 40 MG tablet TAKE 1 TABLET (40 MG TOTAL) BY MOUTH DAILY AT BEDTIME 10/06/15  Yes [name], MD  valsartan (DIOVAN) 80 MG tablet Take 1 tablet (80 mg total) by mouth daily. 05/18/15  Yes [name], MD     ROS: The patient denies fevers, chills, night sweats, unintentional weight loss, chest pain, palpitations, wheezing, dyspnea on exertion, nausea,  vomiting, abdominal pain, dysuria, hematuria, melena, numbness, weakness, or tingling.   All other systems have been reviewed and were otherwise negative with the exception of those mentioned in the HPI and as above.    PHYSICAL EXAM: Filed Vitals:   12/16/15 1215  BP: 137/73  Pulse: 72  Temp: 98.2 F (36.8 C)  Resp: 16   Body mass index is 30.12 kg/(m^2).   General: Alert, no acute distress HEENT:  Normocephalic, atraumatic, oropharynx patent. Eye: [name] Cardiovascular:  Regular rate and rhythm, no rubs murmurs or gallops.  No Carotid bruits, radial pulse intact. No pedal edema.  Respiratory: Clear to auscultation bilaterally.  No wheezes, rales, or rhonchi.  No cyanosis, no use of accessory musculature Abdominal: No organomegaly, abdomen is soft and non-tender, positive bowel sounds.  No masses. Musculoskeletal: Gait intact. No  edema, tenderness Skin: No rashes. Neurologic: Facial musculature symmetric. Psychiatric: Patient acts appropriately throughout our interaction. Lymphatic: No cervical or submandibular lymphadenopathy  LABS:  Hemoglobin A1c 5.9  EKG/XRAY:   Primary read interpreted by Dr. Everlene Farrier at [facility].   ASSESSMENT/PLAN: Patient looks great. She has had no further stroke like symptoms. Her sugars have been under 120. Will check A1c. I personally performed the services described in this documentation, which was scribed in my presence. The recorded information has been reviewed and is accurate.  Gross sideeffects, risk and benefits, and alternatives of medications d/w patient. Patient is aware that all medications have potential sideeffects and we are unable to predict every sideeffect or drug-drug interaction that may occur.  [name] MD 12/16/2015 12:27 PM

## 2015-12-23 ENCOUNTER — Ambulatory Visit (INDEPENDENT_AMBULATORY_CARE_PROVIDER_SITE_OTHER): Payer: Medicare Other | Admitting: Neurology

## 2015-12-23 ENCOUNTER — Ambulatory Visit: Payer: Medicare Other | Admitting: Neurology

## 2015-12-23 ENCOUNTER — Encounter: Payer: Self-pay | Admitting: Neurology

## 2015-12-23 VITALS — BP 117/69 | HR 72 | Ht 63.0 in | Wt 170.0 lb

## 2015-12-23 DIAGNOSIS — I671 Cerebral aneurysm, nonruptured: Secondary | ICD-10-CM

## 2015-12-23 NOTE — Patient Instructions (Addendum)
I had a long d/w patient and her husband about her brain hemorrhage, risk for recurrent stroke/TIAs, aneurysm rupture, personally independently reviewed imaging studies and stroke evaluation results and answered questions.Start   aspirin 81 mg daily  for secondary stroke prevention and maintain strict control of hypertension with blood pressure goal below 130/90, diabetes with hemoglobin A1c goal below 6.5% and lipids with LDL cholesterol goal below 70 mg/dL. I also advised the patient to eat a healthy diet with plenty of whole grains, cereals, fruits and vegetables, exercise regularly and maintain ideal body weight. Continue conservative follow-up for left MCA aneurysm with one yearly MRA studies Followup in the future with stroke nurse practitioner or call earlier if necessary

## 2015-12-23 NOTE — Progress Notes (Signed)
[facility] [address] [place]. [place] [address] [phone]       OFFICE FOLLOW-UP NOTE  Tracey. Tracey Richard Date of Birth:  October 15, 1941 Medical Record Number:  [id number]   HPI: Tracey Richard is a 75 year old Caucasian lady seen today for first office follow-up visit following admission for intracerebral hemorrhage in June 2016.Tracey Richard is a 75 y.o. female who reports that yesterday she awakened normal. Went for a walk with her friend and noted that she was Walking as if drunk. She was going to the right. She was taken back home and went to bed early which is unusual for her. Awakened this afternoon and her gait was much better but was noted to drop the mail from her right hand. Also noted when she went out to dinner that she was dropping her fork. Presented for evaluation at that time. Patient is right handed.  Date last known well: Date: 04/19/2015 Time last known well: Time: 16:30 tPA Given: No: ICH.Marland Kitchen She was admitted to the intensive care unit where she underwent close neurological monitoring as well as tight blood pressure control. Her blood pressure was not particularly elevated and was he controlled. She was cleared by speech therapy and started on no medications. MRI scan was obtained later on the day of admission and confirmed left basal ganglia hemorrhage without any underlying lesion. There is no significant mass effect, midline shift or intraventricular extension. Patient underwent echocardiogram, carotid Doppler both of which were normal. She had only minimal weakness of the right hand muscles which was improving at the time of discharge. She was seen by physical occupational speech therapy and found to have no therapy needs and was felt stable to be discharged home . She has done well and has not had any recurrent neurological symptoms. She states her blood pressure is well controlled. She in fact traveled to [place] to attend her  granddaughter's wedding. She is independent in all activities of daily living. She is very active. She admits to mild short-term memory difficulties which she states these existed even prior to her hemorrhage and are not any worse now. 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 Update 12/23/2015 : She returns for follow-up after last visit 6 months ago. She continues to do well without recurrent stroke or TIA symptoms. She's had only one episode of minor headache which she rated as 1 out of 10 and did not even need medication. She had a follow-up MRI scan of the brain on 07/11/15 which I personally reviewed and shows satisfactory resolution of the basal ganglia hemorrhage. 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 Patient is doing extremely well with no physical deficits from a brain hemorrhage. She's noticed some occasional short-term memory difficulties but these are not progressive or bothersome. She states her blood pressure is well controlled and today it is 117/99. She's tolerating Zocor well without muscle aches and pains. She has no complaints today. ROS:   14 system review of systems is positive for   no complaints today and all  systems negative  PMH:  Past Medical History  Diagnosis Date  . Cancer ([place])   . GERD (gastroesophageal reflux disease)   . Hyperlipidemia   . Hypertension   . Allergy   . Cataract   . Stroke [facility])     Social History:  Social History   Social History  .  Marital Status: Single    Spouse Name: N/A  . Number of Children: N/A  . Years of Education: N/A   Occupational History  . retired    Social History Main Topics  . Smoking status: Never Smoker   . Smokeless tobacco: Never Used  . Alcohol Use: 0.6 oz/week    0 Standard drinks or equivalent, 1 Glasses of wine per week     Comment: 1 every 6 mts   . Drug Use: No  . Sexual Activity: No    Other Topics Concern  . Not on file   Social History Narrative   Walks daily. Divorced. Education: [employer].      Drinks 4 cups of caffeine daily.    Medications:   Current Outpatient Prescriptions on File Prior to Visit  Medication Sig Dispense Refill  . Esomeprazole Magnesium (NEXIUM PO) Take by mouth.    . FIBER SELECT [place] by mouth.    Marland Kitchen imipramine (TOFRANIL) 25 MG tablet TAKE 3 TABLETS (75 MG TOTAL) BY MOUTH AT BEDTIME. 270 tablet 3  . simvastatin (ZOCOR) 40 MG tablet TAKE 1 TABLET (40 MG TOTAL) BY MOUTH DAILY AT BEDTIME 90 tablet 3  . valsartan (DIOVAN) 80 MG tablet Take 1 tablet (80 mg total) by mouth daily. 30 tablet 11   No current facility-administered medications on file prior to visit.    Allergies:   Allergies  Allergen Reactions  . Penicillins   . Sulfur     Physical Exam General: well developed, well nourished, seated, in no evident distress Head: head normocephalic and atraumatic.  Neck: supple with no carotid or supraclavicular bruits Cardiovascular: regular rate and rhythm, no murmurs Musculoskeletal: no deformity Skin:  no rash/petichiae Vascular:  Normal pulses all extremities Filed Vitals:   12/23/15 1411  BP: 117/69  Pulse: 72   Neurologic Exam Mental Status: Awake and fully alert. Oriented to place and time. Recent and remote memory intact. Attention span, concentration and fund of knowledge appropriate. Mood and affect appropriate.  Cranial Nerves: Fundoscopic exam not done Pupils equal, briskly reactive to light. Extraocular movements full without nystagmus. Visual fields full to confrontation. Hearing intact. Facial sensation intact. Face, tongue, palate moves normally and symmetrically.  Motor: Normal bulk and tone. Normal strength in all tested extremity muscles. Sensory.: intact to touch ,pinprick .position and vibratory sensation.  Coordination: Rapid alternating movements normal in all extremities. Finger-to-nose and  heel-to-shin performed accurately bilaterally. Gait and Station: Arises from chair without difficulty. Stance is normal. Gait demonstrates normal stride length and balance . Unable to heel, toe and tandem walk without difficulty.  Reflexes: 1+ and symmetric. Toes downgoing.       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 She is doing extremely well    PLAN: I had a long d/w patient and her husband about her brain hemorrhage, risk for recurrent stroke/TIAs, aneurysm rupture, personally independently reviewed imaging studies and stroke evaluation results and answered questions.Start   aspirin 81 mg daily  for secondary stroke prevention and maintain strict control of hypertension with blood pressure goal below 130/90, diabetes with hemoglobin A1c goal below 6.5% and lipids with LDL cholesterol goal below 70 mg/dL. I also advised the patient to eat a healthy diet with plenty of whole grains, cereals, fruits and vegetables, exercise regularly and maintain ideal body weight. Continue conservative follow-up for left MCA aneurysm with  one yearly MRA studiesGreater than 50% of time during this 25 minute visit was spent on counseling, discussion with patient and family and coordination of care. Follow-up with Dr. Roddie Mc for sleep apnea. Followup in the future with stroke nurse practitioner or call earlier if necessary    Tracey Contras, MD Note: This document was prepared with digital dictation and possible smart phrase technology. Any transcriptional errors that result from this process are unintentional

## 2015-12-27 DIAGNOSIS — G4733 Obstructive sleep apnea (adult) (pediatric): Secondary | ICD-10-CM | POA: Diagnosis not present

## 2016-01-27 DIAGNOSIS — G4733 Obstructive sleep apnea (adult) (pediatric): Secondary | ICD-10-CM | POA: Diagnosis not present

## 2016-02-26 DIAGNOSIS — G4733 Obstructive sleep apnea (adult) (pediatric): Secondary | ICD-10-CM | POA: Diagnosis not present

## 2016-03-28 DIAGNOSIS — G4733 Obstructive sleep apnea (adult) (pediatric): Secondary | ICD-10-CM | POA: Diagnosis not present

## 2016-03-31 ENCOUNTER — Other Ambulatory Visit: Payer: Self-pay | Admitting: Emergency Medicine

## 2016-04-27 DIAGNOSIS — G4733 Obstructive sleep apnea (adult) (pediatric): Secondary | ICD-10-CM | POA: Diagnosis not present

## 2016-05-01 DIAGNOSIS — G4733 Obstructive sleep apnea (adult) (pediatric): Secondary | ICD-10-CM | POA: Diagnosis not present

## 2016-05-28 DIAGNOSIS — G4733 Obstructive sleep apnea (adult) (pediatric): Secondary | ICD-10-CM | POA: Diagnosis not present

## 2016-06-27 ENCOUNTER — Encounter: Payer: Self-pay | Admitting: Nurse Practitioner

## 2016-06-27 ENCOUNTER — Ambulatory Visit (INDEPENDENT_AMBULATORY_CARE_PROVIDER_SITE_OTHER): Payer: Medicare Other | Admitting: Nurse Practitioner

## 2016-06-27 VITALS — BP 131/76 | HR 67 | Ht 63.0 in | Wt 173.8 lb

## 2016-06-27 DIAGNOSIS — E785 Hyperlipidemia, unspecified: Secondary | ICD-10-CM

## 2016-06-27 DIAGNOSIS — I671 Cerebral aneurysm, nonruptured: Secondary | ICD-10-CM

## 2016-06-27 DIAGNOSIS — I10 Essential (primary) hypertension: Secondary | ICD-10-CM | POA: Diagnosis not present

## 2016-06-27 NOTE — Patient Instructions (Signed)
Continue  aspirin 81 mg daily  for secondary stroke prevention  Maintain strict control of hypertension with blood pressure goal below 130/90, today's reading  131/76  diabetes with hemoglobin A1c goal below 6.5%  Lipids with LDL cholesterol goal below 70 mg/dL. continue Zocor  Eat a healthy diet with plenty of whole grains, cereals, fruits and vegetables, exercise regularly , continue to walk and maintain ideal body weight.  Continue conservative follow-up for left MCA aneurysm with once yearly MRA Follow-up in 6 months, will repeat MRA at that time

## 2016-06-27 NOTE — Progress Notes (Signed)
[facility]  PATIENT: Tracey Richard DOB: December 23, 1940   REASON FOR VISIT: Follow-up for history of intracerebral hemorrhage  HISTORY FROM: Patient  HISTORY OF PRESENT ILLNESS: HISTORY PSMs Tracey Richard is a 75 year old Caucasian lady seen today for first office follow-up visit following admission for intracerebral hemorrhage in June 2016.Tracey Richard is a 75 y.o. female who reports that yesterday she awakened normal. Went for a walk with her friend and noted that she was Walking as if drunk. She was going to the right. She was taken back home and went to bed early which is unusual for her. Awakened this afternoon and her gait was much better but was noted to drop the mail from her right hand. Also noted when she went out to dinner that she was dropping her fork. Presented for evaluation at that time. Patient is right handed.  Date last known well: Date: 04/19/2015 Time last known well: Time: 16:30 tPA Given: No: ICH.Marland Kitchen She was admitted to the intensive care unit where she underwent close neurological monitoring as well as tight blood pressure control. Her blood pressure was not particularly elevated and was he controlled. She was cleared by speech therapy and started on no medications. MRI scan was obtained later on the day of admission and confirmed left basal ganglia hemorrhage without any underlying lesion. There is no significant mass effect, midline shift or intraventricular extension. Patient underwent echocardiogram, carotid Doppler both of which were normal. She had only minimal weakness of the right hand muscles which was improving at the time of discharge. She was seen by physical occupational speech therapy and found to have no therapy needs and was felt stable to be discharged home . She has done well and has not had any recurrent neurological symptoms. She states her blood pressure is well controlled. She in fact traveled to [place] to attend her  granddaughter's wedding. She is independent in all activities of daily living. She is very active. She admits to mild short-term memory difficulties which she states these existed even prior to her hemorrhage and are not any worse now. 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 Update 12/23/2015 PS: She returns for follow-up after last visit 6 months ago. She continues to do well without recurrent stroke or TIA symptoms. She's had only one episode of minor headache which she rated as 1 out of 10 and did not even need medication. She had a follow-up MRI scan of the brain on 07/11/15 which I personally reviewed and shows satisfactory resolution of the basal ganglia hemorrhage. 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 Patient is doing extremely well with no physical deficits from a brain hemorrhage. She's noticed some occasional short-term memory difficulties but these are not progressive or bothersome. She states her blood pressure is well controlled and today it is 117/99. She's tolerating Zocor well without muscle aches and pains. She has no complaints today. UPDATE 09/05/2017CM Ms. Allnutt, 75 year old female returns for follow-up. She has not had further stroke or TIA symptoms. Follow-up MRI scan of the brain on 07/11/2015 shows satisfactory resolution of the basal ganglia hemorrhage. 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 She is currently on aspirin without further stroke or TIA symptoms. She has minimal bruising. Blood pressure is well controlled. She is continuing to exercise by walking several miles a day. She denies any aches or  pains from her Zocor. She returns for follow-up REVIEW OF SYSTEMS: Full 14 system review of systems performed and notable only for those listed, all others are neg:  Constitutional: neg  Cardiovascular: neg Ear/Nose/Throat: neg  Skin:  neg Eyes: neg Respiratory: neg Gastroitestinal: neg  Hematology/Lymphatic: neg  Endocrine: neg Musculoskeletal:neg Allergy/Immunology: neg Neurological: neg Psychiatric: neg Sleep : neg   ALLERGIES: Allergies  Allergen Reactions  . Penicillins   . Sulfur     HOME MEDICATIONS: Outpatient Medications Prior to Visit  Medication Sig Dispense Refill  . Esomeprazole Magnesium (NEXIUM PO) Take 20 mg by mouth daily.     Marland Kitchen [place] by mouth.    Marland Kitchen imipramine (TOFRANIL) 25 MG tablet TAKE 3 TABLETS (75 MG TOTAL) BY MOUTH AT BEDTIME. 270 tablet 3  . simvastatin (ZOCOR) 40 MG tablet TAKE 1 TABLET (40 MG TOTAL) BY MOUTH DAILY AT BEDTIME 90 tablet 3  . valsartan (DIOVAN) 80 MG tablet TAKE 1 TABLET (80 MG TOTAL) BY MOUTH DAILY. 90 tablet 0  . acetaminophen (TYLENOL) 325 MG tablet Take 325 mg by mouth every 6 (six) hours as needed.     No facility-administered medications prior to visit.     PAST MEDICAL HISTORY: Past Medical History:  Diagnosis Date  . Allergy   . Cancer ([place])   . Cataract   . GERD (gastroesophageal reflux disease)   . Hyperlipidemia   . Hypertension   . Stroke [facility])     PAST SURGICAL HISTORY: Past Surgical History:  Procedure Laterality Date  . APPENDECTOMY  1963  . BACK SURGERY  2007  . BREAST SURGERY    . DILATION AND CURETTAGE OF UTERUS    . EYE SURGERY  1999   bilateral cataract extraction  . moles  1965  . SPINE SURGERY    . TONSILLECTOMY  1959    FAMILY HISTORY: Family History  Problem Relation Age of Onset  . Cancer Mother     vaginal cancer, also HTN  . Prostate cancer Father     also HTN, dementia  . Diabetes Brother     SOCIAL HISTORY: Social History   Social History  . Marital status: Single    Spouse name: N/A  . Number of children: N/A  . Years of education: N/A   Occupational History  . retired    Social History Main Topics  . Smoking status: Never Smoker  . Smokeless tobacco: Never Used  .  Alcohol use 0.6 oz/week    1 Glasses of wine per week     Comment: 1 every 6 mts   . Drug use: No  . Sexual activity: No   Other Topics Concern  . Not on file   Social History Narrative   Walks daily. Divorced. Education: [employer].      Drinks 4 cups of caffeine daily.     PHYSICAL EXAM  Vitals:   06/27/16 1134  BP: 131/76  Pulse: 67  Weight: 173 lb 12.8 oz (78.8 kg)  Height: 5\' 3"  (1.6 m)   Body mass index is 30.79 kg/m. General: well developed, well nourished, seated, in no evident distress Head: head normocephalic and atraumatic.  Neck: supple with no carotid  bruits Cardiovascular: regular rate and rhythm, no murmurs Musculoskeletal: no deformity Skin:  no rash/petichiae Vascular:  Normal pulses all extremities   Neurological examination  Mental Status: Awake and fully alert. Oriented to place and time. Recent and remote memory intact. Attention span, concentration and fund of  knowledge appropriate. Mood and affect appropriate.  Cranial Nerves: Pupils equal, briskly reactive to light. Extraocular movements full without nystagmus. Visual fields full to confrontation. Hearing intact. Facial sensation intact. Face, tongue, palate moves normally and symmetrically.  Motor: Normal bulk and tone. Normal strength in all tested extremity muscles. Sensory.: intact to touch ,pinprick .position and vibratory sensation.  Coordination: Rapid alternating movements normal in all extremities. Finger-to-nose and heel-to-shin performed accurately bilaterally. Gait and Station: Arises from chair without difficulty. Stance is normal. Gait demonstrates normal stride length and balance . Able to heel, toe and tandem walk without difficulty.  Reflexes: 1+ and symmetric. Toes downgoing.       DIAGNOSTIC DATA (LABS, IMAGING, TESTING) - I reviewed patient records, labs, notes, testing and imaging myself where available.  Lab Results  Component Value Date   WBC 8.0 04/21/2015   HGB  12.0 04/21/2015   HCT 37.8 04/21/2015   MCV 87.5 04/21/2015   PLT 248 04/21/2015      Component Value Date/Time   NA 139 08/10/2015 1119   K 4.0 08/10/2015 1119   CL 105 08/10/2015 1119   CO2 28 08/10/2015 1119   GLUCOSE 103 (H) 08/10/2015 1119   BUN 12 08/10/2015 1119   CREATININE 0.90 08/10/2015 1119   CALCIUM 9.0 08/10/2015 1119   PROT 6.4 01/12/2015 1034   ALBUMIN 4.1 01/12/2015 1034   AST 32 01/12/2015 1034   ALT 39 (H) 01/12/2015 1034   ALKPHOS 49 01/12/2015 1034   BILITOT 0.4 01/12/2015 1034   GFRNONAA 64 08/10/2015 1119   GFRAA 74 08/10/2015 1119   Lab Results  Component Value Date   CHOL 167 08/10/2015   HDL 46 08/10/2015   LDLCALC 100 08/10/2015   TRIG 104 08/10/2015   CHOLHDL 3.6 08/10/2015   Lab Results  Component Value Date   HGBA1C 5.9 12/16/2015    ASSESSMENT AND PLAN 70 year Caucasian lady with small left basal ganglia hemorrhage likely due to hypertension in June 2016 with a 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 She is doing extremely well The patient is a current patient of Dr. Leonie Man  who is out of the office today . This note is sent to the work in doctor.     Continue  aspirin 81 mg daily  for secondary stroke prevention  Maintain strict control of hypertension with blood pressure goal below 130/90, today's reading  131/76  diabetes with hemoglobin A1c goal below 6.5%  Lipids with LDL cholesterol goal below 70 mg/dL. continue Zocor  Eat a healthy diet with plenty of whole grains, cereals, fruits and vegetables, exercise regularly , continue to walk and maintain ideal body weight.  Continue conservative follow-up for left MCA aneurysm with once yearly MRA Follow-up in 6 months, will repeat MRA at that time Dennie Bible, [facility], [facility], [facility] [address] lipids are [address], [place], [place] [address] [phone] are

## 2016-06-28 ENCOUNTER — Other Ambulatory Visit: Payer: Self-pay | Admitting: Emergency Medicine

## 2016-06-28 DIAGNOSIS — G4733 Obstructive sleep apnea (adult) (pediatric): Secondary | ICD-10-CM | POA: Diagnosis not present

## 2016-07-01 IMAGING — CR DG CHEST 2V
2 series · 2 of 2 positions shown · non-contrast
Comparison: Prior chest x-ray and rib series [DATE]

CLINICAL DATA: 73-year-old female with 3 day history of cough

EXAM:
CHEST  2 VIEW

[PA]
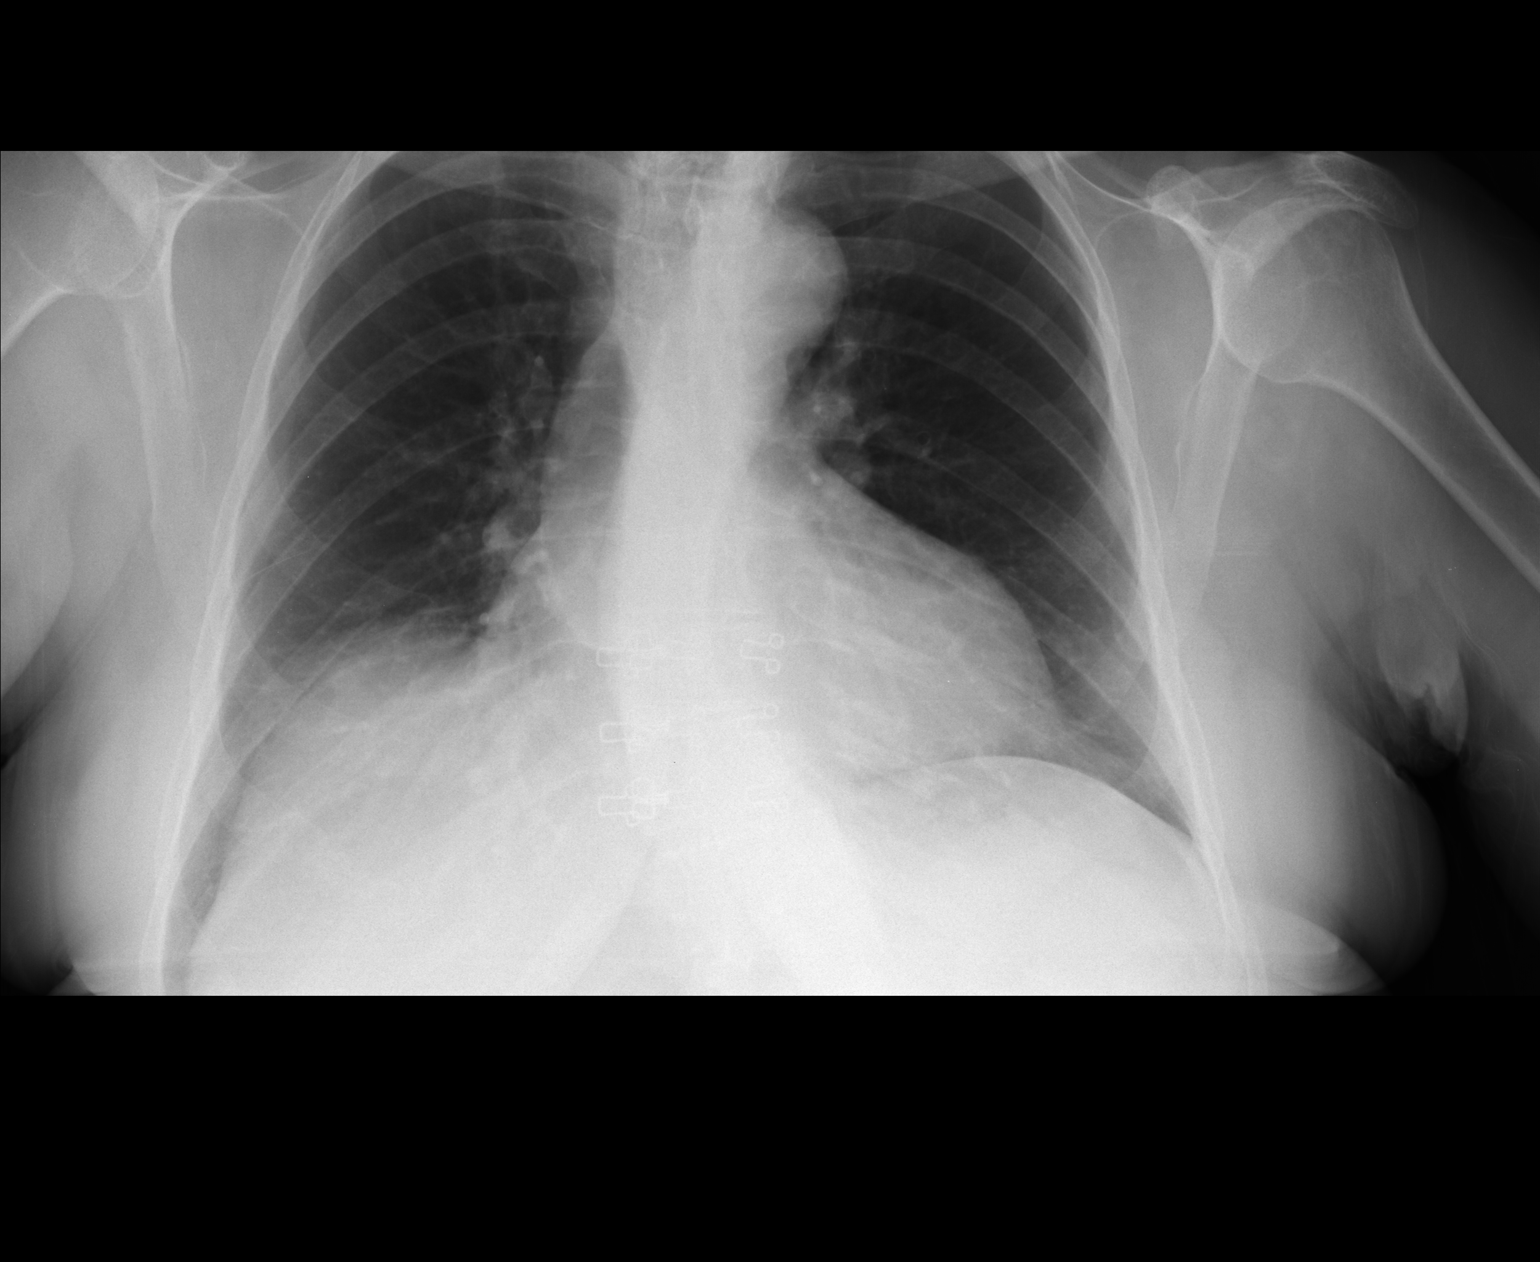

[lateral]
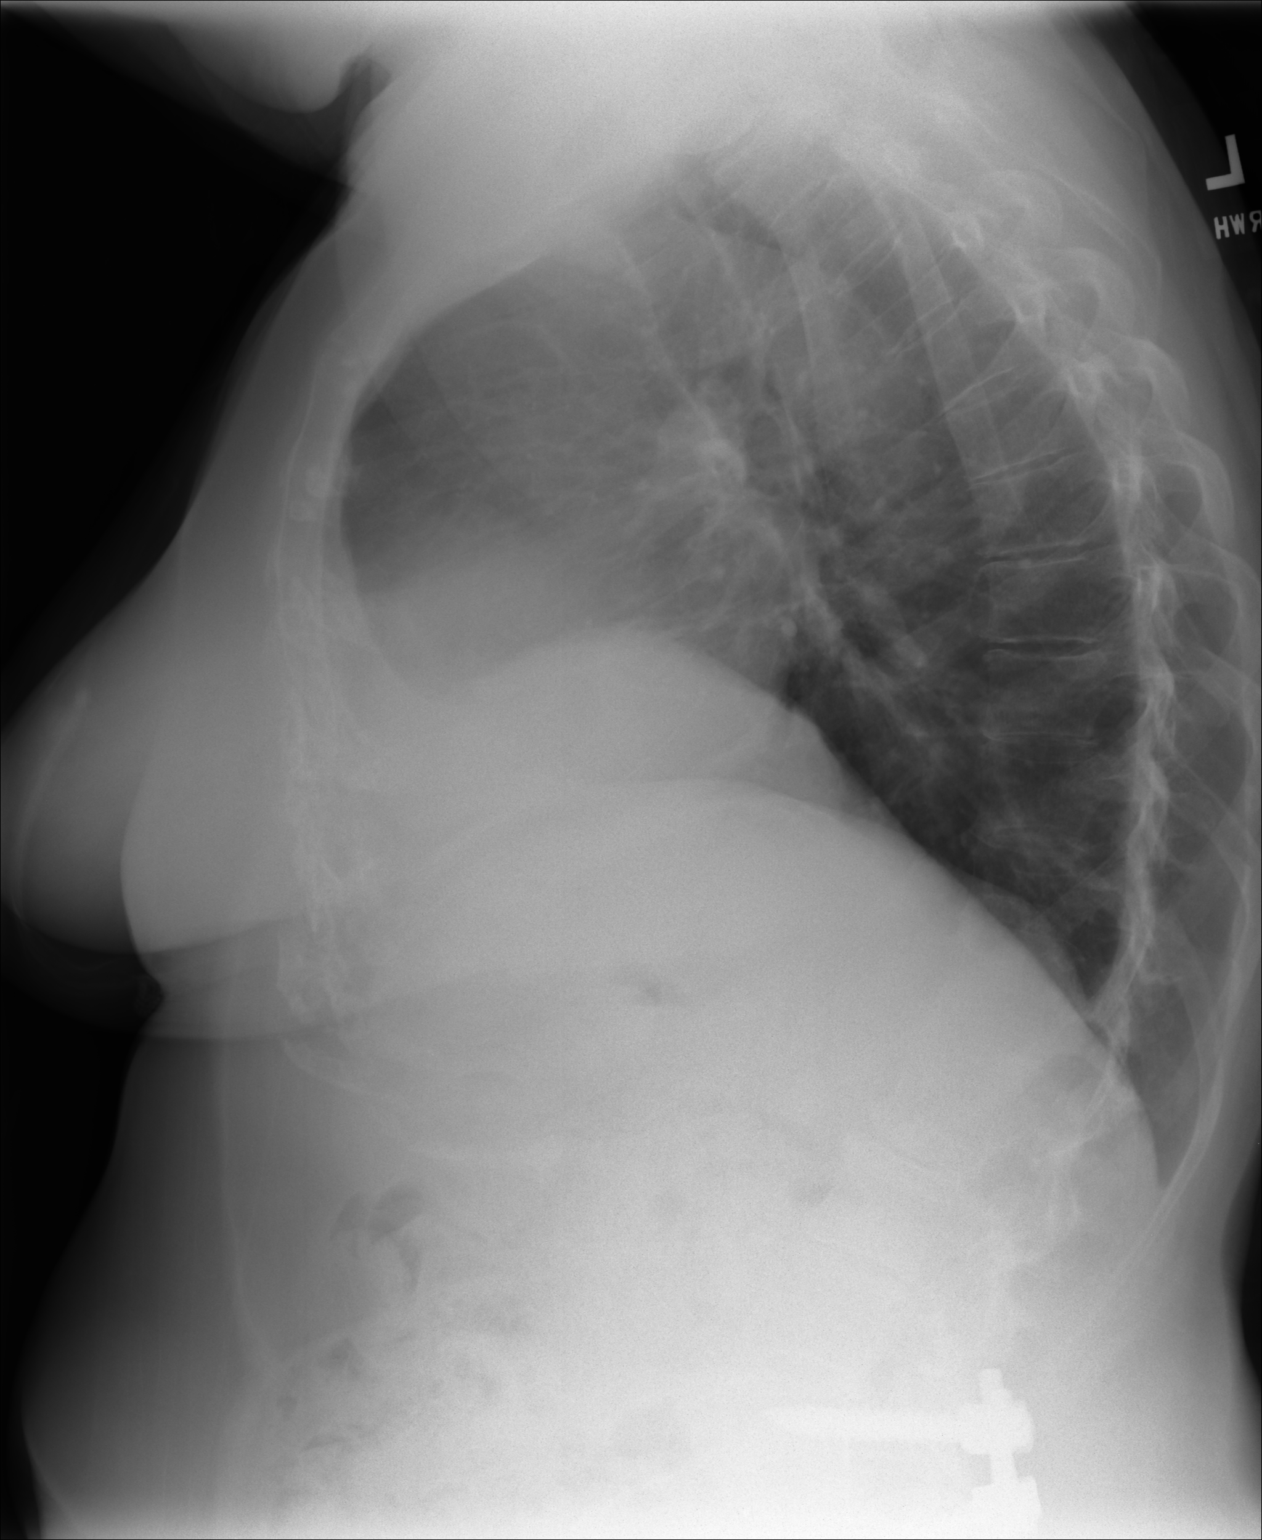

[2 of 2 positions shown; findings below may reference images not displayed]

FINDINGS: Is Stable cardiac and mediastinal contours. The aorta is tortuous.
Persistent elevation of the right hemidiaphragm with mild right
middle lobe atelectasis. No focal airspace consolidation, pleural
effusion, pulmonary edema or pneumothorax. No acute osseous
abnormality. Incompletely imaged lower lumbar stabilization
hardware. No acute osseous abnormality.
IMPRESSION: 1. Stable chest x-ray without evidence of acute cardiopulmonary
process.
2. Stable elevation of the right hemidiaphragm with associated right
middle lobe atelectasis.

## 2016-07-03 ENCOUNTER — Ambulatory Visit (INDEPENDENT_AMBULATORY_CARE_PROVIDER_SITE_OTHER): Payer: Medicare Other | Admitting: Emergency Medicine

## 2016-07-03 VITALS — BP 116/70 | HR 85 | Temp 98.1°F | Resp 18 | Ht 63.0 in | Wt 170.0 lb

## 2016-07-03 DIAGNOSIS — I10 Essential (primary) hypertension: Secondary | ICD-10-CM

## 2016-07-03 DIAGNOSIS — R739 Hyperglycemia, unspecified: Secondary | ICD-10-CM

## 2016-07-03 DIAGNOSIS — Z23 Encounter for immunization: Secondary | ICD-10-CM | POA: Diagnosis not present

## 2016-07-03 DIAGNOSIS — E785 Hyperlipidemia, unspecified: Secondary | ICD-10-CM

## 2016-07-03 LAB — LIPID PANEL
CHOL/HDL RATIO: 2.7 ratio (ref <=5.0)
CHOLESTEROL: 155 mg/dL (ref 125–200)
HDL: 58 mg/dL (ref >=46)
LDL Cholesterol: 82 mg/dL (ref <130)
Triglycerides: 73 mg/dL (ref <150)
VLDL: 15 mg/dL (ref <30)

## 2016-07-03 LAB — POCT CBC
Granulocyte percent: 61.7 %G (ref 37–80)
HEMATOCRIT: 36.3 % — AB (ref 37.7–47.9)
HEMOGLOBIN: 12.6 g/dL (ref 12.2–16.2)
LYMPH, POC: 2.3 (ref 0.6–3.4)
MCH, POC: 29.5 pg (ref 27–31.2)
MCHC: 34.6 g/dL (ref 31.8–35.4)
MCV: 85.2 fL (ref 80–97)
MID (cbc): 0.8 (ref 0–0.9)
MPV: 7.2 fL (ref 0–99.8)
POC GRANULOCYTE: 4.9 (ref 2–6.9)
POC LYMPH %: 28.3 % (ref 10–50)
POC MID %: 10 %M (ref 0–12)
Platelet Count, POC: 236 10*3/uL (ref 142–424)
RBC: 4.27 M/uL (ref 4.04–5.48)
RDW, POC: 13.7 %
WBC: 8 10*3/uL (ref 4.6–10.2)

## 2016-07-03 LAB — COMPLETE METABOLIC PANEL WITH GFR
ALBUMIN: 3.6 g/dL (ref 3.6–5.1)
ALT: 13 U/L (ref 6–29)
AST: 16 U/L (ref 10–35)
Alkaline Phosphatase: 44 U/L (ref 33–130)
BUN: 18 mg/dL (ref 7–25)
CALCIUM: 9.5 mg/dL (ref 8.6–10.4)
CO2: 27 mmol/L (ref 20–31)
CREATININE: 0.98 mg/dL — AB (ref 0.60–0.93)
Chloride: 105 mmol/L (ref 98–110)
GFR, Est African American: 66 mL/min (ref >=60)
GFR, Est Non African American: 57 mL/min — ABNORMAL LOW (ref >=60)
GLUCOSE: 101 mg/dL — AB (ref 65–99)
POTASSIUM: 4.3 mmol/L (ref 3.5–5.3)
SODIUM: 141 mmol/L (ref 135–146)
Total Bilirubin: 0.4 mg/dL (ref 0.2–1.2)
Total Protein: 6.5 g/dL (ref 6.1–8.1)

## 2016-07-03 LAB — POCT GLYCOSYLATED HEMOGLOBIN (HGB A1C): Hemoglobin A1C: 5.9

## 2016-07-03 LAB — GLUCOSE, POCT (MANUAL RESULT ENTRY): POC GLUCOSE: 103 mg/dL — AB (ref 70–99)

## 2016-07-03 NOTE — Progress Notes (Signed)
Subjective:  This chart was scribed for [name] MD,  by Tamsen Roers, at [facility].  This patient was seen in room 1 and the patient's care was started at 8:14 AM.   Chief Complaint  Patient presents with  . Follow-up    Pt. wants blood work     Patient ID: Tracey Richard, female    DOB: Jun 14, 1941, 75 y.o.   MRN: [id number]  HPI  HPI Comments: Tracey Richard is a 75 y.o. female with a history of diabetes who presents to the [facility] for blood work.  Patient feels that her name recalling and memory skills have declined mildly since her aneurism.  She denies this effecting her daily life or driving skills.  Patient has found a PCP who she knows and will be followed by.    MR of head in 07/11/2015 showed 2.2 mm aneurism- unchanged.   BP left arm: 128/80  Patient would like a flu and tetanus shot today.    Patient Active Problem List   Diagnosis Date Noted  . Aneurysm, cerebral, nonruptured 12/23/2015  . Brain aneurysm 06/25/2015  . 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 Snoring 06/16/2015  . Vivid dream 06/16/2015  . 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 Nontraumatic intracranial hemorrhage ([place]) 06/16/2015  . ICH (intracerebral hemorrhage) ([place]) 04/22/2015  . Cytotoxic brain edema ([place])   . Intracranial hemorrhage ([place]) 04/21/2015  . Falls frequently 11/14/2013  . HTN (hypertension) 11/14/2013  . Dyslipidemia 11/14/2013  . Diabetes ([place]) 11/19/2012  . 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 Allergy   . Cancer ([place])   . Cataract   . GERD (gastroesophageal reflux disease)   . Hyperlipidemia   . Hypertension   . Stroke [facility])    Past Surgical History:  Procedure Laterality Date  . APPENDECTOMY  1963  . BACK SURGERY  2007  . BREAST SURGERY    . DILATION AND CURETTAGE OF UTERUS    . EYE SURGERY  1999   bilateral cataract extraction  . moles  1965  . SPINE SURGERY     . TONSILLECTOMY  1959   Allergies  Allergen Reactions  . Penicillins   . Sulfur    Prior to Admission medications   Medication Sig Start Date End Date Taking? Authorizing Provider  aspirin EC 81 MG tablet Take 81 mg by mouth daily.    Historical Provider, MD  Cinnamon 500 MG capsule Take 500 mg by mouth daily.    Historical Provider, MD  Esomeprazole Magnesium (NEXIUM PO) Take 20 mg by mouth daily.     Historical Provider, MD  [place] by mouth.    Historical Provider, MD  Nyoka Cowden Tea 315 MG CAPS Take 315 mg by mouth daily.    Historical Provider, MD  imipramine (TOFRANIL) 25 MG tablet TAKE 3 TABLETS (75 MG TOTAL) BY MOUTH AT BEDTIME. 11/16/15   Asencion Partridge Dohmeier, MD  OVER THE COUNTER MEDICATION Calcium, magnesium, zinc, plus D3  (one daily)    Historical Provider, MD  OVER THE COUNTER MEDICATION Fish oil 1200mg  -360mg  omega 3 (takes daily)    Historical Provider, MD  simvastatin (ZOCOR) 40 MG tablet TAKE 1 TABLET (40 MG TOTAL) BY MOUTH DAILY AT BEDTIME 12/16/15   [name], MD  valsartan (DIOVAN) 80 MG tablet TAKE 1 TABLET (80 MG TOTAL) BY MOUTH DAILY. 07/03/16   [name], MD  Social History   Social History  . Marital status: Single    Spouse name: N/A  . Number of children: N/A  . Years of education: N/A   Occupational History  . retired    Social History Main Topics  . Smoking status: Never Smoker  . Smokeless tobacco: Never Used  . Alcohol use 0.6 oz/week    1 Glasses of wine per week     Comment: 1 every 6 mts   . Drug use: No  . Sexual activity: No   Other Topics Concern  . Not on file   Social History Narrative   Walks daily. Divorced. Education: [employer].      Drinks 4 cups of caffeine daily.    Review of Systems  Constitutional: Negative for chills and fever.  Eyes: Negative for pain and redness.  Respiratory: Negative for cough and shortness of breath.   Gastrointestinal: Negative for nausea and vomiting.  Musculoskeletal:  Negative for neck pain and neck stiffness.       Objective:   Physical Exam Vitals:   07/03/16 0810  BP: 116/70  Pulse: 85  Resp: 18  Temp: 98.1 F (36.7 C)  TempSrc: Oral  SpO2: 95%  Weight: 170 lb (77.1 kg)  Height: 5\' 3"  (1.6 m)    CONSTITUTIONAL: Well developed/well nourished HEAD: Normocephalic/atraumatic EYES: EOMI/PERRL ENMT: Mucous membranes moist NECK: supple no meningeal signs SPINE/BACK:entire spine nontender CV: S1/S2 noted, no murmurs/rubs/gallops noted LUNGS: Lungs are clear to auscultation bilaterally, no apparent distress NEURO: Pt is awake/alert/appropriate, moves all extremitiesx4.  No facial droop.   EXTREMITIES: pulses normal/equal, full ROM SKIN: warm, color normal PSYCH: no abnormalities of mood noted, alert and oriented to situation  Results for orders placed or performed in visit on 07/03/16  POCT CBC  Result Value Ref Range   WBC 8.0 4.6 - 10.2 K/uL   Lymph, poc 2.3 0.6 - 3.4   POC LYMPH PERCENT 28.3 10 - 50 %L   MID (cbc) 0.8 0 - 0.9   POC MID % 10.0 0 - 12 %M   POC Granulocyte 4.9 2 - 6.9   Granulocyte percent 61.7 37 - 80 %G   RBC 4.27 4.04 - 5.48 M/uL   Hemoglobin 12.6 12.2 - 16.2 g/dL   HCT, POC 36.3 (A) 37.7 - 47.9 %   MCV 85.2 80 - 97 fL   MCH, POC 29.5 27 - 31.2 pg   MCHC 34.6 31.8 - 35.4 g/dL   RDW, POC 13.7 %   Platelet Count, POC 236 142 - 424 K/uL   MPV 7.2 0 - 99.8 fL  POCT glucose (manual entry)  Result Value Ref Range   POC Glucose 103 (A) 70 - 99 mg/dl  POCT glycosylated hemoglobin (Hb A1C)  Result Value Ref Range   Hemoglobin A1C 5.9          Assessment & Plan:  Sugar 103 with an A1c of 5.9. She was given a flu shot today. She is going to follow-up with Dr. Rockwell Germany. No change in medications. The mat and lipid panel pending.I personally performed the services described in this documentation, which was scribed in my presence. The recorded information has been reviewed and is accurate.  [name], MD

## 2016-07-03 NOTE — Patient Instructions (Signed)
     IF you received an x-ray today, you will receive an invoice from [facility]. Please contact  Radiology at [phone] with questions or concerns regarding your invoice.   IF you received labwork today, you will receive an invoice from [facility]. Please contact [facility] at [phone] with questions or concerns regarding your invoice.   Our billing staff will not be able to assist you with questions regarding bills from these companies.  You will be contacted with the lab results as soon as they are available. The fastest way to get your results is to activate your My Chart account. Instructions are located on the last page of this paperwork. If you have not heard from us regarding the results in 2 weeks, please contact this office.      

## 2016-07-05 ENCOUNTER — Encounter: Payer: Self-pay | Admitting: Emergency Medicine

## 2016-07-08 ENCOUNTER — Other Ambulatory Visit: Payer: Self-pay | Admitting: *Deleted

## 2016-08-09 DIAGNOSIS — H40013 Open angle with borderline findings, low risk, bilateral: Secondary | ICD-10-CM | POA: Diagnosis not present

## 2016-08-09 DIAGNOSIS — H43813 Vitreous degeneration, bilateral: Secondary | ICD-10-CM | POA: Diagnosis not present

## 2016-08-09 DIAGNOSIS — H11821 Conjunctivochalasis, right eye: Secondary | ICD-10-CM | POA: Diagnosis not present

## 2016-08-09 DIAGNOSIS — Z961 Presence of intraocular lens: Secondary | ICD-10-CM | POA: Diagnosis not present

## 2016-08-17 DIAGNOSIS — S53402A Unspecified sprain of left elbow, initial encounter: Secondary | ICD-10-CM | POA: Diagnosis not present

## 2016-08-17 DIAGNOSIS — W010XXA Fall on same level from slipping, tripping and stumbling without subsequent striking against object, initial encounter: Secondary | ICD-10-CM | POA: Diagnosis not present

## 2016-08-17 DIAGNOSIS — S8001XA Contusion of right knee, initial encounter: Secondary | ICD-10-CM | POA: Diagnosis not present

## 2016-08-17 DIAGNOSIS — S0083XA Contusion of other part of head, initial encounter: Secondary | ICD-10-CM | POA: Diagnosis not present

## 2016-08-17 DIAGNOSIS — S63502A Unspecified sprain of left wrist, initial encounter: Secondary | ICD-10-CM | POA: Diagnosis not present

## 2016-08-25 ENCOUNTER — Other Ambulatory Visit: Payer: Self-pay | Admitting: Family Medicine

## 2016-08-25 ENCOUNTER — Telehealth: Payer: Self-pay
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
## 2016-09-01 DIAGNOSIS — Z1231 Encounter for screening mammogram for malignant neoplasm of breast: Secondary | ICD-10-CM | POA: Diagnosis not present

## 2016-09-01 LAB — HM MAMMOGRAPHY

## 2016-09-04 ENCOUNTER — Ambulatory Visit (INDEPENDENT_AMBULATORY_CARE_PROVIDER_SITE_OTHER): Payer: Medicare Other | Admitting: Family Medicine

## 2016-09-04 VITALS — BP 120/80 | HR 91 | Temp 98.4°F | Resp 17 | Ht 63.0 in | Wt 174.0 lb

## 2016-09-04 DIAGNOSIS — R059 Cough, unspecified: Secondary | ICD-10-CM

## 2016-09-04 DIAGNOSIS — J209 Acute bronchitis, unspecified: Secondary | ICD-10-CM | POA: Diagnosis not present

## 2016-09-04 DIAGNOSIS — R0789 Other chest pain: Secondary | ICD-10-CM | POA: Diagnosis not present

## 2016-09-04 DIAGNOSIS — R05 Cough: Secondary | ICD-10-CM | POA: Diagnosis not present

## 2016-09-04 MED ORDER — ALBUTEROL SULFATE (2.5 MG/3ML) 0.083% IN NEBU
2.5000 mg | INHALATION_SOLUTION | Freq: Once | RESPIRATORY_TRACT | Status: AC
  Administered 2016-09-04: 2.5 mg via RESPIRATORY_TRACT

## 2016-09-04 MED ORDER — GUAIFENESIN ER 1200 MG PO TB12: 1.0000 | ORAL_TABLET | Freq: Two times a day (BID) | ORAL | 1 refills | Status: DC | PRN

## 2016-09-04 MED ORDER — BENZONATATE 100 MG PO CAPS: 100.0000 mg | ORAL_CAPSULE | Freq: Three times a day (TID) | ORAL | 0 refills | Status: DC | PRN

## 2016-09-04 MED ORDER — AZITHROMYCIN 250 MG PO TABS: ORAL_TABLET | ORAL | 0 refills | Status: DC

## 2016-09-04 NOTE — Progress Notes (Signed)
Subjective:  By signing my name below, I, Tracey Richard, attest that this documentation has been prepared under the direction and in the presence of Delman Cheadle, MD Electronically Signed: Ladene Artist, ED Scribe 09/04/2016 at 4:35 PM.   Patient ID: Tracey Richard, female    DOB: June 06, 1941, 75 y.o.   MRN: [id number]  Chief Complaint  Patient presents with  . Cough    and SOB started yesterday    HPI HPI Comments: Tracey Richard is a 75 y.o. female who presents to the [facility] complaining of chest congestion onset yesterday. Pt describes chest congestion as "chest heaviness". She reports associated symptoms of mild dry cough, hoarseness, rhinorrhea on the right, fatigue and sob. Pt has tried Tylenol without significant relief. She denies fever, chills, ear pain, sore throat, nasal congestion, nausea, diaphoresis. Her husband has been ill with similar symptoms. Pt received her flu vaccine in September. No h/o asthma or pulmonary related illnesses.   Past Medical History:  Diagnosis Date  . Allergy   . Cancer ([place])   . Cataract   . GERD (gastroesophageal reflux disease)   . Hyperlipidemia   . Hypertension   . Stroke [facility])    Current Outpatient Prescriptions on File Prior to Visit  Medication Sig Dispense Refill  . aspirin EC 81 MG tablet Take 81 mg by mouth daily.    . Cinnamon 500 MG capsule Take 500 mg by mouth daily.    . Esomeprazole Magnesium (NEXIUM PO) Take 20 mg by mouth daily.     Marland Kitchen [place] by mouth.    Marland Kitchen imipramine (TOFRANIL) 25 MG tablet TAKE 3 TABLETS (75 MG TOTAL) BY MOUTH AT BEDTIME. 270 tablet 3  . OVER THE COUNTER MEDICATION Calcium, magnesium, zinc, plus D3  (one daily)    . OVER THE COUNTER MEDICATION Fish oil 1200mg  -360mg  omega 3 (takes daily)    . simvastatin (ZOCOR) 40 MG tablet TAKE 1 TABLET (40 MG TOTAL) BY MOUTH DAILY AT BEDTIME 90 tablet 3  . valsartan (DIOVAN) 80 MG tablet TAKE 1 TABLET (80 MG TOTAL) BY  MOUTH DAILY. 90 tablet 0   No current facility-administered medications on file prior to visit.    Allergies  Allergen Reactions  . Penicillins   . Sulfur    Review of Systems  Constitutional: Positive for fatigue. Negative for chills, diaphoresis and fever.  HENT: Positive for rhinorrhea and voice change. Negative for congestion, ear pain and sore throat.   Respiratory: Positive for cough and shortness of breath.   Gastrointestinal: Negative for nausea.   BP 120/80 (BP Location: Left Arm, Patient Position: Sitting, Cuff Size: Large)   Pulse 91   Temp 98.4 F (36.9 C) (Oral)   Resp 17   Ht 5\' 3"  (1.6 m)   Wt 174 lb (78.9 kg)   SpO2 94%   BMI 30.82 kg/m     Objective:   Physical Exam  Constitutional: She is oriented to person, place, and time. She appears well-developed and well-nourished. No distress.  HENT:  Head: Normocephalic and atraumatic.  Right Ear: Tympanic membrane is erythematous and retracted.  Left Ear: Tympanic membrane is erythematous and retracted.  Mouth/Throat: Oropharynx is clear and moist.  Eyes: Conjunctivae and EOM are normal.  Neck: Neck supple. No tracheal deviation present. No thyromegaly present.  Cardiovascular: Normal rate, regular rhythm, S1 normal, S2 normal and normal heart sounds.   Pulmonary/Chest: Effort normal and breath sounds normal. No respiratory  distress.  Musculoskeletal: Normal range of motion.  Lymphadenopathy:    She has no cervical adenopathy.  Neurological: She is alert and oriented to person, place, and time.  Skin: Skin is warm and dry.  Psychiatric: She has a normal mood and affect. Her behavior is normal.  Nursing note and vitals reviewed.  EKG: Normal sinus rhythm.     Assessment & Plan:   1. Cough   2. Chest tightness   3. Acute bronchitis, unspecified organism     Orders Placed This Encounter  Procedures  . EKG 12-Lead    Meds ordered this encounter  Medications  . albuterol (PROVENTIL) (2.5 MG/3ML)  0.083% nebulizer solution 2.5 mg  . benzonatate (TESSALON) 100 MG capsule    Sig: Take 1-2 capsules (100-200 mg total) by mouth 3 (three) times daily as needed for cough.    Dispense:  60 capsule    Refill:  0  . azithromycin (ZITHROMAX) 250 MG tablet    Sig: Take 2 tabs PO x 1 dose, then 1 tab PO QD x 4 days    Dispense:  6 tablet    Refill:  0  . Guaifenesin (MUCINEX MAXIMUM STRENGTH) 1200 MG TB12    Sig: Take 1 tablet (1,200 mg total) by mouth every 12 (twelve) hours as needed.    Dispense:  14 tablet    Refill:  1    I personally performed the services described in this documentation, which was scribed in my presence. The recorded information has been reviewed and considered, and addended by me as needed.   Delman Cheadle, M.D.  [facility] [address] [place], [place] [address] [phone] phone [phone] fax  09/06/16 11:17 AM

## 2016-09-04 NOTE — Patient Instructions (Addendum)
IF you received an x-ray today, you will receive an invoice from [facility]. Please contact [facility] at [phone] with questions or concerns regarding your invoice.   IF you received labwork today, you will receive an invoice from [facility]. Please contact [facility] at [phone] with questions or concerns regarding your invoice.   Our billing staff will not be able to assist you with questions regarding bills from these companies.  You will be contacted with the lab results as soon as they are available. The fastest way to get your results is to activate your My Chart account. Instructions are located on the last page of this paperwork. If you have not heard from [place] regarding the results in 2 weeks, please contact this office.     We recommend that you schedule a mammogram for breast cancer screening. Typically, you do not need a referral to do this. Please contact a local imaging center to schedule your mammogram.  [facility] - [phone]  *ask for the Radiology Department [facility] - [phone] or [phone]  [facility] - [phone] [place] [phone] [facility] - [phone]  *ask for the [facility] - [phone]  *ask for the Radiology Department [facility] - [phone]  *ask for the Mammography Department [facility] - [phone]    Acute Bronchitis Bronchitis is inflammation of the airways that extend from the windpipe into the lungs (bronchi). The inflammation often causes mucus to develop. This leads to a cough, which is the most common symptom of bronchitis.  In acute bronchitis, the condition usually develops suddenly and goes away over time, usually in a couple weeks. Smoking, allergies, and asthma can make bronchitis worse. Repeated episodes of bronchitis  may cause further lung problems.  CAUSES Acute bronchitis is most often caused by the same virus that causes a cold. The virus can spread from person to person (contagious) through coughing, sneezing, and touching contaminated objects. SIGNS AND SYMPTOMS   Cough.   Fever.   Coughing up mucus.   Body aches.   Chest congestion.   Chills.   Shortness of breath.   Sore throat.  DIAGNOSIS  Acute bronchitis is usually diagnosed through a physical exam. Your health care provider will also ask you questions about your medical history. Tests, such as chest X-rays, are sometimes done to rule out other conditions.  TREATMENT  Acute bronchitis usually goes away in a couple weeks. Oftentimes, no medical treatment is necessary. Medicines are sometimes given for relief of fever or cough. Antibiotic medicines are usually not needed but may be prescribed in certain situations. In some cases, an inhaler may be recommended to help reduce shortness of breath and control the cough. A cool mist vaporizer may also be used to help thin bronchial secretions and make it easier to clear the chest.  HOME CARE INSTRUCTIONS  Get plenty of rest.   Drink enough fluids to keep your urine clear or pale yellow (unless you have a medical condition that requires fluid restriction). Increasing fluids may help thin your respiratory secretions (sputum) and reduce chest congestion, and it will prevent dehydration.   Take medicines only as directed by your health care provider.  If you were prescribed an antibiotic medicine, finish it all even if you start to feel better.  Avoid smoking and secondhand smoke. Exposure to cigarette smoke or irritating chemicals will  make bronchitis worse. If you are a smoker, consider using nicotine gum or skin patches to help control withdrawal symptoms. Quitting smoking will help your lungs heal faster.   Reduce the chances of another bout of acute bronchitis by washing your  hands frequently, avoiding people with cold symptoms, and trying not to touch your hands to your mouth, nose, or eyes.   Keep all follow-up visits as directed by your health care provider.  SEEK MEDICAL CARE IF: Your symptoms do not improve after 1 week of treatment.  SEEK IMMEDIATE MEDICAL CARE IF:  You develop an increased fever or chills.   You have chest pain.   You have severe shortness of breath.  You have bloody sputum.   You develop dehydration.  You faint or repeatedly feel like you are going to pass out.  You develop repeated vomiting.  You develop a severe headache. MAKE SURE YOU:   Understand these instructions.  Will watch your condition.  Will get help right away if you are not doing well or get worse.   This information is not intended to replace advice given to you by your health care provider. Make sure you discuss any questions you have with your health care provider.   Document Released: 11/16/2004 Document Revised: 10/30/2014 Document Reviewed: 04/01/2013 Elsevier Interactive Patient Education [employer].

## 2016-09-05 DIAGNOSIS — M25561 Pain in right knee: Secondary | ICD-10-CM | POA: Diagnosis not present

## 2016-09-05 DIAGNOSIS — M25522 Pain in left elbow: Secondary | ICD-10-CM | POA: Diagnosis not present

## 2016-09-05 DIAGNOSIS — M25532 Pain in left wrist: Secondary | ICD-10-CM | POA: Diagnosis not present

## 2016-09-07 DIAGNOSIS — R922 Inconclusive mammogram: Secondary | ICD-10-CM | POA: Diagnosis not present

## 2016-09-07 DIAGNOSIS — R928 Other abnormal and inconclusive findings on diagnostic imaging of breast: Secondary | ICD-10-CM | POA: Diagnosis not present

## 2016-09-12 DIAGNOSIS — M25522 Pain in left elbow: Secondary | ICD-10-CM | POA: Diagnosis not present

## 2016-09-12 DIAGNOSIS — M25532 Pain in left wrist: Secondary | ICD-10-CM | POA: Diagnosis not present

## 2016-09-22 ENCOUNTER — Other Ambulatory Visit: Payer: Self-pay | Admitting: Emergency Medicine

## 2016-09-24 NOTE — Telephone Encounter (Signed)
06/2016 last ov and labs 

## 2016-11-14 ENCOUNTER — Ambulatory Visit (INDEPENDENT_AMBULATORY_CARE_PROVIDER_SITE_OTHER): Payer: Medicare Other | Admitting: Neurology

## 2016-11-14 ENCOUNTER — Encounter: Payer: Self-pay | Admitting: Neurology

## 2016-11-14 VITALS — BP 142/77 | HR 82 | Resp 20 | Ht 63.0 in | Wt 176.0 lb

## 2016-11-14 DIAGNOSIS — Z9989 Dependence on other enabling machines and devices: Secondary | ICD-10-CM | POA: Diagnosis not present

## 2016-11-14 DIAGNOSIS — G4733 Obstructive sleep apnea (adult) (pediatric): Secondary | ICD-10-CM | POA: Diagnosis not present

## 2016-11-14 MED ORDER — IMIPRAMINE HCL 25 MG PO TABS: ORAL_TABLET | ORAL | 3 refills | Status: DC

## 2016-11-14 NOTE — Patient Instructions (Signed)
Imipramine tablets What is this medicine? IMIPRAMINE (im IP ra meen) is used to treat depression. This medicine can also help children who have a nighttime bed wetting problem. This medicine may be used for other purposes; ask your health care provider or pharmacist if you have questions. COMMON BRAND NAME(S): Tofranil What should I tell my health care provider before I take this medicine? They need to know if you have any of these conditions: -an alcohol problem -asthma, difficulty breathing -bipolar disorder or schizophrenia -difficulty passing urine, prostate trouble -fast or irregular heart beat -glaucoma -heart disease or recent heart attack -kidney or liver disease -seizures -stroke -thoughts or plans of suicide, a previous suicide attempt, or family history of suicide attempt -thyroid disease -an unusual or allergic reaction to imipramine, other medicines, foods, dyes, or preservatives -pregnant or trying to get pregnant -breast-feeding How should I use this medicine? Take this medicine by mouth with a glass of water. Follow the directions on the prescription label. Take your doses at regular intervals. Do not take your medicine more often than directed. Do not stop taking this medicine suddenly except upon the advice of your doctor. Stopping this medicine too quickly may cause serious side effects or your condition may worsen. A special MedGuide will be given to you by the pharmacist with each prescription and refill. Be sure to read this information carefully each time. Talk to your pediatrician regarding the use of this medicine in children. Special care may be needed. While this drug may be prescribed for children as young as 6 years for selected conditions, precautions do apply. Overdosage: If you think you have taken too much of this medicine contact a poison control center or emergency room at once. NOTE: This medicine is only for you. Do not share this medicine with  others. What if I miss a dose? If you miss a dose, take it as soon as you can. If it is almost time for your next dose, take only that dose. Do not take double or extra doses. What may interact with this medicine? Do not take this medicine with any of the following medications: -amoxapine -arsenic trioxide -certain medicines used to regulate abnormal heartbeat or to treat other heart conditions -cisapride -cocaine -grepafloxacin -halofantrine -levomethadyl -linezolid -MAOIs like Carbex, Eldepryl, Marplan, Nardil, and Parnate -methylene blue (injected into a vein) -other medicines for mental depression -phenothiazines like perphenazine, thioridazine and chlorpromazine -pimozide -procarbazine -sparfloxacin -St. John's Wort -ziprasidone This medicine may also interact with the following medications: -atropine and related drugs like hyoscyamine, scopolamine, tolterodine and others -barbiturate medicines for inducing sleep or treating seizures like phenobarbital -cimetidine -clonidine -local anesthetics -medicines for high blood pressure -prescription pain medications -seizure or epilepsy medicine such as carbamazepine or phenytoin -stimulants like dexmethylphenidate or methylphenidate -thyroid hormones This list may not describe all possible interactions. Give your health care provider a list of all the medicines, herbs, non-prescription drugs, or dietary supplements you use. Also tell them if you smoke, drink alcohol, or use illegal drugs. Some items may interact with your medicine. What should I watch for while using this medicine? Tell your doctor if your symptoms do not get better or if they get worse. Visit your doctor or health care professional for regular checks on your progress. Because it may take several weeks to see the full effects of this medicine, it is important to continue your treatment as prescribed by your doctor. Patients and their families should watch out for  new or worsening thoughts of  suicide or depression. Also watch out for sudden changes in feelings such as feeling anxious, agitated, panicky, irritable, hostile, aggressive, impulsive, severely restless, overly excited and hyperactive, or not being able to sleep. If this happens, especially at the beginning of treatment or after a change in dose, call your health care professional. Dennis Bast may get drowsy or dizzy. Do not drive, use machinery, or do anything that needs mental alertness until you know how this medicine affects you. Do not stand or sit up quickly, especially if you are an older patient. This reduces the risk of dizzy or fainting spells. Alcohol may interfere with the effect of this medicine. Avoid alcoholic drinks. Do not treat yourself for coughs, colds, or allergies without asking your doctor or health care professional for advice. Some ingredients can increase possible side effects. Your mouth may get dry. Chewing sugarless gum or sucking hard candy, and drinking plenty of water may help. Contact your doctor if the problem does not go away or is severe. This medicine may cause dry eyes and blurred vision. If you wear contact lenses you may feel some discomfort. Lubricating drops may help. See your eye doctor if the problem does not go away or is severe. This medicine can cause constipation. Try to have a bowel movement at least every 2 to 3 days. If you do not have a bowel movement for 3 days, call your doctor or health care professional. This medicine can make you more sensitive to the sun. Keep out of the sun. If you cannot avoid being in the sun, wear protective clothing and use sunscreen. Do not use sun lamps or tanning beds/booths. What side effects may I notice from receiving this medicine? Side effects that you should report to your doctor or health care professional as soon as possible: -allergic reactions like skin rash, itching or hives, swelling of the face, lips, or  tongue -anxious -breathing problems -changes in vision -confusion -elevated mood, decreased need for sleep, racing thoughts, impulsive behavior -eye pain -fast, irregular heartbeat -feeling faint or lightheaded, falls -feeling agitated, angry, or irritable -fever with increased sweating -hallucination, loss of contact with reality -seizures -stiff muscles -suicidal thoughts or other mood changes -tingling, pain, or numbness in the feet or hands -trouble passing urine or change in the amount of urine -trouble sleeping -unusually weak or tired -vomiting -yellowing of the eyes or skin Side effects that usually do not require medical attention (report to your doctor or health care professional if they continue or are bothersome): -change in sex drive or performance -change in appetite or weight -constipation -dizziness -dry mouth -nausea -tired -tremors -upset stomach This list may not describe all possible side effects. Call your doctor for medical advice about side effects. You may report side effects to FDA at 1-800-FDA-1088. Where should I keep my medicine? Keep out of the reach of children. Store at room temperature between 20 and 25 degrees C (68 and 77 degrees F). Keep container tightly closed. Throw away any unused medicine after the expiration date. NOTE: This sheet is a summary. It may not cover all possible information. If you have questions about this medicine, talk to your doctor, pharmacist, or health care provider.  2017 Elsevier/Gold Standard (2016-03-10 12:49:25)

## 2016-11-14 NOTE — Progress Notes (Signed)
[facility]EP MED[facility]INE CLINIC   Provider:  [name], M D  Referring Provider: [name], MD Primary Care Physician:  Gara Kroner, MD  Chief Complaint  Patient presents with  . Follow-up    cpap   Chief complaint according to patient :"  I may have apnea, had a stroke "   HPI:  Tracey Richard is a 76 y.o. female , seen here as a referral from Dr. Everlene Farrier for a sleep consultation,   Tracey Richard states that lifelong she has been a good sleeper. She always has been easy to go to sleep and stay asleep, and that she requires 12-14 hours of sleep easily. She may still take naps, lasting several hours. She remembers being a sleepy teenager. She married at age 70 - and her husband mentioned to her thatshe spneds more time in bed than anyone he ever knew. She would wake up with headaches. This  summer she went with friends to the [place] area by car and she slept throughout the trip,  she does not remember seeing the [place]!  She sleeps for many years on 3 tabs of imipramine, originally, decades ago , prescribed for headaches and pain (?).  She was hospitalized for possible stroke-TIA on 04/21/2015 and during the observation. Nurses had witnessed her not just to snore but to stop breathing intermittently. The patient then saw her primary care physician, Dr. Ivar Bury. His note from 05-07-15 reflects that the patient had a hemorrhagic stroke and therefore is not taking any baby aspirin she had a very mild headache no gait abnormality no motor abnormality since a stroke. She usually has well-controlled hypertension and diabetes he mentioned. The stroke affected the left basal ganglia and was occurring on 6-29. 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 She also has undergone spine surgery.  Sleep habits are as  follows: since her retirement in June 2011 she goes to bed later , watches more TV and  works late at the computer screen.  She does not have an established bedtime but her habit is to go to bed at 3-4 Am- and will sleep for at least 8 hours. She reports that she has vivid dreams every night, often if she wakes out off dream stages she will go back to bed and continues the same story line. She does not report any sleep paralysis but dream intrusion. She is falling asleep very easily when not stimulated or physically active. Yesterday she took a 20 minute power nap and this refresh as her. During her nocturnal sleep she does not have to go to the bathroom or rarely. Her gentleman friend states that she does snore but he has not noted her to have apnea or stop to breathe. She is also not restlessly tossing and turning. He has noted her to twitch or kick at night. She denies any irresistible urge to move before bedtime or before going to sleep. Her boyfriend is a early bird , but he gets a usually sleep until 8 or 9 AM at least. This especially when that does travel together. She drinks coffee in the morning about 2 cups, she rarely drinks iced tea or sodas and not in the afternoon.  Sleep medical history and family sleep history:  She does not recall hearing her parents or siblings snoring. Social history:  HS, lives with a  female friend, divorced,  No biological children.   Interval history from 11/16/2015; Tracey Richard had extensive new medical events happening between her last visit with me and today. She was admitted on June 27 with a possible stroke, Dr. said his note is to be seen above. I had ordered a sleep study for the patient in the meantime she had suffered a stroke. The date of recording for her polysomnography was 07-14-15. The study documented mild sleep apnea with an AHI of 7.5 in supine of 13. She did have an oxygen nadir at 87% saturation and 17.8 minutes of desaturation. She had  significant sleep disruption from periodic limb movements twitching at night to the level that it aroused her from sleep. This index was 8.0 -I suggested that the patient should undergo treatment for the restless legs and  I will order a CPAP titration allowing the patient to get used to CPAP therapy before we may reschedule an MS LT - depending on her degree of sleepiness.  The date of the baseline sleep study she endorsed the Epworth score at 17 points. She was titrated to CPAP she did not feel the need to continue restless leg medication.  She was titrated to CPAP of 12 cm water and started on an auto CPAP between 5 and 15 with a nasal pillow mask. In the meantime she has changed to a full face mask feeling that this is more comfortable for her. She does not have issues with claustrophobia. Her Epworth score has decreased to only 11 points, her hypertension is well treated and controlled at this point. Her fatigue score was endorsed at only 14 points. Reactive depression score at 0 points The patient has been 100% compliant with CPAP use and over 90% compliance with the consecutive 4 hour use. Average user time daily 7 hours and 37 minutes. She is using the AutoSet between 5 and 15 with a 3 cm EPR, the 95th percentile pressure now is 14.2 cm water. The residual AHI is 4.3 which is a reduction of about 50%. She feels she sleeps well she is restored and refreshed in the morning and also she did not have a high degree of apnea the treatment of apnea seems to have reduced her periodic limb movements. There is no longer any evidence of restless legs or periodic limb movements interrupting her sleep. The patient will continue to use the AutoSet at the current settings. The interface has not bothered her at all and the change to a full face mask did not lead to major air leaks. The nasal pillows caused nasal blisters.  Interval history from 11/14/2016, I have the pleasure of seeing Tracey Richard today who has  been highly compliant CPAP user and has used to machine 25 of the last 30 days over 4 hours compliance is 83%, average user time is 7 hours 4 minutes, the residual AHI is 3.1. She's using an AutoSet between 5 and 15 cm water with 3 cm expiratory pressure relief. She feels well using CPAP sleeping well, sounder and she still using imipramine which helps helped her to feel less stiff has less joint pain and also sleep somewhat better. She endorsed no other symptoms today a geographic depression score was rated at 0 points Epworth sleepiness score at 6 points and fatigue severity score was not evaluated.  Review of Systems: Out of a complete 14 system review, the patient complains of only the following symptoms, and all other reviewed systems are negative. Epworth score  6 from 17 , Fatigue severity score NA from  48  , depression score  0.   Social History   Social History  . Marital status: Single    Spouse name: N/A  . Number of children: N/A  . Years of education: N/A   Occupational History  . retired    Social History Main Topics  . Smoking status: Never Smoker  . Smokeless tobacco: Never Used  . Alcohol use 0.6 oz/week    1 Glasses of wine per week     Comment: 1 every 6 mts   . Drug use: No  . Sexual activity: No   Other Topics Concern  . Not on file   Social History Narrative   Walks daily. Divorced. Education: [employer].      Drinks 4 cups of caffeine daily.    Family History  Problem Relation Age of Onset  . Cancer Mother     vaginal cancer, also HTN  . Prostate cancer Father     also HTN, dementia  . Diabetes Brother     Past Medical History:  Diagnosis Date  . Allergy   . Cancer ([place])   . Cataract   . GERD (gastroesophageal reflux disease)   . Hyperlipidemia   . Hypertension   . Stroke [facility])     Past Surgical History:  Procedure Laterality Date  . APPENDECTOMY  1963  . BACK SURGERY  2007  . BREAST SURGERY    . DILATION AND CURETTAGE OF UTERUS      . EYE SURGERY  1999   bilateral cataract extraction  . moles  1965  . SPINE SURGERY    . TONSILLECTOMY  1959    Current Outpatient Prescriptions  Medication Sig Dispense Refill  . aspirin EC 81 MG tablet Take 81 mg by mouth daily.    . benzonatate (TESSALON) 100 MG capsule Take 1-2 capsules (100-200 mg total) by mouth 3 (three) times daily as needed for cough. 60 capsule 0  . Cinnamon 500 MG capsule Take 500 mg by mouth daily.    Marland Kitchen doxycycline (VIBRA-TABS) 100 MG tablet     . Esomeprazole Magnesium (NEXIUM PO) Take 20 mg by mouth daily.     Marland Kitchen Nunez by mouth.    . Guaifenesin (MUCINEX MAXIMUM STRENGTH) 1200 MG TB12 Take 1 tablet (1,200 mg total) by mouth every 12 (twelve) hours as needed. 14 tablet 1  . imipramine (TOFRANIL) 25 MG tablet TAKE 3 TABLETS (75 MG TOTAL) BY MOUTH AT BEDTIME. 270 tablet 3  . OVER THE COUNTER MEDICATION Calcium, magnesium, zinc, plus D3  (one daily)    . OVER THE COUNTER MEDICATION Fish oil 1200mg  -360mg  omega 3 (takes daily)    . simvastatin (ZOCOR) 40 MG tablet TAKE 1 TABLET (40 MG TOTAL) BY MOUTH DAILY AT BEDTIME 90 tablet 3  . valsartan (DIOVAN) 80 MG tablet TAKE ONE TABLET BY MOUTH DAILY 90 tablet 0   No current facility-administered medications for this visit.     Allergies as of 11/14/2016 - Review Complete 11/14/2016  Allergen Reaction Noted  . Penicillins  06/21/2011  . Sulfur  06/21/2011    Vitals: BP (!) 142/77   Pulse 82   Resp 20   Ht 5\' 3"  (1.6 m)   Wt 176 lb (79.8 kg)   BMI 31.18 kg/m  Last Weight:  Wt Readings from Last 1 Encounters:  11/14/16 176 lb (79.8 kg)   [id number] mass index is 31.18 kg/m.  Last Height:   Ht Readings from Last 1 Encounters:  11/14/16 5\' 3"  (1.6 m)    Physical exam:  General: The patient is awake, alert and appears not in acute distress. The patient is well groomed. Head: Normocephalic, atraumatic. Neck is supple. Mallampati 3  neck circumference: 15.5 . Nasal airflow  unrestricted, TMJ is not evident . Retrognathia is not seen.  Cardiovascular:  Regular rate and rhythm, without  murmurs or carotid bruit, and without distended neck veins. Respiratory: Lungs are clear to auscultation. Skin:  Without evidence of edema, or rash. Trunk: BMI is 30. The patient's posture is erect.  Neurologic exam : The patient is awake and alert, oriented to place and time.  Memory subjective described as intact, she reports some delayed word finding. Speech is fluent without dysarthria or aphasia. Mood and affect are appropriate.  Cranial nerves: Pupils are equal and briskly reactive to light. Extraocular movements  in vertical and horizontal planes intact and without nystagmus. Visual fields by finger perimetry are intact. Hearing to finger rub intact. Facial sensation intact to fine touch.Facial motor strength is symmetric and tongue and uvula move midline. Shoulder shrug was symmetrical.  Motor exam: Normal tone, muscle bulk and symmetric strength in all extremities. Deep tendon reflexes: in the  upper and lower extremities are symmetric and intact. Babinski maneuver response is downgoing.  The patient was advised of the nature of the diagnosed sleep disorder, the treatment options and risks for general a health and wellness arising from not treating the condition.  I spent more than 25 minutes of face to face time with the patient.  Greater than 50% of time was spent in counseling and coordination of care. We have discussed the diagnosis and differential and I answered the patient's questions.  Imipramine was suggested to be d/c but the patient wants to keep it on, sleeping sound and waking less stiff in AM.    Assessment:  After physical and neurologic examination, review of  Imaging studies ,  Results of polysomnography/ neurophysiology testing and pre-existing records as far as provided in visit., my assessment is   1) Stroke Ms Akram is a 76 year old Caucasian lady with  intracerebral hemorrhage in June 2016. Patient is right handed.    2)  OSA, snoring and hypersomnia. Vivid dreams- but poor sleep habits.!  Improved drastically after her overall mild sleep apnea was treated with CPAP, she could discontinue the  PLM medication and desn't need Modafinil now.  She was 90% compliant with CPAP, now using a FFM and has not longer uncontrolled HTN, her PLMs resolved. No further narcolepsy work up necessary.   Plan:  Treatment plan and additional workup : Continue auto CPAP 5-15 cm water. Off ASA 81 mg per Dr Leonie Man. Imipramine- on reduced dose , 2 tabs a day -  patient reports it helps her sleep and she is less stiff and achy in AM - I will leave her on it  !   RV in one year with auto- PAP compliance review.   Asencion Partridge Nehal Shives MD  11/14/2016   CC: [name], [address] [place], [place] [id number]

## 2016-12-19 ENCOUNTER — Other Ambulatory Visit: Payer: Self-pay | Admitting: Family Medicine

## 2016-12-26 ENCOUNTER — Ambulatory Visit (INDEPENDENT_AMBULATORY_CARE_PROVIDER_SITE_OTHER): Payer: Medicare Other | Admitting: Nurse Practitioner

## 2016-12-26 ENCOUNTER — Encounter: Payer: Self-pay | Admitting: Nurse Practitioner

## 2016-12-26 VITALS — BP 124/80 | HR 68 | Wt 176.6 lb

## 2016-12-26 DIAGNOSIS — I671 Cerebral aneurysm, nonruptured: Secondary | ICD-10-CM | POA: Diagnosis not present

## 2016-12-26 DIAGNOSIS — I612 Nontraumatic intracerebral hemorrhage in hemisphere, unspecified: Secondary | ICD-10-CM | POA: Diagnosis not present

## 2016-12-26 DIAGNOSIS — E785 Hyperlipidemia, unspecified: Secondary | ICD-10-CM

## 2016-12-26 DIAGNOSIS — I10 Essential (primary) hypertension: Secondary | ICD-10-CM

## 2016-12-26 NOTE — Progress Notes (Signed)
[facility]  PATIENT: Tracey Richard DOB: 03/08/1941   REASON FOR VISIT: Follow-up for history of intracerebral hemorrhage  HISTORY FROM: Patient  HISTORY OF PRESENT ILLNESS: HISTORY PSMs Tracey Richard is a 76 year old Caucasian lady seen today for first office follow-up visit following admission for intracerebral hemorrhage in June 2016.Tracey Richard is a 76 y.o. female who reports that yesterday she awakened normal. Went for a walk with her friend and noted that she was Walking as if drunk. She was going to the right. She was taken back home and went to bed early which is unusual for her. Awakened this afternoon and her gait was much better but was noted to drop the mail from her right hand. Also noted when she went out to dinner that she was dropping her fork. Presented for evaluation at that time. Patient is right handed.  Date last known well: Date: 04/19/2015 Time last known well: Time: 16:30 tPA Given: No: ICH.Marland Kitchen She was admitted to the intensive care unit where she underwent close neurological monitoring as well as tight blood pressure control. Her blood pressure was not particularly elevated and was he controlled. She was cleared by speech therapy and started on no medications. MRI scan was obtained later on the day of admission and confirmed left basal ganglia hemorrhage without any underlying lesion. There is no significant mass effect, midline shift or intraventricular extension. Patient underwent echocardiogram, carotid Doppler both of which were normal. She had only minimal weakness of the right hand muscles which was improving at the time of discharge. She was seen by physical occupational speech therapy and found to have no therapy needs and was felt stable to be discharged home . She has done well and has not had any recurrent neurological symptoms. She states her blood pressure is well controlled. She in fact traveled to [place] to attend her  granddaughter's wedding. She is independent in all activities of daily living. She is very active. She admits to mild short-term memory difficulties which she states these existed even prior to her hemorrhage and are not any worse now. 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 Update 12/23/2015 PS: She returns for follow-up after last visit 6 months ago. She continues to do well without recurrent stroke or TIA symptoms. She's had only one episode of minor headache which she rated as 1 out of 10 and did not even need medication. She had a follow-up MRI scan of the brain on 07/11/15 which I personally reviewed and shows satisfactory resolution of the basal ganglia hemorrhage. 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 Patient is doing extremely well with no physical deficits from a brain hemorrhage. She's noticed some occasional short-term memory difficulties but these are not progressive or bothersome. She states her blood pressure is well controlled and today it is 117/99. She's tolerating Zocor well without muscle aches and pains. She has no complaints today. UPDATE 09/05/2017CM Tracey Richard, 76 year old female returns for follow-up. She has not had further stroke or TIA symptoms. Follow-up MRI scan of the brain on 07/11/2015 shows satisfactory resolution of the basal ganglia hemorrhage. 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 She is currently on aspirin without further stroke or TIA symptoms. She has minimal bruising. Blood pressure is well controlled. She is continuing to exercise by walking several miles a day. She denies any aches or  pains from her Zocor. She returns for follow-up  UPDATE 03/06/2018CM Tracey Richard, 76 year old female returns for follow-up with history of intracerebral hemorrhage. She is back on aspirin without further stroke or TIA symptoms. She has  minimal bruising or bleeding MRA of the brain 2016 showed stable left MCA aneurysm. Patient is doing well without physical deficits. Blood pressure well controlled in the office today at 124/80 she is continuing to exercise. She has obstructive sleep apnea and is compliant with her CPAP. She remains on Zocor for hyperlipidemia followed by her primary care . She denies any myalgias. She has had no interval medical issues. She returns for reevaluation REVIEW OF SYSTEMS: Full 14 system review of systems performed and notable only for those listed, all others are neg:  Constitutional: neg  Cardiovascular: neg Ear/Nose/Throat: neg  Skin: neg Eyes: neg Respiratory: neg Gastroitestinal: neg  Hematology/Lymphatic: neg  Endocrine: neg Musculoskeletal:neg Allergy/Immunology: neg Neurological: neg Psychiatric: neg Sleep : Obstructive sleep apnea with CPAP   ALLERGIES: Allergies  Allergen Reactions  . Penicillins   . Sulfur     HOME MEDICATIONS: Outpatient Medications Prior to Visit  Medication Sig Dispense Refill  . aspirin EC 81 MG tablet Take 81 mg by mouth daily.    . Cinnamon 500 MG capsule Take 500 mg by mouth daily.    . Esomeprazole Magnesium (NEXIUM PO) Take 20 mg by mouth daily.     Marland Kitchen [place] by mouth.    Marland Kitchen imipramine (TOFRANIL) 25 MG tablet TAKE 3 TABLETS (75 MG TOTAL) BY MOUTH AT BEDTIME. 270 tablet 3  . OVER THE COUNTER MEDICATION Calcium, magnesium, zinc, plus D3  (one daily)    . OVER THE COUNTER MEDICATION Fish oil 1200mg  -360mg  omega 3 (takes daily)    . simvastatin (ZOCOR) 40 MG tablet TAKE 1 TABLET (40 MG TOTAL) BY MOUTH DAILY AT BEDTIME 90 tablet 3  . valsartan (DIOVAN) 80 MG tablet TAKE ONE TABLET BY MOUTH DAILY 90 tablet 0  . benzonatate (TESSALON) 100 MG capsule Take 1-2 capsules (100-200 mg total) by mouth 3 (three) times daily as needed for cough. (Patient not taking: Reported on 12/26/2016) 60 capsule 0  . doxycycline (VIBRA-TABS) 100 MG tablet      . Guaifenesin (MUCINEX MAXIMUM STRENGTH) 1200 MG TB12 Take 1 tablet (1,200 mg total) by mouth every 12 (twelve) hours as needed. (Patient not taking: Reported on 12/26/2016) 14 tablet 1   No facility-administered medications prior to visit.     PAST MEDICAL HISTORY: Past Medical History:  Diagnosis Date  . Allergy   . Cancer ([place])   . Cataract   . GERD (gastroesophageal reflux disease)   . Hyperlipidemia   . Hypertension   . Stroke [facility])     PAST SURGICAL HISTORY: Past Surgical History:  Procedure Laterality Date  . APPENDECTOMY  1963  . BACK SURGERY  2007  . BREAST SURGERY    . DILATION AND CURETTAGE OF UTERUS    . EYE SURGERY  1999   bilateral cataract extraction  . moles  1965  . SPINE SURGERY    . TONSILLECTOMY  1959    FAMILY HISTORY: Family History  Problem Relation Age of Onset  . Cancer Mother     vaginal cancer, also HTN  . Prostate cancer Father     also HTN, dementia  . Diabetes Brother     SOCIAL HISTORY: Social History   Social History  . Marital status: Single    Spouse name:  N/A  . Number of children: N/A  . Years of education: N/A   Occupational History  . retired    Social History Main Topics  . Smoking status: Never Smoker  . Smokeless tobacco: Never Used  . Alcohol use 0.6 oz/week    1 Glasses of wine per week     Comment: 1 every 6 mts   . Drug use: No  . Sexual activity: No   Other Topics Concern  . Not on file   Social History Narrative   Walks daily. Divorced. Education: [employer].      Drinks 4 cups of caffeine daily.     PHYSICAL EXAM  Vitals:   12/26/16 1117  BP: 124/80  Pulse: 68  Weight: 176 lb 9.6 oz (80.1 kg)   Body mass index is 31.28 kg/m.  Generalized: Well developed, obese female in no acute distress  Head: normocephalic and atraumatic,. Oropharynx benign  Neck: Supple, no carotid bruits  Cardiac: Regular rate rhythm, no murmur  Musculoskeletal: No deformity   Neurological examination    Mentation: Alert oriented to time, place, history taking. Attention span and concentration appropriate. Recent and remote memory intact.  Follows all commands speech and language fluent.  Cranial nerve II-XII: Fundoscopic exam reveals sharp disc margins.Pupils were equal round reactive to light extraocular movements were full, visual field were full on confrontational test. Facial sensation and strength were normal. hearing was intact to finger rubbing bilaterally. Uvula tongue midline. head turning and shoulder shrug were normal and symmetric.Tongue protrusion into cheek strength was normal. Motor: normal bulk and tone, full strength in the BUE, BLE, fine finger movements normal, no pronator drift. No focal weakness Sensory: normal and symmetric to light touch, pinprick, and  Vibration, in the upper and lower extremities Coordination: finger-nose-finger, heel-to-shin bilaterally, no dysmetria, no tremor Reflexes: 1+ upper lower and symmetric, plantar responses were flexor bilaterally. Gait and Station: Rising up from seated position without assistance, normal stance,  moderate stride, good arm swing, smooth turning, able to perform tiptoe, and heel walking without difficulty. Tandem gait is steady. No assistive device DIAGNOSTIC DATA (LABS, IMAGING, TESTING) - I reviewed patient records, labs, notes, testing and imaging myself where available.  Lab Results  Component Value Date   WBC 8.0 07/03/2016   HGB 12.6 07/03/2016   HCT 36.3 (A) 07/03/2016   MCV 85.2 07/03/2016   PLT 248 04/21/2015      Component Value Date/Time   NA 141 07/03/2016 0835   K 4.3 07/03/2016 0835   CL 105 07/03/2016 0835   CO2 27 07/03/2016 0835   GLUCOSE 101 (H) 07/03/2016 0835   BUN 18 07/03/2016 0835   CREATININE 0.98 (H) 07/03/2016 0835   CALCIUM 9.5 07/03/2016 0835   PROT 6.5 07/03/2016 0835   ALBUMIN 3.6 07/03/2016 0835   AST 16 07/03/2016 0835   ALT 13 07/03/2016 0835   ALKPHOS 44 07/03/2016 0835    BILITOT 0.4 07/03/2016 0835   GFRNONAA 57 (L) 07/03/2016 0835   GFRAA 66 07/03/2016 0835   Lab Results  Component Value Date   CHOL 155 07/03/2016   HDL 58 07/03/2016   LDLCALC 82 07/03/2016   TRIG 73 07/03/2016   CHOLHDL 2.7 07/03/2016   Lab Results  Component Value Date   HGBA1C 5.9 07/03/2016    ASSESSMENT AND PLAN 76 year Caucasian lady with small left basal ganglia hemorrhage likely due to hypertension in June 2016 with a 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 She also has  risk factors of hyperlipidemia She is doing extremely well The patient is a current patient of Dr. Leonie Man  who is out of the office today . This note is sent to the work in doctor.     Continue  aspirin 81 mg daily  for secondary stroke prevention  Maintain strict control of hypertension with blood pressure goal below 130/90, today's reading  124/80  Lipids with LDL cholesterol goal below 70 mg/dL. 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 Follow up yearly I spent 25 min in total face to face time with the patient more than 50% of which was spent counseling and coordination of care, reviewing test results reviewing medications and discussing and reviewing the diagnosis of intracranial hemorrhage management of stroke risk factors and continued monitoring of aneurysm Dennie Bible, [facility], [facility], [facility] [address] lipids are [address] [place], [place] [address] [phone]

## 2016-12-26 NOTE — Progress Notes (Signed)
I agree with the assessment and plan as directed by NP .The patient is known to me .   Ala Kratz, MD  

## 2016-12-26 NOTE — Patient Instructions (Signed)
Continue  aspirin 81 mg daily  for secondary stroke prevention  Maintain strict control of hypertension with blood pressure goal below 130/90, today's reading  124/80  Lipids with LDL cholesterol goal below 70 mg/dL. 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 Follow up yearly

## 2017-01-04 ENCOUNTER — Ambulatory Visit
Admission: RE | Admit: 2017-01-04 | Discharge: 2017-01-04 | Disposition: A | Discharge status: Home / Self Care | Payer: Medicare Other | Source: Ambulatory Visit | Attending: Nurse Practitioner | Admitting: Nurse Practitioner

## 2017-01-04 DIAGNOSIS — I671 Cerebral aneurysm, nonruptured: Secondary | ICD-10-CM | POA: Diagnosis not present

## 2017-01-04 DIAGNOSIS — I612 Nontraumatic intracerebral hemorrhage in hemisphere, unspecified: Secondary | ICD-10-CM

## 2017-01-04 DIAGNOSIS — I10 Essential (primary) hypertension: Secondary | ICD-10-CM

## 2017-01-05 ENCOUNTER — Emergency Department (HOSPITAL_COMMUNITY)
Admission: EM | Admit: 2017-01-05 | Discharge: 2017-01-05 | Disposition: A | Discharge status: Home / Self Care | Payer: Medicare Other | Attending: Emergency Medicine | Admitting: Emergency Medicine

## 2017-01-05 ENCOUNTER — Encounter (HOSPITAL_COMMUNITY): Payer: Self-pay

## 2017-01-05 ENCOUNTER — Telehealth: Payer: Self-pay | Admitting: Neurology

## 2017-01-05 DIAGNOSIS — E119 Type 2 diabetes mellitus without complications: Secondary | ICD-10-CM | POA: Diagnosis not present

## 2017-01-05 DIAGNOSIS — Z79899 Other long term (current) drug therapy: Secondary | ICD-10-CM | POA: Diagnosis not present

## 2017-01-05 DIAGNOSIS — I10 Essential (primary) hypertension: Secondary | ICD-10-CM | POA: Insufficient documentation

## 2017-01-05 DIAGNOSIS — R42 Dizziness and giddiness: Secondary | ICD-10-CM

## 2017-01-05 DIAGNOSIS — R112 Nausea with vomiting, unspecified: Secondary | ICD-10-CM | POA: Insufficient documentation

## 2017-01-05 DIAGNOSIS — Z7982 Long term (current) use of aspirin: Secondary | ICD-10-CM | POA: Diagnosis not present

## 2017-01-05 DIAGNOSIS — Z8673 Personal history of transient ischemic attack (TIA), and cerebral infarction without residual deficits: Secondary | ICD-10-CM | POA: Insufficient documentation

## 2017-01-05 LAB — BASIC METABOLIC PANEL
ANION GAP: 11 (ref 5–15)
BUN: 11 mg/dL (ref 6–20)
CALCIUM: 9.5 mg/dL (ref 8.9–10.3)
CO2: 27 mmol/L (ref 22–32)
Chloride: 101 mmol/L (ref 101–111)
Creatinine, Ser: 0.84 mg/dL (ref 0.44–1.00)
GLUCOSE: 115 mg/dL — AB (ref 65–99)
Potassium: 3.7 mmol/L (ref 3.5–5.1)
SODIUM: 139 mmol/L (ref 135–145)

## 2017-01-05 LAB — URINALYSIS, ROUTINE W REFLEX MICROSCOPIC
Bacteria, UA: NONE SEEN
Bilirubin Urine: NEGATIVE
GLUCOSE, UA: NEGATIVE mg/dL
Ketones, ur: NEGATIVE mg/dL
LEUKOCYTES UA: NEGATIVE
NITRITE: NEGATIVE
Protein, ur: NEGATIVE mg/dL
SPECIFIC GRAVITY, URINE: 1.01 (ref 1.005–1.030)
Squamous Epithelial / LPF: NONE SEEN
pH: 6 (ref 5.0–8.0)

## 2017-01-05 LAB — CBC
HCT: 37.5 % (ref 36.0–46.0)
HEMOGLOBIN: 12.2 g/dL (ref 12.0–15.0)
MCH: 28.8 pg (ref 26.0–34.0)
MCHC: 32.5 g/dL (ref 30.0–36.0)
MCV: 88.4 fL (ref 78.0–100.0)
Platelets: 262 10*3/uL (ref 150–400)
RBC: 4.24 MIL/uL (ref 3.87–5.11)
RDW: 13.7 % (ref 11.5–15.5)
WBC: 10.2 10*3/uL (ref 4.0–10.5)

## 2017-01-05 LAB — CBG MONITORING, ED: Glucose-Capillary: 111 mg/dL — ABNORMAL HIGH (ref 65–99)

## 2017-01-05 MED ORDER — MECLIZINE HCL 25 MG PO TABS
25.0000 mg | ORAL_TABLET | Freq: Once | ORAL | Status: AC
  Administered 2017-01-05: 25 mg via ORAL
  Filled 2017-01-05: qty 1

## 2017-01-05 MED ORDER — MECLIZINE HCL 25 MG PO TABS: 25.0000 mg | ORAL_TABLET | Freq: Three times a day (TID) | ORAL | 0 refills | Status: DC | PRN

## 2017-01-05 NOTE — ED Notes (Signed)
Pt ambulated in hall no problems  

## 2017-01-05 NOTE — ED Triage Notes (Signed)
Pt reports becoming dizzy while just sitting in a chair this morning at 0330. She reports feeling like she was spinning. Endorses nausea, feeling flushed. Denies chest pain, confusion, weakness. Pt reports having MRA yesterday for yearly follow up after CVA.   PT A&O x 4 at this time and is dizzy with movement

## 2017-01-05 NOTE — Telephone Encounter (Signed)
I spoke to pt and relayed that the MRA results stable.  She relayed that she was in ED this morning for acute dizziness and received the results there as well.  She is better, has taken meclizine from hospital and was sleeping.  Will call us if needed.  She verbalized understanding.

## 2017-01-05 NOTE — Telephone Encounter (Signed)
-----   Message from Dennie Bible, NP sent at 01/05/2017 12:37 PM EDT ----- Stable MRA of the brain

## 2017-01-05 NOTE — ED Provider Notes (Signed)
[facility] DEPT Provider Note   CSN: [id number] Arrival date & time: 01/05/17  0347     History   Chief Complaint Chief Complaint  Patient presents with  . Dizziness  . Nausea    HPI Tracey Richard is a 76 y.o. female.  Patient is 76 year old female who presents with dizziness. 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 She states that she was working at her computer at 2 AM this morning and had a sudden onset of dizziness which she describes as a spinning sensation. She had some nausea and dry heaves. She states her symptoms are better now but she still feels dizzy. She denies any speech deficits or vision changes. No numbness to her face. No numbness or weakness to her extremities. She's felt a little off balance to the dizziness. Incidentally, she had a screening MRI/MRA yesterday as a follow-up from her prior aneurysm.      Past Medical History:  Diagnosis Date  . Allergy   . Cancer ([place])   . Cataract   . GERD (gastroesophageal reflux disease)   . Hyperlipidemia   . Hypertension   . Stroke [facility])     Patient Active Problem List   Diagnosis Date Noted  . Aneurysm, cerebral, nonruptured 12/23/2015  . Brain aneurysm 06/25/2015  . 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 Snoring 06/16/2015  . Vivid dream 06/16/2015  . 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 Nontraumatic intracranial hemorrhage ([place]) 06/16/2015  . ICH (intracerebral hemorrhage) ([place]) 04/22/2015  . Cytotoxic brain edema ([place])   . Intracranial hemorrhage ([place]) 04/21/2015  . Falls frequently 11/14/2013  . HTN (hypertension) 11/14/2013  . Dyslipidemia 11/14/2013  . Diabetes ([place]) 11/19/2012  . 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 APPENDECTOMY  1963  . BACK SURGERY  2007  . BREAST SURGERY    . DILATION AND CURETTAGE OF UTERUS    . EYE SURGERY  1999   bilateral cataract extraction   . moles  1965  . SPINE SURGERY    . TONSILLECTOMY  1959    OB History    No data available       Home Medications    Prior to Admission medications   Medication Sig Start Date End Date Taking? Authorizing Provider  aspirin EC 81 MG tablet Take 81 mg by mouth daily.   Yes Historical Provider, MD  Carboxymethylcell-Hypromellose (GENTEAL OP) Place 1 application into both eyes 3 (three) times daily as needed (dry eyes).   Yes Historical Provider, MD  Cinnamon 500 MG capsule Take 500 mg by mouth daily.   Yes Historical Provider, MD  Esomeprazole Magnesium (NEXIUM PO) Take 20 mg by mouth daily.    Yes Historical Provider, MD  FIBER SELECT GUMMIES CHEW Chew 2 tablets by mouth daily.    Yes Historical Provider, MD  hydroxypropyl methylcellulose / hypromellose (ISOPTO TEARS / GONIOVISC) 2.5 % ophthalmic solution Place 1 drop into both eyes 4 (four) times daily as needed for dry eyes.   Yes Historical Provider, MD  imipramine (TOFRANIL) 25 MG tablet TAKE 3 TABLETS (75 MG TOTAL) BY MOUTH AT BEDTIME. 11/14/16  Yes Asencion Partridge Dohmeier, MD  OVER THE COUNTER MEDICATION Take 1 tablet by mouth daily. Calcium, magnesium, zinc, plus D3  (one daily)    Yes Historical Provider, MD  OVER THE COUNTER MEDICATION Take 1,200 mg by mouth daily. Fish oil 1200mg  -360mg  omega 3 (takes daily)  Yes Historical Provider, MD  simvastatin (ZOCOR) 40 MG tablet TAKE 1 TABLET (40 MG TOTAL) BY MOUTH DAILY AT BEDTIME 12/16/15  Yes [name], MD  valsartan (DIOVAN) 80 MG tablet TAKE ONE TABLET BY MOUTH DAILY 12/19/16  Yes Gelene Mink McVey, PA-C  vitamin C (ASCORBIC ACID) 500 MG tablet Take 1,000 mg by mouth daily.   Yes Historical Provider, MD  meclizine (ANTIVERT) 25 MG tablet Take 1 tablet (25 mg total) by mouth 3 (three) times daily as needed for dizziness. 01/05/17   Malvin Johns, MD    Family History Family History  Problem Relation Age of Onset  . Cancer Mother     vaginal cancer, also HTN  . Prostate cancer  Father     also HTN, dementia  . Diabetes Brother     Social History Social History  Substance Use Topics  . Smoking status: Never Smoker  . Smokeless tobacco: Never Used  . Alcohol use 0.6 oz/week    1 Glasses of wine per week     Comment: 1 every 6 mts      Allergies   Penicillins and Sulfur   Review of Systems Review of Systems  Constitutional: Negative for chills, diaphoresis, fatigue and fever.  HENT: Negative for congestion, rhinorrhea and sneezing.   Eyes: Negative.   Respiratory: Negative for cough, chest tightness and shortness of breath.   Cardiovascular: Negative for chest pain and leg swelling.  Gastrointestinal: Positive for nausea and vomiting. Negative for abdominal pain, blood in stool and diarrhea.  Genitourinary: Negative for difficulty urinating, flank pain, frequency and hematuria.  Musculoskeletal: Negative for arthralgias and back pain.  Skin: Negative for rash.  Neurological: Positive for dizziness. Negative for speech difficulty, weakness, numbness and headaches.     Physical Exam Updated Vital Signs BP (!) 177/90   Pulse 82   Temp 98 F (36.7 C) (Oral)   Resp (!) 23   Ht 5\' 3"  (1.6 m)   Wt 176 lb (79.8 kg)   SpO2 95%   BMI 31.18 kg/m   Physical Exam  Constitutional: She is oriented to person, place, and time. She appears well-developed and well-nourished.  HENT:  Head: Normocephalic and atraumatic.  Eyes: Pupils are equal, round, and reactive to light.  No nystagmus  Neck: Normal range of motion. Neck supple.  No meningeal symptoms  Cardiovascular: Normal rate, regular rhythm and normal heart sounds.   Pulmonary/Chest: Effort normal and breath sounds normal. No respiratory distress. She has no wheezes. She has no rales. She exhibits no tenderness.  Abdominal: Soft. Bowel sounds are normal. There is no tenderness. There is no rebound and no guarding.  Musculoskeletal: Normal range of motion. She exhibits no edema.  Lymphadenopathy:      She has no cervical adenopathy.  Neurological: She is alert and oriented to person, place, and time.  Motor 5/5 all extremities Sensation grossly intact to LT all extremities Finger to Nose intact, no pronator drift CN II-XII grossly intact    Skin: Skin is warm and dry. No rash noted.  Psychiatric: She has a normal mood and affect.     ED Treatments / Results  Labs (all labs ordered are listed, but only abnormal results are displayed) Labs Reviewed  BASIC METABOLIC PANEL - Abnormal; Notable for the following:       Result Value   Glucose, Bld 115 (*)    All other components within normal limits  URINALYSIS, ROUTINE W REFLEX MICROSCOPIC - Abnormal; Notable for the  following:    Hgb urine dipstick SMALL (*)    All other components within normal limits  CBG MONITORING, ED - Abnormal; Notable for the following:    Glucose-Capillary 111 (*)    All other components within normal limits  CBC    EKG  EKG Interpretation  Date/Time:  Friday January 05 2017 04:08:18 EDT Ventricular Rate:  78 PR Interval:  158 QRS Duration: 88 QT Interval:  396 QTC Calculation: 451 R Axis:   22 Text Interpretation:  Normal sinus rhythm Cannot rule out Anterior infarct , age undetermined Abnormal ECG since last tracing no significant change Confirmed by Slayter Moorhouse  MD, Skyla Champagne ([id number]) on 01/05/2017 7:36:05 AM       Radiology No results found.  Procedures Procedures (including critical care time)  Medications Ordered in ED Medications  meclizine (ANTIVERT) tablet 25 mg (25 mg Oral Given 01/05/17 0800)     Initial Impression / Assessment and Plan / ED Course  I have reviewed the triage vital signs and the nursing notes.  Pertinent labs & imaging results that were available during my care of the patient were reviewed by me and considered in my medical decision making (see chart for details).     Patient presents with vertigo symptoms. She was given dose of meclizine in her symptoms have  greatly improved. She is able to ambulate without ataxia. She has no other neurologic deficits. She had an MRA yesterday to evaluate the aneurysm. I spoke with Dr. Leonie Man with [facility] neurology who has reviewed the MRA and feels that there is a stable 2 mm aneurysm. Her posterior circulation is widely patent. Given her symptoms seem to be peripheral in nature, he is in agreement that an MRI is currently not indicated. She was discharged home good condition. She was advised to follow-up with Dr. Leonie Man if her symptoms continue or return here as needed for any worsening symptoms.  Final Clinical Impressions(s) / ED Diagnoses   Final diagnoses:  Vertigo    New Prescriptions New Prescriptions   MECLIZINE (ANTIVERT) 25 MG TABLET    Take 1 tablet (25 mg total) by mouth 3 (three) times daily as needed for dizziness.     Malvin Johns, MD 01/05/17 [phone]

## 2017-01-05 NOTE — Telephone Encounter (Signed)
I received a telephone call from Dr. Malvin Johns ED physician who stated patient was in ED for transient vertigo but she felt was a for vestibular in origin. Patient had MRA of the brain done on 01/04/2017 and she wanted me to look up the study. 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 No high-grade intracranial stenosis was noted.

## 2017-02-11 IMAGING — CT CT HEAD W/O CM
2 series · 15 of 30 positions shown, 19 images · non-contrast
Comparison: Head CT 11/10/2013.

CLINICAL DATA: Gait difficulty and right hand weakness for 1 day.
Mild headache. Initial encounter.

EXAM:
CT HEAD WITHOUT CONTRAST
TECHNIQUE: Contiguous axial images were obtained from the base of the skull
through the vertex without intravenous contrast.

[Series 2: head w/o · axial · non-contrast · 0.42mm/px · z∈[-89,+41]mm · 13 of 32 slices shown, 17 images]
[im 3/32  brain]
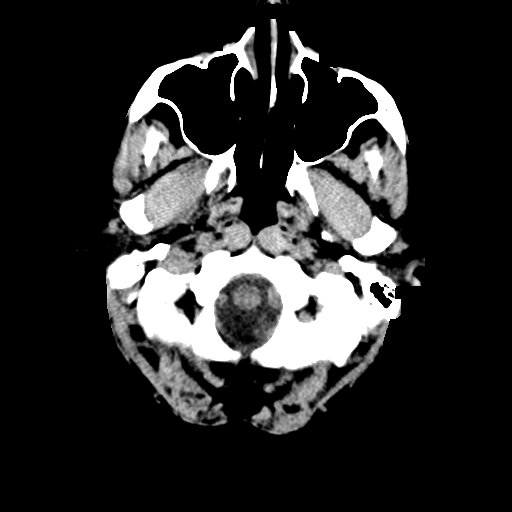
[im 3/32  bone]
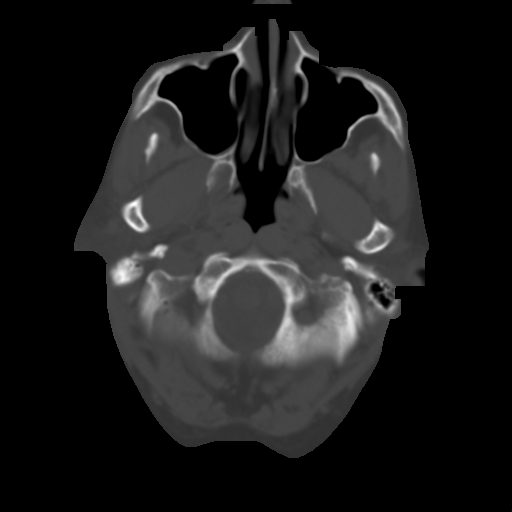
[im 5/32  brain]
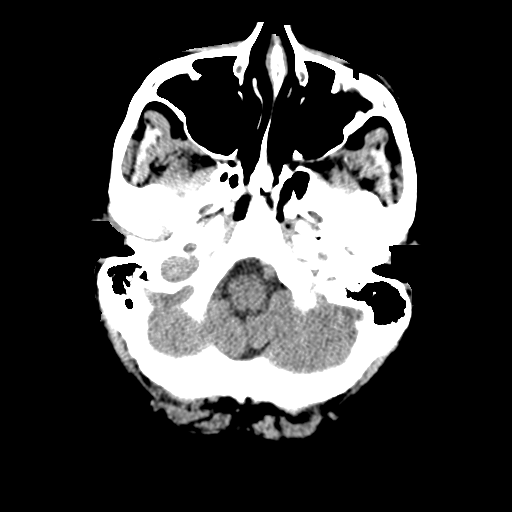
[im 7/32  brain]
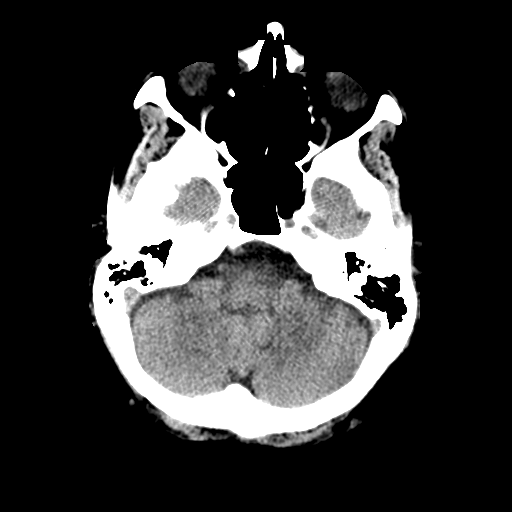
[im 9/32  brain]
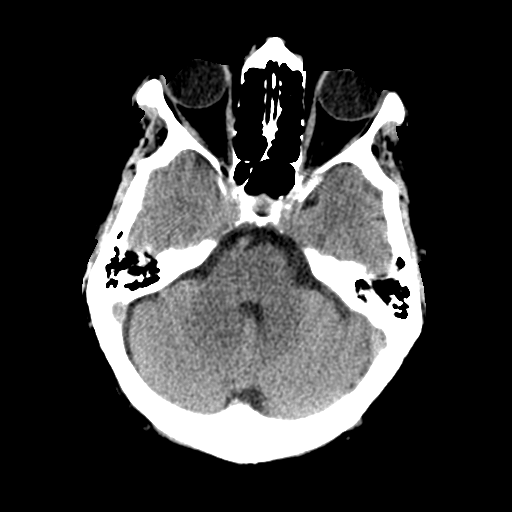
[im 12/32  brain]
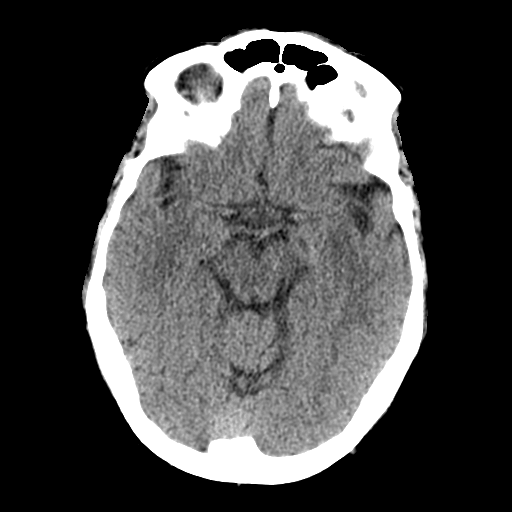
[im 12/32  bone]
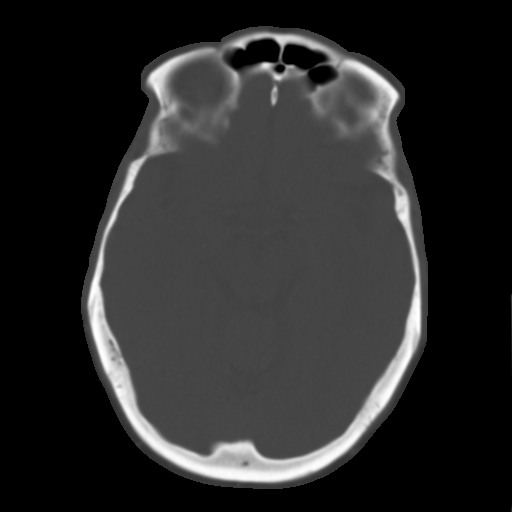
[im 14/32  brain]
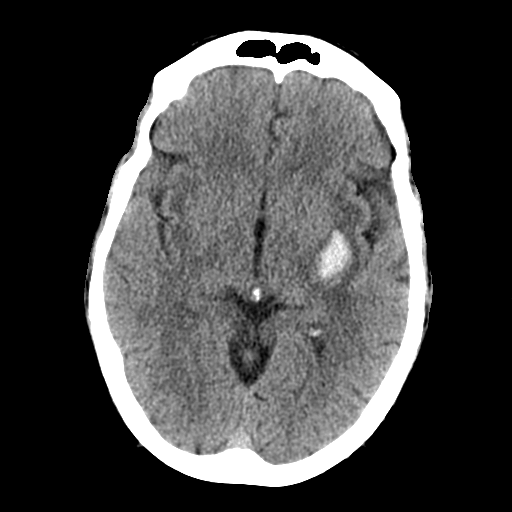
[im 16/32  brain]
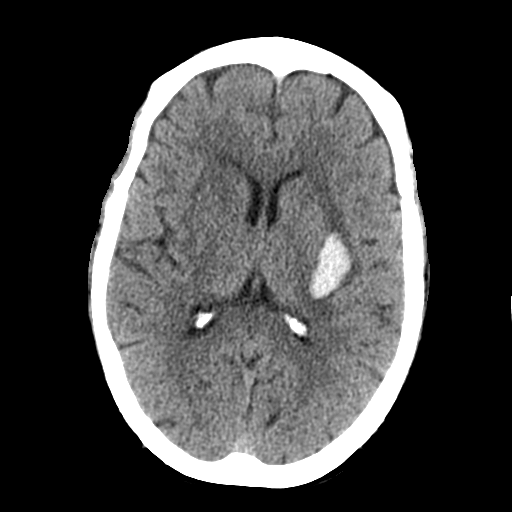
[im 18/32  brain]
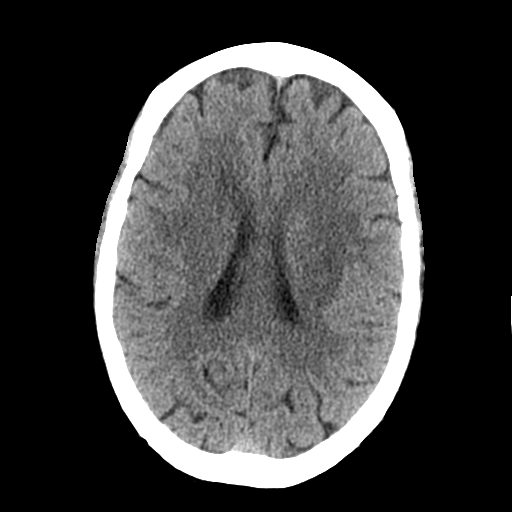
[im 20/32  brain]
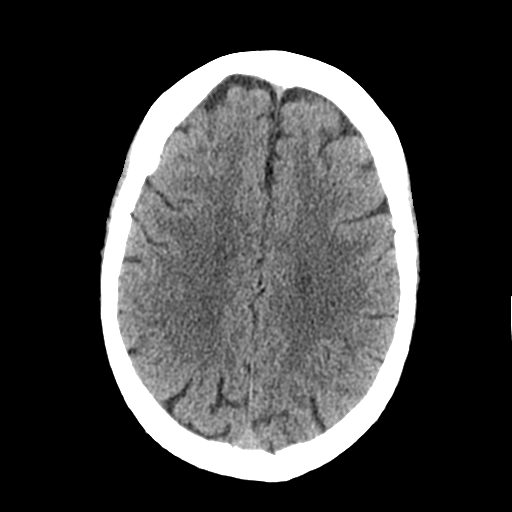
[im 20/32  bone]
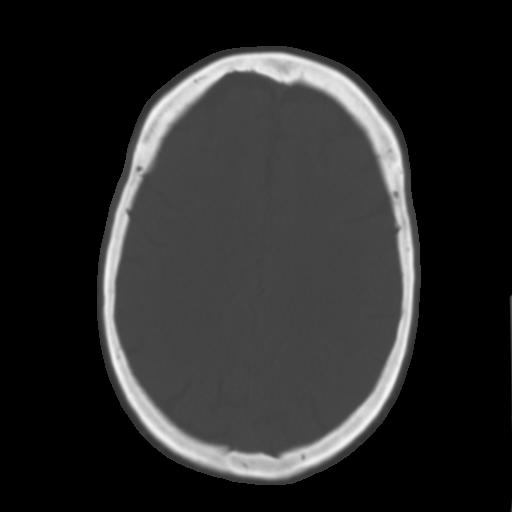
[im 23/32  brain]
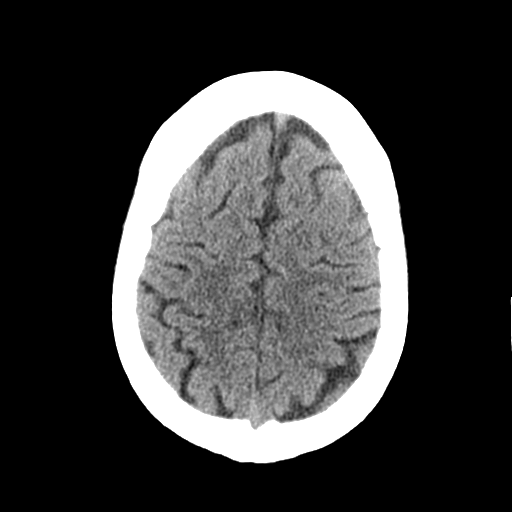
[im 25/32  brain]
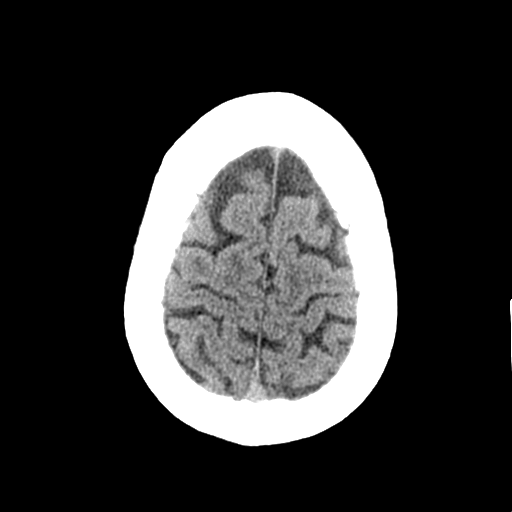
[im 27/32  brain]
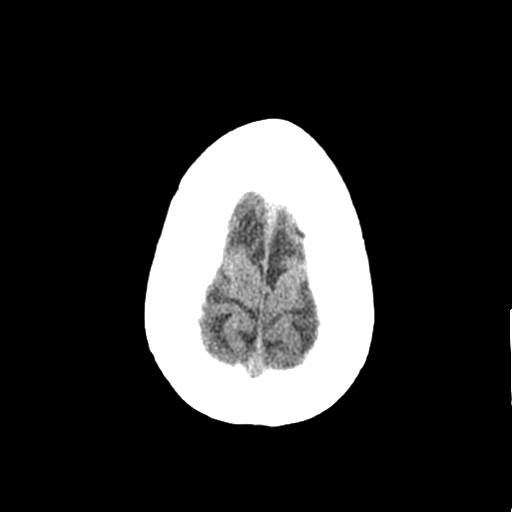
[im 29/32  brain]
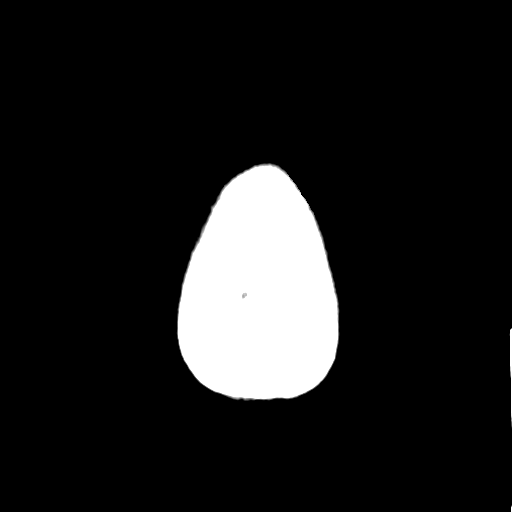
[im 29/32  bone]
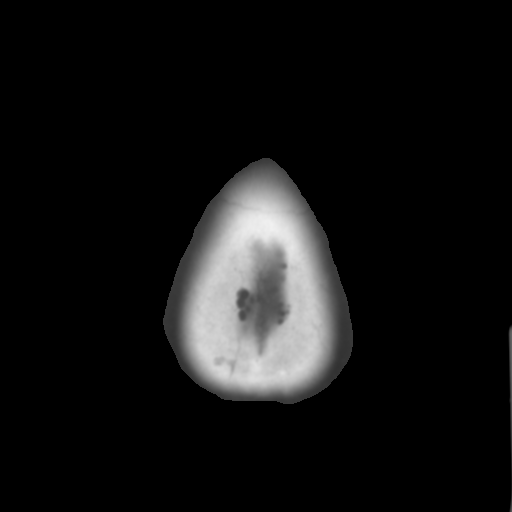

[Series 3: bone windows · axial · 0.42mm/px · z∈[-89,-69]mm · 2 of 32 slices shown]
[im 3/32  bone]
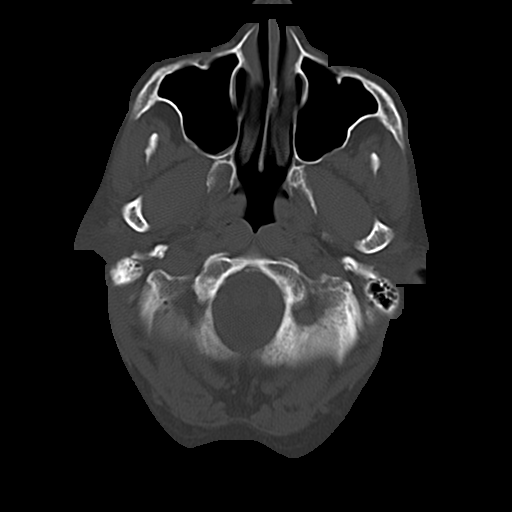
[im 7/32  bone]
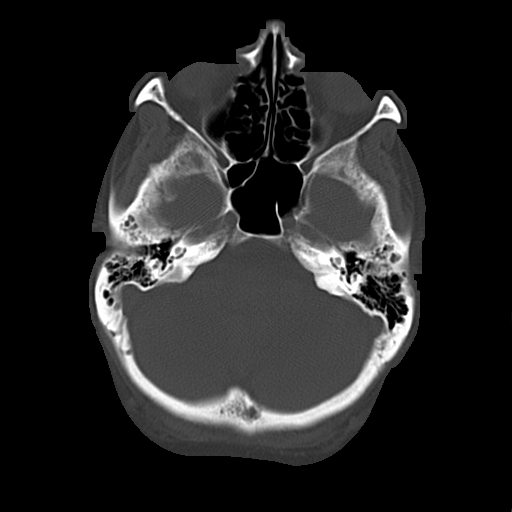

[15 of 30 positions shown; findings below may reference images not displayed]

FINDINGS: There is a 2.8 x 1.4 cm acute hematoma within the left putamen and
external capsule (image 15). This has an estimated volume of
approximately 3.9 ml. There is mild surrounding vasogenic edema. No
other evidence of acute intracranial hemorrhage, extra-axial fluid
collection, midline shift or hydrocephalus. Mild underlying small
vessel ischemic changes are present within the periventricular white
matter bilaterally.

The visualized paranasal sinuses, mastoid air cells and middle ears
are clear. The calvarium is intact.
IMPRESSION: 1. Acute hemorrhage within the left putamen and external capsule.
This may reflect a hypertensive bleed or hemorrhagic stroke.
2. No midline shift or hydrocephalus.
3. Critical Value/emergent results were called by telephone at the
time of interpretation on 04/21/2015 at [DATE] to Dr. MYNOR MCQUEEN ,
who verbally acknowledged these results.

## 2017-05-25 ENCOUNTER — Encounter (HOSPITAL_COMMUNITY): Payer: Self-pay | Admitting: Emergency Medicine

## 2017-05-25 ENCOUNTER — Emergency Department (HOSPITAL_COMMUNITY): Payer: Medicare Other

## 2017-05-25 ENCOUNTER — Emergency Department (HOSPITAL_COMMUNITY)
Admission: EM | Admit: 2017-05-25 | Discharge: 2017-05-26 | Disposition: A | Discharge status: Home / Self Care | Payer: Medicare Other | Attending: Emergency Medicine | Admitting: Emergency Medicine

## 2017-05-25 DIAGNOSIS — Y93E5 Activity, floor mopping and cleaning: Secondary | ICD-10-CM | POA: Diagnosis not present

## 2017-05-25 DIAGNOSIS — Z7982 Long term (current) use of aspirin: Secondary | ICD-10-CM | POA: Insufficient documentation

## 2017-05-25 DIAGNOSIS — E119 Type 2 diabetes mellitus without complications: Secondary | ICD-10-CM | POA: Diagnosis not present

## 2017-05-25 DIAGNOSIS — S8992XA Unspecified injury of left lower leg, initial encounter: Secondary | ICD-10-CM | POA: Diagnosis present

## 2017-05-25 DIAGNOSIS — W010XXA Fall on same level from slipping, tripping and stumbling without subsequent striking against object, initial encounter: Secondary | ICD-10-CM | POA: Diagnosis not present

## 2017-05-25 DIAGNOSIS — Y92009 Unspecified place in unspecified non-institutional (private) residence as the place of occurrence of the external cause: Secondary | ICD-10-CM | POA: Diagnosis not present

## 2017-05-25 DIAGNOSIS — Z79899 Other long term (current) drug therapy: Secondary | ICD-10-CM | POA: Diagnosis not present

## 2017-05-25 DIAGNOSIS — Y999 Unspecified external cause status: Secondary | ICD-10-CM | POA: Insufficient documentation

## 2017-05-25 DIAGNOSIS — I10 Essential (primary) hypertension: Secondary | ICD-10-CM | POA: Insufficient documentation

## 2017-05-25 DIAGNOSIS — S82142A Displaced bicondylar fracture of left tibia, initial encounter for closed fracture: Secondary | ICD-10-CM | POA: Diagnosis not present

## 2017-05-25 LAB — BASIC METABOLIC PANEL
Anion gap: 9 (ref 5–15)
BUN: 14 mg/dL (ref 6–20)
CALCIUM: 9.2 mg/dL (ref 8.9–10.3)
CHLORIDE: 106 mmol/L (ref 101–111)
CO2: 25 mmol/L (ref 22–32)
CREATININE: 1.11 mg/dL — AB (ref 0.44–1.00)
GFR calc Af Amer: 55 mL/min — ABNORMAL LOW (ref >60)
GFR calc non Af Amer: 48 mL/min — ABNORMAL LOW (ref >60)
GLUCOSE: 119 mg/dL — AB (ref 65–99)
Potassium: 4 mmol/L (ref 3.5–5.1)
Sodium: 140 mmol/L (ref 135–145)

## 2017-05-25 LAB — CBC WITH DIFFERENTIAL/PLATELET
BASOS PCT: 0 %
Basophils Absolute: 0 10*3/uL (ref 0.0–0.1)
Eosinophils Absolute: 0.1 10*3/uL (ref 0.0–0.7)
Eosinophils Relative: 0 %
HEMATOCRIT: 37.3 % (ref 36.0–46.0)
HEMOGLOBIN: 12.6 g/dL (ref 12.0–15.0)
Lymphocytes Relative: 11 %
Lymphs Abs: 1.7 10*3/uL (ref 0.7–4.0)
MCH: 29.6 pg (ref 26.0–34.0)
MCHC: 33.8 g/dL (ref 30.0–36.0)
MCV: 87.6 fL (ref 78.0–100.0)
MONO ABS: 1 10*3/uL (ref 0.1–1.0)
MONOS PCT: 7 %
NEUTROS ABS: 13 10*3/uL — AB (ref 1.7–7.7)
NEUTROS PCT: 82 %
Platelets: 255 10*3/uL (ref 150–400)
RBC: 4.26 MIL/uL (ref 3.87–5.11)
RDW: 13.5 % (ref 11.5–15.5)
WBC: 15.9 10*3/uL — ABNORMAL HIGH (ref 4.0–10.5)

## 2017-05-25 MED ORDER — OXYCODONE-ACETAMINOPHEN 5-325 MG PO TABS: 1.0000 | ORAL_TABLET | ORAL | 0 refills | Status: DC | PRN

## 2017-05-25 MED ORDER — ONDANSETRON HCL 4 MG/2ML IJ SOLN
4.0000 mg | Freq: Once | INTRAMUSCULAR | Status: AC
  Administered 2017-05-25: 4 mg via INTRAVENOUS
  Filled 2017-05-25: qty 2

## 2017-05-25 MED ORDER — MORPHINE SULFATE (PF) 4 MG/ML IV SOLN
4.0000 mg | Freq: Once | INTRAVENOUS | Status: AC
  Administered 2017-05-25: 4 mg via INTRAVENOUS
  Filled 2017-05-25: qty 1

## 2017-05-25 NOTE — Discharge Instructions (Signed)
Take percocet for breakthrough pain, do not drink alcohol, drive, care for children or do other critical tasks while taking percocet.  Do not bear any weight on the left leg. Please follow with [facility] orthopedics as soon as possible.  Return to the emergency pertinent for any new, worsening or concerning symptoms including numbness, swelling in the lower leg, leg turning pale or tingling.

## 2017-05-25 NOTE — ED Triage Notes (Signed)
Pt states she fell and landed on her left knee  Pt has swelling and bruising noted

## 2017-05-25 NOTE — ED Provider Notes (Signed)
[place] [facility] DEPT Provider Note   CSN: [id number] Arrival date & time: 05/25/17  1959     History   Chief Complaint Chief Complaint  Patient presents with  . Fall  . Knee Pain   HPI   Blood pressure 125/90, pulse 92, temperature 98.4 F (36.9 C), temperature source Oral, resp. rate 16, height 5\' 4"  (1.626 m), weight 78.9 kg (174 lb), SpO2 99 %.  Tracey Richard is a 76 y.o. female complaining of Complaining of severe left knee pain onset just prior to arrival after she slipped while cleaning out her mother's house before oxygen. The house was very dirty and hoarded, she thinks that she slipped on a bag that was on the floor. She's not anticoagulated however, she does take a 81 mg aspirin daily. There was no head trauma, LOC, cervicalgia, chest pain, abdominal pain. Initially she was able to ambulate on the knee but after driving 30 minutes to the hospital she is unable to bear weight. No pain medication taken prior to arrival. She denies any numbness, decreased sensation.Patient states that she seen a physician at [place] for an elbow injury in the past. She can't remember his name.   Past Medical History:  Diagnosis Date  . Allergy   . Cancer ([place])   . Cataract   . GERD (gastroesophageal reflux disease)   . Hyperlipidemia   . Hypertension   . Stroke [facility])     Patient Active Problem List   Diagnosis Date Noted  . Aneurysm, cerebral, nonruptured 12/23/2015  . Brain aneurysm 06/25/2015  . 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 Snoring 06/16/2015  . Vivid dream 06/16/2015  . 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 Nontraumatic intracranial hemorrhage ([place]) 06/16/2015  . ICH (intracerebral hemorrhage) ([place]) 04/22/2015  . Cytotoxic brain edema ([place])   . Intracranial hemorrhage ([place]) 04/21/2015  . Falls frequently 11/14/2013  . HTN (hypertension) 11/14/2013  . Dyslipidemia 11/14/2013  . Diabetes ([place]) 11/19/2012  . 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 APPENDECTOMY  1963  . BACK SURGERY  2007  . BREAST SURGERY    . DILATION AND CURETTAGE OF UTERUS    . EYE SURGERY  1999   bilateral cataract extraction  . moles  1965  . SPINE SURGERY    . TONSILLECTOMY  1959    OB History    No data available       Home Medications    Prior to Admission medications   Medication Sig Start Date End Date Taking? Authorizing Provider  aspirin EC 81 MG tablet Take 81 mg by mouth daily.    [provider]  Carboxymethylcell-Hypromellose (GENTEAL OP) Place 1 application into both eyes 3 (three) times daily as needed (dry eyes).    [provider]  Cinnamon 500 MG capsule Take 500 mg by mouth daily.    [provider]  Esomeprazole Magnesium (NEXIUM PO) Take 20 mg by mouth daily.     [provider]  FIBER SELECT GUMMIES CHEW Chew 2 tablets by mouth daily.     [provider]  hydroxypropyl methylcellulose / hypromellose (ISOPTO TEARS / GONIOVISC) 2.5 % ophthalmic solution Place 1 drop into both eyes 4 (four) times daily as needed for dry eyes.    [provider]  imipramine (TOFRANIL) 25 MG tablet TAKE 3 TABLETS (75 MG TOTAL) BY MOUTH AT BEDTIME. 11/14/16   Dohmeier, Asencion Partridge, MD  meclizine (ANTIVERT) 25 MG tablet Take 1  tablet (25 mg total) by mouth 3 (three) times daily as needed for dizziness. 01/05/17   Malvin Johns, MD  OVER THE COUNTER MEDICATION Take 1 tablet by mouth daily. Calcium, magnesium, zinc, plus D3  (one daily)     [provider]  OVER THE COUNTER MEDICATION Take 1,200 mg by mouth daily. Fish oil 1200mg  -360mg  omega 3 (takes daily)     [provider]  oxyCODONE-acetaminophen (PERCOCET) 5-325 MG tablet Take 1 tablet by mouth every 4 (four) hours as needed. 05/25/17   Reina Wilton, Elmyra Ricks, PA-C  simvastatin (ZOCOR) 40 MG tablet TAKE 1 TABLET (40 MG TOTAL) BY MOUTH DAILY AT BEDTIME 12/16/15   [name], MD   valsartan (DIOVAN) 80 MG tablet TAKE ONE TABLET BY MOUTH DAILY 12/19/16   McVey, Gelene Mink, PA-C  vitamin C (ASCORBIC ACID) 500 MG tablet Take 1,000 mg by mouth daily.    [provider]    Family History Family History  Problem Relation Age of Onset  . Cancer Mother        vaginal cancer, also HTN  . Prostate cancer Father        also HTN, dementia  . Diabetes Brother     Social History Social History  Substance Use Topics  . Smoking status: Never Smoker  . Smokeless tobacco: Never Used  . Alcohol use 0.6 oz/week    1 Glasses of wine per week     Comment: 1 every 6 mts      Allergies   Penicillins and Sulfur   Review of Systems Review of Systems  A complete review of systems was obtained and all systems are negative except as noted in the HPI and PMH.    Physical Exam Updated Vital Signs BP 125/90 (BP Location: Right Arm)   Pulse 92   Temp 98.4 F (36.9 C) (Oral)   Resp 16   Ht 5\' 4"  (1.626 m)   Wt 78.9 kg (174 lb)   SpO2 99%   BMI 29.87 kg/m   Physical Exam  Constitutional: She is oriented to person, place, and time. She appears well-developed and well-nourished. No distress.  HENT:  Head: Normocephalic and atraumatic.  Mouth/Throat: Oropharynx is clear and moist.  Eyes: Pupils are equal, round, and reactive to light. Conjunctivae and EOM are normal.  Neck: Normal range of motion.  Cardiovascular: Normal rate, regular rhythm and intact distal pulses.   Pulmonary/Chest: Effort normal and breath sounds normal.  Abdominal: Soft. There is no tenderness.  Musculoskeletal: She exhibits edema.  Right knee with significant effusion and ecchymosis., Reduced range of motion, no significant swelling to the calf. PT 2+. Excellent range of motion to toes, can differentiate between pinprick and light touch.  Neurological: She is alert and oriented to person, place, and time.  Skin: She is not diaphoretic.  Psychiatric: She has a normal mood and  affect.  Nursing note and vitals reviewed.    ED Treatments / Results  Labs (all labs ordered are listed, but only abnormal results are displayed) Labs Reviewed  CBC WITH DIFFERENTIAL/PLATELET - Abnormal; Notable for the following:       Result Value   WBC 15.9 (*)    Neutro Abs 13.0 (*)    All other components within normal limits  BASIC METABOLIC PANEL - Abnormal; Notable for the following:    Glucose, Bld 119 (*)    Creatinine, Ser 1.11 (*)    GFR calc non Af Amer 48 (*)    GFR  calc Af Amer 55 (*)    All other components within normal limits    EKG  EKG Interpretation None       Radiology Ct Knee Left Wo Contrast  Result Date: 05/25/2017 CLINICAL DATA:  76 year old female with fall and left knee pain. Questionable nondisplaced fracture of the tibial spine. EXAM: CT OF THE left KNEE WITHOUT CONTRAST TECHNIQUE: Multidetector CT imaging of the left knee was performed according to the standard protocol. Multiplanar CT image reconstructions were also generated. COMPARISON:  Radiograph dated 05/25/2017 FINDINGS: Bones/Joint/Cartilage Evaluation for fracture is limited due to osteopenia. There is a hairline lucency through the tibial spine (coronal series 7, image 63), corresponding to the lucency seen on the radiograph likely a vascular groove. There is an area of apparent slight cortical depression involving the anterior aspect of the tibial plateau, best seen on sagittal series 8, image 50 and coronal series 7, image 48 which is concerning for a fracture. Further evaluation with MRI is recommended. No other acute fracture identified. The bones are osteopenic. There are mild arthritic changes of the knee with joint space narrowing and spurring. There is irregularity of the posterior patellar cortex, degenerative. There is a moderate size suprapatellar effusion versus a lipohemarthrosis. No dislocation. Ligaments Suboptimally assessed by CT. Muscles and Tendons No large fluid collection  or intramuscular hematoma. Soft tissues There is contusion and hematoma in the subcutaneous soft tissues of the anterior knee. IMPRESSION: 1. Apparent focal area of cortical depression in the anterior aspect of the tibial plateau may represent an acute fracture. Further evaluation with MRI is recommended. 2. 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 3. Moderate suprapatellar effusion versus lipoma hemarthrosis. Electronically Signed   By: Anner Crete M.D.   On: 05/25/2017 23:16   Dg Knee Complete 4 Views Left  Result Date: 05/25/2017 CLINICAL DATA:  C/o anterior left knee pain and swelling. Pt states she tripped over an object at her brother's house today around 4pm and landed on her left knee. EXAM: LEFT KNEE - COMPLETE 4+ VIEW COMPARISON:  None. FINDINGS: Questionable nondisplaced fracture within the central aspects of the tibial plateau, not confirmed on all views. No displaced fracture seen. Evidence of joint effusion within the suprapatellar bursa. Prominent soft tissue swelling/edema anterior to the patella. IMPRESSION: 1. Questionable nondisplaced fracture within the central aspects of the tibial plateau. Consider CT of the left knee for definitive characterization. 2. Joint effusion. 3. Prominent soft tissue swelling/edema overlying the patella. Electronically Signed   By: Franki Cabot M.D.   On: 05/25/2017 20:49    Procedures Procedures (including critical care time)  Medications Ordered in ED Medications  morphine 4 MG/ML injection 4 mg (4 mg Intravenous Given 05/25/17 2249)  ondansetron (ZOFRAN) injection 4 mg (4 mg Intravenous Given 05/25/17 2249)     Initial Impression / Assessment and Plan / ED Course  I have reviewed the triage vital signs and the nursing notes.  Pertinent labs & imaging results that were available during my care of the patient were reviewed by me and considered in my medical decision making (see chart for details).     Vitals:   05/25/17  2013  BP: 125/90  Pulse: 92  Resp: 16  Temp: 98.4 F (36.9 C)  TempSrc: Oral  SpO2: 99%  Weight: 78.9 kg (174 lb)  Height: 5\' 4"  (1.626 m)    Medications  morphine 4 MG/ML injection 4 mg (4 mg Intravenous Given 05/25/17 2249)  ondansetron (ZOFRAN) injection 4  mg (4 mg Intravenous Given 05/25/17 2249)    KAEDYN BELARDO is 76 y.o. female presenting with Severe left knee pain status post mechanical fall, no other trauma. Neurovascularly intact with severe swelling. Compartments are soft however. X-ray of knee with a questionable nondisplaced tibial plateau fracture. CT pending. Patient states last oral intake was at 1:30 PM, advised her to remain nothing by mouth.  CT with likely cortical depression on the anterior aspect of the tibial plateau.  Orthopedic consult from Dr. Elmyra Ricks appreciated: Tracey Richard discussed the CT results, he agrees with the plan of knee immobilizer, crutches and outpatient follow-up. Discussed return precautions with patient, she verbalized understanding and teach back technique.  Discussed case with attending physician who agrees with care plan and disposition.   Evaluation does not show pathology that would require ongoing emergent intervention or inpatient treatment. Pt is hemodynamically stable and mentating appropriately. Discussed findings and plan with patient/guardian, who agrees with care plan. All questions answered. Return precautions discussed and outpatient follow up given.    Final Clinical Impressions(s) / ED Diagnoses   Final diagnoses:  Closed fracture of left tibial plateau, initial encounter    New Prescriptions New Prescriptions   OXYCODONE-ACETAMINOPHEN (PERCOCET) 5-325 MG TABLET    Take 1 tablet by mouth every 4 (four) hours as needed.     Prudy Candy, Charna Elizabeth 05/26/17 0000    Davonna Belling, MD 05/26/17 [phone]

## 2017-05-25 NOTE — ED Notes (Signed)
Bed: [facility]1 Expected date:  Expected time:  Means of arrival:  Comments: 

## 2017-05-25 NOTE — ED Notes (Signed)
Pt fell today and is c/o 9/10 pain to the left knee with swelling. Pt takes 81mg  aspirin daily.

## 2017-05-25 NOTE — ED Notes (Signed)
Bed: HH83 Expected date: 05/25/17 Expected time: 8:04 PM Means of arrival:  Comments: EMS fall 76 year old

## 2017-07-23 ENCOUNTER — Telehealth: Payer: Self-pay | Admitting: Nurse Practitioner

## 2017-07-23 ENCOUNTER — Telehealth: Payer: Self-pay | Admitting: Neurology

## 2017-07-23 NOTE — Telephone Encounter (Signed)
I spoke to pt and let her know that she needs to call orthoped and see what his recommendation is.  He may want her to stay on 325mg  aspirin for a short period and then go back to 81mg  daily.  She will call and see what he says.

## 2017-07-23 NOTE — Telephone Encounter (Signed)
Pt states that Dr Leonie Man originally put her on aspirin EC 81 MG tablet Pt states when she fractured her ankle on 8-3 her orthopedic Dr told her to take 2  325 mg of aspirin.  Before he released her he told her to go back to 1 325mg  of aspirin.  Pt is asking for a call back to know if it is okay to continue taking the 1 325mg  of aspirin a day or if she should go back to 81mg  of aspirin, please call

## 2017-07-27 NOTE — Telephone Encounter (Signed)
ERROR

## 2017-11-14 ENCOUNTER — Encounter: Payer: Self-pay | Admitting: Neurology

## 2017-11-14 ENCOUNTER — Other Ambulatory Visit: Payer: Self-pay | Admitting: Neurology

## 2017-11-14 ENCOUNTER — Ambulatory Visit: Payer: Medicare Other | Admitting: Neurology

## 2017-11-14 VITALS — BP 116/69 | HR 73 | Ht 64.0 in | Wt 173.5 lb

## 2017-11-14 DIAGNOSIS — I671 Cerebral aneurysm, nonruptured: Secondary | ICD-10-CM

## 2017-11-14 DIAGNOSIS — Z9114 Patient's other noncompliance with medication regimen: Secondary | ICD-10-CM | POA: Diagnosis not present

## 2017-11-14 DIAGNOSIS — G4709 Other insomnia: Secondary | ICD-10-CM

## 2017-11-14 DIAGNOSIS — E1140 Type 2 diabetes mellitus with diabetic neuropathy, unspecified: Secondary | ICD-10-CM | POA: Diagnosis not present

## 2017-11-14 DIAGNOSIS — Z9989 Dependence on other enabling machines and devices: Secondary | ICD-10-CM

## 2017-11-14 DIAGNOSIS — G4733 Obstructive sleep apnea (adult) (pediatric): Secondary | ICD-10-CM | POA: Diagnosis not present

## 2017-11-14 NOTE — Progress Notes (Signed)
SLEEP MEDICINE CLINIC   Provider:  [name], M D  Referring Provider: Antony Contras, MD Primary Care Physician:  Antony Contras, MD  Chief Complaint  Patient presents with  . Follow-up    CPAP follow up. Patient feels that her machine is not working well for her.     HPI: Tracey Richard states that lifelong she has been a good sleeper. She always has been easy to go to sleep and stay asleep, and that she requires 12-14 hours of sleep easily. She may still take naps, lasting several hours. She remembers being a sleepy teenager. She married at age 72 - and her husband mentioned to her thatshe spneds more time in bed than anyone he ever knew. She would wake up with headaches. This Summer she went with friends to the [place] area by car and she slept throughout the trip,  she does not remember seeing the [place]!  She sleeps for many years on 3 tabs of imipramine, originally, decades ago , prescribed for headaches and pain (?).  She was hospitalized for possible stroke-TIA on 04/21/2015 and during the observation. Nurses had witnessed her not just to snore but to stop breathing intermittently. The patient then saw her primary care physician, Dr. Ivar Bury. His note from 05-07-15 reflects that the patient had a hemorrhagic stroke and therefore is not taking any baby aspirin she had a very mild headache no gait abnormality no motor abnormality since a stroke. She usually has well-controlled hypertension and diabetes he mentioned. The stroke affected the left basal ganglia and was occurring on 6-29. 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  Sleep habits are as follows: since her retirement in June 2011 she goes to bed later , watches more TV and  works late at the computer  screen.  She does not have an established bedtime but her habit is to go to bed at 3-4 Am- and will sleep for at least 8 hours. She reports that she has vivid dreams every night, often if she wakes out off dream stages she will go back to bed and continues the same story line. She does not report any sleep paralysis but dream intrusion. She is falling asleep very easily when not stimulated or physically active. Yesterday she took a 20 minute power nap and this refresh as her. During her nocturnal sleep she does not have to go to the bathroom or rarely. Her gentleman friend states that she does snore but he has not noted her to have apnea or stop to breathe. She is also not restlessly tossing and turning. He has noted her to twitch or kick at night. She denies any irresistible urge to move before bedtime or before going to sleep. Her boyfriend is a early bird , but he gets a usually sleep until 8 or 9 AM at least. This especially when that does travel together. She drinks coffee in the morning about 2 cups, she rarely drinks iced tea or sodas and not in the afternoon.  Sleep medical history and family sleep history:  She does not recall hearing her parents or siblings snoring. Social history:  HS, lives with a female friend, divorced,  No biological children.   Interval history from 11/16/2015; Tracey Richard had extensive new medical events happening between her last visit with me and  today. She was admitted on June 27 with a possible stroke, Dr. said his note is to be seen above. I had ordered a sleep study for the patient in the meantime she had suffered a stroke. The date of recording for her polysomnography was 07-14-15. The study documented mild sleep apnea with an AHI of 7.5 in supine  AHI of 13. She did have an oxygen nadir at 87% saturation and 17.8 minutes of desaturation. She had significant sleep disruption from periodic limb movements twitching at night to the level that it aroused her  from sleep. This index was 8.0 -I suggested that the patient should undergo treatment for the restless legs and  I will order a CPAP titration allowing the patient to get used to CPAP therapy before we may reschedule an MS LT - depending on her degree of sleepiness.  The date of the baseline sleep study she endorsed the Epworth score at 17 points. She was titrated to CPAP she did not feel the need to continue restless leg medication. She was titrated to CPAP of 12 cm water and started on an auto CPAP between 5 and 15 with a nasal pillow mask. In the meantime she has changed to a full face mask feeling that this is more comfortable for her. She does not have issues with claustrophobia. Her Epworth score has decreased to only 11 points, her hypertension is well treated and controlled at this point. Her fatigue score was endorsed at only 14 points. Reactive depression score at 0 pointsThe patient has been 100% compliant with CPAP use and over 90% compliance with the consecutive 4 hour use. Average user time daily 7 hours and 37 minutes. She is using the AutoSet between 5 and 15 with a 3 cm EPR, the 95th percentile pressure now is 14.2 cm water. The residual AHI is 4.3 which is a reduction of about 50%. She feels she sleeps well she is restored and refreshed in the morning and also she did not have a high degree of apnea the treatment of apnea seems to have reduced her periodic limb movements. There is no longer any evidence of restless legs or periodic limb movements interrupting her sleep. The patient will continue to use the AutoSet at the current settings. The interface has not bothered her at all and the change to a full face mask did not lead to major air leaks. The nasal pillows caused nasal blisters.  Tracey Richard is a 77 year old Caucasian lady seen today for first office follow-up visit following admission for intracerebral hemorrhage in June 2016.Tracey Richard is a 77 y.o. female who reports that  yesterday she awakened normal. Went for a walk with her friend and noted that she was Walking as if drunk. She was going to the right. She was taken back home and went to bed early which is unusual for her. Awakened this afternoon and her gait was much better but was noted to drop the mail from her right hand. Also noted when she went out to dinner that she was dropping her fork. Presented for evaluation at that time. Patient is right handed.  Date last known well: Date: 04/19/2015 Time last known well: Time: 16:30 tPA Given: No: ICH.Marland Kitchen She was admitted to the intensive care unit where she underwent close neurological monitoring as well as tight blood pressure control. Her blood pressure was not particularly elevated and was he controlled. She was cleared by speech therapy and started on no medications. MRI scan was obtained  later on the day of admission and confirmed left basal ganglia hemorrhage without any underlying lesion. There is no significant mass effect, midline shift or intraventricular extension. Patient underwent echocardiogram, carotid Doppler both of which were normal. She had only minimal weakness of the right hand muscles which was improving at the time of discharge. She was seen by physical occupational speech therapy and found to have no therapy needs and was felt stable to be discharged home . She has done well and has not had any recurrent neurological symptoms. She states her blood pressure is well controlled. She in fact traveled to [place] to attend her granddaughter's wedding. She is independent in all activities of daily living. She is very active. She admits to mild short-term memory difficulties which she states these existed even prior to her hemorrhage and are not any worse now. 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 Update 12/23/2015 PS: She returns for follow-up after last visit 6  months ago. She continues to do well without recurrent stroke or TIA symptoms. She's had only one episode of minor headache which she rated as 1 out of 10 and did not even need medication. She had a follow-up MRI scan of the brain on 07/11/15 which I personally reviewed and shows satisfactory resolution of the basal ganglia hemorrhage. 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 Patient is doing extremely well with no physical deficits from a brain hemorrhage. She's noticed some occasional short-term memory difficulties but these are not progressive or bothersome. She states her blood pressure is well controlled and today it is 117/99. She's tolerating Zocor well without muscle aches and pains. She has no complaints today.  Interval history from 11/14/2016, I have the pleasure of seeing Tracey Richard today who has been highly compliant CPAP user and has used to machine 25 of the last 30 days over 4 hours compliance is 83%, average user time is 7 hours 4 minutes, the residual AHI is 3.1. She's using an AutoSet between 5 and 15 cm water with 3 cm expiratory pressure relief. She feels well using CPAP sleeping well, sounder and she still using imipramine which helps helped her to feel less stiff has less joint pain and also sleep somewhat better. She endorsed no other symptoms today a geographic depression score was rated at 0 points Epworth sleepiness score at 6 points and fatigue severity score was not evaluated.   11-14-2017 Chief complaint according to patient :"  I may have apnea, had a stroke "   Tracey Richard is a 77 y.o. female , seen here as a patient of Dr. Clydene Fake and Dr. Laqueta Linden.  Mr. Cundy has spent many nights recently in the hospital at her niece's side who coded after developing a bacterial occlusion in her leg, undergone 2 partial amputations.  Now she has a bedsore and she is diabetic at age 72.  It has been difficult for Tracey Richard to use CPAP and she  states that she struggles with the mask every night her fatigue score is 24, her Epworth sleepiness score is only 10 points, I would like to say that the CPAP seems to work for her but she states that she is so sleep deprived after a night with CPAP that she will take a prolonged nap in Daytime.  What bothers her the most is that air leaks into her eyes irritating.  She  has been using the CPAP 29 out of 30 days but often could not reach the 4-hour mark average use of time however is 5 hours and 1 minute compliance for time is 53%, she is using an AutoSet between 5 and 15 cm water with 3 cm EPR, her AHI is 3.5 which is a significant reduction over her mild apnea index during her sleep study.  She has still some residual obstructive sleep apneas.  Air leak is really high at 95% equal to 37.7 L/min a minute the 95th percentile pressure is 12 cmH2O and is fully covered. He is currently using a Full face mask that covers her face while she sleeps with her mouth open. She tried a chin strap and developed drooling.  This has been a shift in comparison to her experience in the year 2016 and 17 when she did extremely well with CPAP in spite of the otherwise well the mild apnea. I want to obtain a home sleep test - Watch [facility] - with the goal to establish if she still has sleep apnea.  One reason that she may not tolerate CPAP as well is that she may not be a need off it.  Review of Systems:  Out of a complete 14 system review, the patient complains of only the following symptoms, and all other reviewed systems are negative. Epworth score 10  from 17 pre CPAP  , Fatigue severity score 24  from  48  , depression score  3/15    Social History   Socioeconomic History  . Marital status: Single    Spouse name: Not on file  . Number of children: Not on file  . Years of education: Not on file  . Highest education level: Not on file  Social Needs  . Financial resource strain: Not on file  . Food insecurity - worry:  Not on file  . Food insecurity - inability: Not on file  . Transportation needs - medical: Not on file  . Transportation needs - non-medical: Not on file  Occupational History  . Occupation: retired  Tobacco Use  . Smoking status: Never Smoker  . Smokeless tobacco: Never Used  Substance and Sexual Activity  . Alcohol use: Yes    Alcohol/week: 0.6 oz    Types: 1 Glasses of wine per week    Comment: 1 every 6 mts   . Drug use: No  . Sexual activity: No  Other Topics Concern  . Not on file  Social History Narrative   Walks daily. Divorced. Education: [employer].      Drinks 4 cups of caffeine daily.    Family History  Problem Relation Age of Onset  . Cancer Mother        vaginal cancer, also HTN  . Prostate cancer Father        also HTN, dementia  . Diabetes Brother     Past Medical History:  Diagnosis Date  . Allergy   . Cancer ([place])   . Cataract   . GERD (gastroesophageal reflux disease)   . Hyperlipidemia   . Hypertension   . Stroke [facility])     Past Surgical History:  Procedure Laterality Date  . APPENDECTOMY  1963  . BACK SURGERY  2007  . BREAST SURGERY    . DILATION AND CURETTAGE OF UTERUS    . EYE SURGERY  1999   bilateral cataract extraction  . moles  1965  . SPINE SURGERY    . TONSILLECTOMY  1959  Current Outpatient Medications  Medication Sig Dispense Refill  . aspirin EC 81 MG tablet Take 81 mg by mouth daily.    . Cinnamon 500 MG capsule Take 500 mg by mouth daily.    . Esomeprazole Magnesium (NEXIUM PO) Take 20 mg by mouth daily.     Marland Kitchen imipramine (TOFRANIL) 25 MG tablet TAKE 3 TABLETS (75 MG TOTAL) BY MOUTH AT BEDTIME. 270 tablet 3  . losartan (COZAAR) 50 MG tablet Take 1 tablet by mouth at bedtime.    Marland Kitchen OVER THE COUNTER MEDICATION Take 1 tablet by mouth daily. Calcium, magnesium, zinc, plus D3  (one daily)     . OVER THE COUNTER MEDICATION Take 1,200 mg by mouth daily. Fish oil 1200mg  -360mg  omega 3 (takes daily)     . simvastatin (ZOCOR)  40 MG tablet TAKE 1 TABLET (40 MG TOTAL) BY MOUTH DAILY AT BEDTIME 90 tablet 3  . vitamin C (ASCORBIC ACID) 500 MG tablet Take 1,000 mg by mouth daily.     No current facility-administered medications for this visit.     Allergies as of 11/14/2017 - Review Complete 11/14/2017  Allergen Reaction Noted  . Penicillins Other (See Comments) 06/21/2011  . Sulfur Other (See Comments) 06/21/2011    Vitals: BP 116/69   Pulse 73   Ht 5\' 4"  (1.626 m)   Wt 173 lb 8 oz (78.7 kg)   BMI 29.78 kg/m  Last Weight:  Wt Readings from Last 1 Encounters:  11/14/17 173 lb 8 oz (78.7 kg)   HAL:PFXT mass index is 29.78 kg/m.     Last Height:   Ht Readings from Last 1 Encounters:  11/14/17 5\' 4"  (1.626 m)    Physical exam:  General: The patient is awake, alert and appears worried- she is tearful while reporting about her nieces health.  The patient is well groomed. Head: Normocephalic, atraumatic.  Neck is supple. Mallampati 3  neck circumference: 15.5 . Nasal airflow unrestricted, TMJ is not evident . Retrognathia is not seen.  Cardiovascular:  Regular rate and rhythm. Respiratory: Lungs are clear to auscultation. Skin:  Without evidence of edema, or rash.  Trunk: BMI is 29.8. The patient's posture is mildly stooped.  Neurologic exam : The patient is awake and alert, oriented to place and time.  Memory subjective described as intact, she reports some delayed word finding. Speech is fluent without dysarthria or aphasia. Mood and affect are appropriate.  Cranial nerves:Pupils are equal, ciliary injection . Extraocular movements  in vertical and horizontal planes intact and without nystagmus. Visual fields by finger perimetry are intact. Hearing to finger rub intact. Facial sensation intact to fine touch.Facial motor strength is symmetric and tongue and uvula move midline. Shoulder shrug was symmetrical.  No sensory loss to primary modalities such as fine touch, pinprick, vibration and temperature.   There is no ankle edema. Motor exam: Normal tone, muscle bulk and symmetric strength in all extremities. She fractured the left knee last year, and has healed well.  Deep tendon reflexes: in the upper and lower extremities are symmetric/ intact. Achilles tendon reflex attenuated.  No clonus. Babinski maneuver deferred   The patient was advised of the nature of the diagnosed sleep disorder, the treatment options and risks for general a health and wellness arising from not treating the condition.  I spent more than 30  minutes of face to face time with the patient.  Greater than 50% of time was spent in counseling and coordination of care. We have discussed  the diagnosis and differential and I answered the patient's questions.   The patient used to sleep well on 75 mg of protamine has reduced the dose to 25.  Her sleep has not been as sound and she struggles now with her CPAP, there are more air leaks under a full facemask, some of these air leaks have started to irritate her eyes, and she just does not feel as if she gets enough sleep she struggles works against the machine.  Since her underlying apnea was really mild she will order a home sleep test for her just to confirm if apnea is still present or not may be she can get off her CPAP at this time.  Assessment:  After physical and neurologic examination, review of  Imaging studies ,  Results of polysomnography/ neurophysiology testing and pre-existing records as far as provided in visit., my assessment is   1) Stroke Ms Roane is a 77 year old Caucasian female patient with intracerebral hemorrhage in June 2016. Patient is right handed.    2)  OSA, snoring and hypersomnia. Vivid dreams- but poor sleep habits.!  Improved drastically after her overall mild sleep apnea was treated with CPAP, she could discontinue the  PLM medication and desn't need Modafinil now.  She was 90% compliant with CPAP, now using a FFM and has not longer uncontrolled  HTN, her PLMs resolved.  Now she has relapsed into poor sleep, restlessless and struggles with CPAP use. I wonder if the   reduction in imipramine on her  own account to 25 from 75 mg had something to do with it.   Plan:  Treatment plan and additional workup : Hold the auto CPAP 5-15 cm water and await  The HST results.  . Off ASA 81 mg per Dr Leonie Man. Imipramine- on reduced dose 25 mg will increase to  2 tabs a day -  patient reports it helps her sleep and she is less stiff and achy in AM . RV in 4 -8 weeks with Np to review HST and CPAP needs after returning to imipramine at 50 mg.    Asencion Partridge Calli Bashor MD  11/14/2017   CC: Antony Contras, [address] [place], [place] [id number]

## 2017-11-28 ENCOUNTER — Ambulatory Visit (INDEPENDENT_AMBULATORY_CARE_PROVIDER_SITE_OTHER): Payer: Medicare Other | Admitting: Neurology

## 2017-11-28 DIAGNOSIS — I671 Cerebral aneurysm, nonruptured: Secondary | ICD-10-CM

## 2017-11-28 DIAGNOSIS — G4733 Obstructive sleep apnea (adult) (pediatric): Secondary | ICD-10-CM

## 2017-11-28 DIAGNOSIS — E1140 Type 2 diabetes mellitus with diabetic neuropathy, unspecified: Secondary | ICD-10-CM

## 2017-11-28 DIAGNOSIS — Z9114 Patient's other noncompliance with medication regimen: Secondary | ICD-10-CM

## 2017-11-28 DIAGNOSIS — Z9989 Dependence on other enabling machines and devices: Secondary | ICD-10-CM

## 2017-11-28 DIAGNOSIS — G4709 Other insomnia: Secondary | ICD-10-CM

## 2017-12-17 ENCOUNTER — Other Ambulatory Visit: Payer: Self-pay | Admitting: Neurology

## 2017-12-17 DIAGNOSIS — G4733 Obstructive sleep apnea (adult) (pediatric): Secondary | ICD-10-CM

## 2017-12-17 DIAGNOSIS — G4710 Hypersomnia, unspecified: Secondary | ICD-10-CM

## 2017-12-17 DIAGNOSIS — Z9989 Dependence on other enabling machines and devices: Principal | ICD-10-CM

## 2017-12-17 NOTE — Procedures (Signed)
NAME:  Tracey Richard                                                        DOB: 05-19-1941 MEDICAL RECORD NUMBER [id number]                                             DOS: 12/12/2017 REFERRING PHYSICIAN: Antony Contras, M.D. STUDY PERFORMED: Home Sleep Test on Watch pat HISTORY:  [name] states that l she has been a good sleeper, it has always been easy to go to sleep and stay asleep, and that she requires 12-14 hours of sleep easily. She may still take naps, lasting several hours. She remembers being a sleepy teenager. She married at age 73 - and her husband mentioned to her that she spends more time in bed than anyone he ever knew. She would wake up with headaches. In Summer 2018 she went with friends to the [place] area and she slept throughout the trip, she does not remember seeing the [place]!  She was hospitalized for stroke on 04/21/2015 and her nurses had witnessed her to snore and to stop breathing intermittently. She has had a sleep study on 07-14-2015 and started on CPAP.  Epworth 10 on CPAP and 17 before. BMI: 29.7.  The patient's primary care physician at that time, Dr. Ivar Bury, noted on 05-07-15 that the patient had a hemorrhagic stroke - she had a very mild headache, no gait abnormality, no speech abnormality since. She has well-controlled hypertension and diabetes.   STUDY RESULTS:  Total Recording Time: 7 hours, 19 minutes. Total Apnea/Hypopnea Index (AHI): 27.8/hr., RDI was 30.8 /hr. Average Oxygen Saturation: 93 %, Lowest Oxygen Desaturation: 83 %  Total Time in Oxygen Saturation below 89% was 1.8 minutes.  Average Heart Rate:   67 bpm (sinus rhythm between 52 and 120 bpm). IMPRESSION: Moderate obstructive sleep apnea is still present, but only minimal hypoxia is associated. RECOMMENDATION: I recommend to continue CPAP treatment, if possible with CPAP auto titration between  5-15 cm water pressure and with 3 cm EPR.  Mask of patient's choice.  I certify that I have  reviewed the raw data recording prior to the issuance of this report in accordance with the standards of the American Academy of Sleep Medicine (AASM).  [name], M.D.     12-17-2017  Medical Director of [facility] at [facility] [place] of the ABPN and ABSM and accredited by the AASM

## 2017-12-18 ENCOUNTER — Telehealth: Payer: Self-pay | Admitting: Neurology

## 2017-12-18 NOTE — Telephone Encounter (Signed)
Called the patient and discussed her sleep study results with her. I informed her of the sleep study still shows moderate sleep apnea. The patient has struggled with the pressure and the mask. I have instructed the patient reach out to [facility] and get them to re fit her mask. I will send orders for the auto titration setting and if her machine has that capability we will change to that.Pt verbalized understanding.

## 2017-12-18 NOTE — Telephone Encounter (Signed)
-----   Message from [name], MD sent at 12/17/2017  9:50 AM EST ----- Patient still needs CPAP - there is moderately severe obstructive sleep apnea present.  She should continue CPAP use. If the patient is needing a new machine, will order auto-titration capable machine 5-15 cm water, 3 cm EPR , heated humidity and mask of choice.  Tracey Richard, please ask if she is due for a new machine, or if her old one broke... She reported high air leaks , bothering her and needs to be refitted for a mask in any case- with new machine or not.

## 2017-12-26 ENCOUNTER — Encounter: Payer: Self-pay | Admitting: Nurse Practitioner

## 2017-12-26 NOTE — Progress Notes (Signed)
[facility]  PATIENT: REDELL BHANDARI DOB: 1940/11/01   REASON FOR VISIT: Follow-up for obstructive sleep apnea with CPAP HISTORY FROM:    HISTORY OF PRESENT ILLNESS:UPDATE 3/7/2019CM Ms. Gambino, 77 year old female returns for follow-up with history of intracerebral hemorrhage in 2016.  She is currently on aspirin 0.81 daily without bruising or bleeding.  Most recent MRA of the brain 01/04/2017 was stable.  Blood pressure in the office today well controlled at 126/72.  She remains on Zocor without myalgias.  She also has a history of obstructive sleep apnea with CPAP.  She is here for compliance.  She explains that her compliance is poor because her niece was in the hospital for 86 days most of that time in the intensive care unit and she stayed at the hospital many nights.  Her CPAP data dated 10/28/2017-12/26/2017 shows greater than 4 hours usage of 52%.  Average usage 6 hours 20 minutes pressure 5-15 cm EPR level 3 AHI 3.2.  She returns for reevaluation  10/2317 CD/Mrs. Creson states that lifelong she has been a good sleeper. She always has been easy to go to sleep and stay asleep, and that she requires 12-14 hours of sleep easily. She may still take naps, lasting several hours. She remembers being a sleepy teenager. She married at age 38 - and her husband mentioned to her thatshe spneds more time in bed than anyone he ever knew. She would wake up with headaches. This Summer she went with friends to the [place] area by car and she slept throughout the trip,  she does not remember seeing the [place]!  She sleeps for many years on 3 tabs of imipramine, originally, decades ago , prescribed for headaches and pain (?).  She was hospitalized for possible stroke-TIA on 04/21/2015 and during the observation. Nurses had witnessed her not just to snore but to stop breathing intermittently. The patient then saw her primary care physician, Dr. Ivar Bury. His note from 05-07-15  reflects that the patient had a hemorrhagic stroke and therefore is not taking any baby aspirin she had a very mild headache no gait abnormality no motor abnormality since a stroke. She usually has well-controlled hypertension and diabetes he mentioned. The stroke affected the left basal ganglia and was occurring on 6-29. 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  Sleep habits are as follows: since her retirement in June 2011 she goes to bed later , watches more TV and  works late at the computer screen.  She does not have an established bedtime but her habit is to go to bed at 3-4 Am- and will sleep for at least 8 hours. She reports that she has vivid dreams every night, often if she wakes out off dream stages she will go back to bed and continues the same story line. She does not report any sleep paralysis but dream intrusion. She is falling asleep very easily when not stimulated or physically active. Yesterday she took a 20 minute power nap and this refresh as her. During her nocturnal sleep she does not have to go to the bathroom or rarely. Her gentleman friend states that she does snore but he has not noted her to have apnea or stop to breathe. She is also not restlessly tossing and turning. He has noted her to twitch or kick  at night. She denies any irresistible urge to move before bedtime or before going to sleep. Her boyfriend is a early bird , but he gets a usually sleep until 8 or 9 AM at least. This especially when that does travel together. She drinks coffee in the morning about 2 cups, she rarely drinks iced tea or sodas and not in the afternoon. Interval history from 11/14/2016, I have the pleasure of seeing Mrs. Spindler today who has been highly compliant CPAP user and has used  to machine 25 of the last 30 days over 4 hours compliance is 83%, average user time is 7 hours 4 minutes, the residual AHI is 3.1. She's using an AutoSet between 5 and 15 cm water with 3 cm expiratory pressure relief. She feels well using CPAP sleeping well, sounder and she still using imipramine which helps helped her to feel less stiff has less joint pain and also sleep somewhat better. She endorsed no other symptoms today a geographic depression score was rated at 0 points Epworth sleepiness score at 6 points and fatigue severity score was not evaluated  REVIEW OF SYSTEMS: Full 14 system review of systems performed and notable only for those listed, all others are neg:  Constitutional: neg  Cardiovascular: neg Ear/Nose/Throat: neg  Skin: neg Eyes: neg Respiratory: neg Gastroitestinal: neg  Hematology/Lymphatic: neg  Endocrine: neg Musculoskeletal:neg Allergy/Immunology: neg Neurological: neg Psychiatric: neg Sleep : Obstructive sleep apnea with CPAP   ALLERGIES: Allergies  Allergen Reactions  . Penicillins Other (See Comments)    Childhood   . Sulfur Other (See Comments)    Childhood     HOME MEDICATIONS: Outpatient Medications Prior to Visit  Medication Sig Dispense Refill  . aspirin EC 81 MG tablet Take 81 mg by mouth daily.    . Cinnamon 500 MG capsule Take 500 mg by mouth daily.    . Esomeprazole Magnesium (NEXIUM PO) Take 20 mg by mouth daily.     Marland Kitchen imipramine (TOFRANIL) 25 MG tablet TAKE 3 TABLETS (75 MG TOTAL) BY MOUTH AT BEDTIME. 270 tablet 3  . losartan (COZAAR) 50 MG tablet Take 1 tablet by mouth at bedtime.    Marland Kitchen OVER THE COUNTER MEDICATION Take 1 tablet by mouth daily. Calcium, magnesium, zinc, plus D3  (one daily)     . OVER THE COUNTER MEDICATION Take 1,200 mg by mouth daily. Fish oil 1200mg  -360mg  omega 3 (takes daily)     . simvastatin (ZOCOR) 40 MG tablet TAKE 1 TABLET (40 MG TOTAL) BY MOUTH DAILY AT BEDTIME 90 tablet 3  . vitamin C (ASCORBIC ACID) 500 MG  tablet Take 1,000 mg by mouth daily.     No facility-administered medications prior to visit.     PAST MEDICAL HISTORY: Past Medical History:  Diagnosis Date  . Allergy   . Cancer ([place])   . Cataract   . GERD (gastroesophageal reflux disease)   . Hyperlipidemia   . Hypertension   . Stroke [facility])    ruptured aneurysm    PAST SURGICAL HISTORY: Past Surgical History:  Procedure Laterality Date  . APPENDECTOMY  1963  . BACK SURGERY  2007  . BREAST SURGERY    . DILATION AND CURETTAGE OF UTERUS    . EYE SURGERY  1999   bilateral cataract extraction  . moles  1965  . SPINE SURGERY    . TONSILLECTOMY  1959    FAMILY HISTORY: Family History  Problem Relation Age of Onset  . Cancer Mother  vaginal cancer, also HTN  . Prostate cancer Father        also HTN, dementia  . Diabetes Brother     SOCIAL HISTORY: Social History   Socioeconomic History  . Marital status: Single    Spouse name: Not on file  . Number of children: Not on file  . Years of education: Not on file  . Highest education level: Not on file  Social Needs  . Financial resource strain: Not on file  . Food insecurity - worry: Not on file  . Food insecurity - inability: Not on file  . Transportation needs - medical: Not on file  . Transportation needs - non-medical: Not on file  Occupational History  . Occupation: retired  Tobacco Use  . Smoking status: Never Smoker  . Smokeless tobacco: Never Used  Substance and Sexual Activity  . Alcohol use: Yes    Alcohol/week: 0.6 oz    Types: 1 Glasses of wine per week    Comment: 1 every 6 mts   . Drug use: No  . Sexual activity: No  Other Topics Concern  . Not on file  Social History Narrative   Walks daily. Divorced. Education: [employer].      Drinks 4 cups of caffeine daily.     PHYSICAL EXAM  Vitals:   12/27/17 1118  BP: 126/72  Pulse: 62  Weight: 175 lb (79.4 kg)   Body mass index is 30.04 kg/m.  Generalized: Well developed, in  no acute distress  Head: normocephalic and atraumatic,. Oropharynx benign  Neck: Supple, no carotid bruits  Cardiac: Regular rate rhythm, no murmur  Musculoskeletal: No deformity   Neurological examination   Mentation: Alert oriented to time, place, history taking. Attention span and concentration appropriate. Recent and remote memory intact.  Follows all commands speech and language fluent.   Cranial nerve II-XII: Fundoscopic exam not done Pupils were equal round reactive to light extraocular movements were full, visual field were full on confrontational test. Facial sensation and strength were normal. hearing was intact to finger rubbing bilaterally. Uvula tongue midline. head turning and shoulder shrug were normal and symmetric.Tongue protrusion into cheek strength was normal. Motor: normal bulk and tone, full strength in the BUE, BLE, fine finger movements normal, no pronator drift. No focal weakness Sensory: normal and symmetric to light touch, pinprick, and  Vibration,   Coordination: finger-nose-finger, heel-to-shin bilaterally, no dysmetria, no tremor Reflexes: 1+ upper lower and symmetric, plantar responses were flexor bilaterally. Gait and Station: Rising up from seated position without assistance, normal stance,  moderate stride, good arm swing, smooth turning, able to perform tiptoe, and heel walking without difficulty. Tandem gait is steady.  No assistive device  DIAGNOSTIC DATA (LABS, IMAGING, TESTING) - I reviewed patient records, labs, notes, testing and imaging myself where available.  Lab Results  Component Value Date   WBC 15.9 (H) 05/25/2017   HGB 12.6 05/25/2017   HCT 37.3 05/25/2017   MCV 87.6 05/25/2017   PLT 255 05/25/2017      Component Value Date/Time   NA 140 05/25/2017 2240   K 4.0 05/25/2017 2240   CL 106 05/25/2017 2240   CO2 25 05/25/2017 2240   GLUCOSE 119 (H) 05/25/2017 2240   BUN 14 05/25/2017 2240   CREATININE 1.11 (H) 05/25/2017 2240    CREATININE 0.98 (H) 07/03/2016 0835   CALCIUM 9.2 05/25/2017 2240   PROT 6.5 07/03/2016 0835   ALBUMIN 3.6 07/03/2016 0835   AST 16 07/03/2016 0835  ALT 13 07/03/2016 0835   ALKPHOS 44 07/03/2016 0835   BILITOT 0.4 07/03/2016 0835   GFRNONAA 48 (L) 05/25/2017 2240   GFRNONAA 57 (L) 07/03/2016 0835   GFRAA 55 (L) 05/25/2017 2240   GFRAA 66 07/03/2016 0835   Lab Results  Component Value Date   CHOL 155 07/03/2016   HDL 58 07/03/2016   LDLCALC 82 07/03/2016   TRIG 73 07/03/2016   CHOLHDL 2.7 07/03/2016   Lab Results  Component Value Date   HGBA1C 5.9 07/03/2016    Lab Results  Component Value Date   TSH 3.888 09/30/2013      ASSESSMENT AND PLAN  77 y.o. 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 She also has risk factors of hyperlipidemia She is doing extremely well she also has a history of obstructive sleep apnea here for CPAP compliance which is poor due to her recent family member being in the hospital for 86 days. CPAP data dated 10/28/2017-12/26/2017 shows greater than 4 hours usage at  52%.  Average usage 6 hours 20 minutes pressure 5-15 cm EPR level 3 AHI 3.2.  Continue aspirin 81 mg daily for secondary stroke prevention  Maintain strict control of hypertension with blood pressure goal below 130/90, today's reading  126/72  Lipids with LDL cholesterol goal below 70 mg/dL. continue Zocor Last  MRA of the head 01/04/17 stable  CPAP Compliance 52% Continue imipramine 25 mg 3 tablets at bedtime  Eat a healthy diet with plenty of whole grains, cereals, fruits and vegetables, exercise regularly , continue to walk and maintain ideal body weight. Follow-up in 3 months for repeat CPAP compliance Dennie Bible, [facility], [facility], APRN  [facility] [address] [place], [place] [address] [phone]

## 2017-12-27 ENCOUNTER — Encounter: Payer: Self-pay | Admitting: Nurse Practitioner

## 2017-12-27 ENCOUNTER — Ambulatory Visit: Payer: Medicare Other | Admitting: Nurse Practitioner

## 2017-12-27 VITALS — BP 126/72 | HR 62 | Wt 175.0 lb

## 2017-12-27 DIAGNOSIS — Z8673 Personal history of transient ischemic attack (TIA), and cerebral infarction without residual deficits: Secondary | ICD-10-CM | POA: Diagnosis not present

## 2017-12-27 DIAGNOSIS — E785 Hyperlipidemia, unspecified: Secondary | ICD-10-CM

## 2017-12-27 DIAGNOSIS — I671 Cerebral aneurysm, nonruptured: Secondary | ICD-10-CM | POA: Diagnosis not present

## 2017-12-27 DIAGNOSIS — Z9989 Dependence on other enabling machines and devices: Secondary | ICD-10-CM

## 2017-12-27 DIAGNOSIS — G4733 Obstructive sleep apnea (adult) (pediatric): Secondary | ICD-10-CM | POA: Insufficient documentation

## 2017-12-27 DIAGNOSIS — I10 Essential (primary) hypertension: Secondary | ICD-10-CM | POA: Diagnosis not present

## 2017-12-27 NOTE — Patient Instructions (Signed)
Continue aspirin 81 mg daily for secondary stroke prevention  Maintain strict control of hypertension with blood pressure goal below 130/90, today's reading  126/72  Lipids with LDL cholesterol goal below 70 mg/dL. continue Zocor Last  MRA of the head 01/04/17 stable  CPAP Compliance 52% Continue imipramine 25 mg 3 tablets at bedtime  Eat a healthy diet with plenty of whole grains, cereals, fruits and vegetables, exercise regularly , continue to walk and maintain ideal body weight. Follow-up in 3 months for repeat CPAP compliance

## 2017-12-28 NOTE — Progress Notes (Signed)
I agree with the assessment and plan as directed by NP .The patient is known to me as a [facility] patient but is followed by Stroke service as well .   Maddeline Roorda, MD

## 2018-03-31 ENCOUNTER — Encounter: Payer: Self-pay | Admitting: Nurse Practitioner

## 2018-04-01 NOTE — Progress Notes (Signed)
[facility]  PATIENT: Tracey Richard DOB: 1941/02/08   REASON FOR VISIT: Follow-up for obstructive sleep apnea with CPAP HISTORY FROM:patient    HISTORY OF PRESENT ILLNESS:UPDATE6/11/2019CM Tracey Richard, 77 year old female returns for follow-up with his obstructive sleep apnea and injured cerebral hemorrhage in 2016.  She is currently on aspirin without bruising or bleeding.  MRA of the brain 01/04/2017 was stable.  Blood pressure in the office today 126/65.  She remains on Lipitor without myalgias.  She reports no problems with her CPAP compliance data dated 03/02/2018-03/31/2018 shows compliance greater than 4 hours at 100%.  Average usage 8 hours 56 minutes.  pressure 5 to 15 cm.  EPR level 3.1 ESS 6 fatigue severity scale 12.  She returns for reevaluation    UPDATE 3/7/2019CM Tracey Richard, 77 year old female returns for follow-up with history of intracerebral hemorrhage in 2016.  She is currently on aspirin 0.81 daily without bruising or bleeding.  Most recent MRA of the brain 01/04/2017 was stable.  Blood pressure in the office today well controlled at 126/72.  She remains on Zocor without myalgias.  She also has a history of obstructive sleep apnea with CPAP.  She is here for compliance.  She explains that her compliance is poor because her niece was in the hospital for 86 days most of that time in the intensive care unit and she stayed at the hospital many nights.  Her CPAP data dated 10/28/2017-12/26/2017 shows greater than 4 hours usage of 52%.  Average usage 6 hours 20 minutes pressure 5-15 cm EPR level 3 AHI 3.2.  She returns for reevaluation  10/2317 CD/Tracey Richard states that lifelong she has been a good sleeper. She always has been easy to go to sleep and stay asleep, and that she requires 12-14 hours of sleep easily. She may still take naps, lasting several hours. She remembers being a sleepy teenager. She married at age 31 - and her husband mentioned to her  thatshe spneds more time in bed than anyone he ever knew. She would wake up with headaches. This Summer she went with friends to the [place] area by car and she slept throughout the trip,  she does not remember seeing the [place]!  She sleeps for many years on 3 tabs of imipramine, originally, decades ago , prescribed for headaches and pain (?).  She was hospitalized for possible stroke-TIA on 04/21/2015 and during the observation. Nurses had witnessed her not just to snore but to stop breathing intermittently. The patient then saw her primary care physician, Dr. Ivar Bury. His note from 05-07-15 reflects that the patient had a hemorrhagic stroke and therefore is not taking any baby aspirin she had a very mild headache no gait abnormality no motor abnormality since a stroke. She usually has well-controlled hypertension and diabetes he mentioned. The stroke affected the left basal ganglia and was occurring on 6-29. 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  Sleep habits are as follows: since her retirement in June 2011 she goes to bed later , watches more TV and  works late at the computer screen.  She does not have an established bedtime but her habit is to go to bed at 3-4 Am- and will sleep for at least 8 hours. She reports that she has vivid dreams every night, often if she wakes  out off dream stages she will go back to bed and continues the same story line. She does not report any sleep paralysis but dream intrusion. She is falling asleep very easily when not stimulated or physically active. Yesterday she took a 20 minute power nap and this refresh as her. During her nocturnal sleep she does not have to go to the bathroom or rarely. Her gentleman friend states that she does snore but he  has not noted her to have apnea or stop to breathe. She is also not restlessly tossing and turning. He has noted her to twitch or kick at night. She denies any irresistible urge to move before bedtime or before going to sleep. Her boyfriend is a early bird , but he gets a usually sleep until 8 or 9 AM at least. This especially when that does travel together. She drinks coffee in the morning about 2 cups, she rarely drinks iced tea or sodas and not in the afternoon. Interval history from 11/14/2016, I have the pleasure of seeing Tracey Richard today who has been highly compliant CPAP user and has used to machine 25 of the last 30 days over 4 hours compliance is 83%, average user time is 7 hours 4 minutes, the residual AHI is 3.1. She's using an AutoSet between 5 and 15 cm water with 3 cm expiratory pressure relief. She feels well using CPAP sleeping well, sounder and she still using imipramine which helps helped her to feel less stiff has less joint pain and also sleep somewhat better. She endorsed no other symptoms today a geographic depression score was rated at 0 points Epworth sleepiness score at 6 points and fatigue severity score was not evaluated  REVIEW OF SYSTEMS: Full 14 system review of systems performed and notable only for those listed, all others are neg:  Constitutional: neg  Cardiovascular: neg Ear/Nose/Throat: neg  Skin: neg Eyes: neg Respiratory: neg Gastroitestinal: neg  Hematology/Lymphatic: neg  Endocrine: neg Musculoskeletal:neg Allergy/Immunology: neg Neurological: neg Psychiatric: neg Sleep : Obstructive sleep apnea with CPAP   ALLERGIES: Allergies  Allergen Reactions  . Penicillins Other (See Comments)    Childhood   . Sulfur Other (See Comments)    Childhood     HOME MEDICATIONS: Outpatient Medications Prior to Visit  Medication Sig Dispense Refill  . aspirin EC 81 MG tablet Take 81 mg by mouth daily.    . Cinnamon 500 MG capsule Take 500 mg by mouth  daily.    . Esomeprazole Magnesium (NEXIUM PO) Take 20 mg by mouth daily.     Marland Kitchen imipramine (TOFRANIL) 25 MG tablet TAKE 3 TABLETS (75 MG TOTAL) BY MOUTH AT BEDTIME. 270 tablet 3  . losartan (COZAAR) 50 MG tablet Take 1 tablet by mouth at bedtime.    Marland Kitchen OVER THE COUNTER MEDICATION Take 1 tablet by mouth daily. Calcium, magnesium, zinc, plus D3  (one daily)     . OVER THE COUNTER MEDICATION Take 1,200 mg by mouth daily. Fish oil 1200mg  -360mg  omega 3 (takes daily)     . simvastatin (ZOCOR) 40 MG tablet TAKE 1 TABLET (40 MG TOTAL) BY MOUTH DAILY AT BEDTIME 90 tablet 3  . vitamin C (ASCORBIC ACID) 500 MG tablet Take 1,000 mg by mouth daily.     No facility-administered medications prior to visit.     PAST MEDICAL HISTORY: Past Medical History:  Diagnosis Date  . Allergy   . Cancer ([place])   . Cataract   . GERD (gastroesophageal reflux disease)   .  Hyperlipidemia   . Hypertension   . Stroke [facility])    ruptured aneurysm    PAST SURGICAL HISTORY: Past Surgical History:  Procedure Laterality Date  . APPENDECTOMY  1963  . BACK SURGERY  2007  . BREAST SURGERY    . DILATION AND CURETTAGE OF UTERUS    . EYE SURGERY  1999   bilateral cataract extraction  . moles  1965  . SPINE SURGERY    . TONSILLECTOMY  1959    FAMILY HISTORY: Family History  Problem Relation Age of Onset  . Cancer Mother        vaginal cancer, also HTN  . Prostate cancer Father        also HTN, dementia  . Diabetes Brother     SOCIAL HISTORY: Social History   Socioeconomic History  . Marital status: Single    Spouse name: Not on file  . Number of children: Not on file  . Years of education: Not on file  . Highest education level: Not on file  Occupational History  . Occupation: retired  Scientific laboratory technician  . Financial resource strain: Not on file  . Food insecurity:    Worry: Not on file    Inability: Not on file  . Transportation needs:    Medical: Not on file    Non-medical: Not on file  Tobacco Use    . Smoking status: Never Smoker  . Smokeless tobacco: Never Used  Substance and Sexual Activity  . Alcohol use: Yes    Alcohol/week: 0.6 oz    Types: 1 Glasses of wine per week    Comment: 1 every 6 mts   . Drug use: No  . Sexual activity: Never  Lifestyle  . Physical activity:    Days per week: Not on file    Minutes per session: Not on file  . Stress: Not on file  Relationships  . Social connections:    Talks on phone: Not on file    Gets together: Not on file    Attends religious service: Not on file    Active member of club or organization: Not on file    Attends meetings of clubs or organizations: Not on file    Relationship status: Not on file  . Intimate partner violence:    Fear of current or ex partner: Not on file    Emotionally abused: Not on file    Physically abused: Not on file    Forced sexual activity: Not on file  Other Topics Concern  . Not on file  Social History Narrative   Walks daily. Divorced. Education: [employer].      Drinks 4 cups of caffeine daily.     PHYSICAL EXAM  Vitals:   04/02/18 1416  BP: 126/65  Pulse: 75  Weight: 174 lb 9.6 oz (79.2 kg)  Height: 5\' 4"  (1.626 m)   Body mass index is 29.97 kg/m.  Generalized: Well developed, in no acute distress  Head: normocephalic and atraumatic,. Oropharynx benign mallopatti 3 Neck: Supple, no carotid bruits circumference 15.5 Cardiac: Regular rate rhythm, no murmur  Lungs clear Musculoskeletal: No deformity  Skin no edema Neurological examination   Mentation: Alert oriented to time, place, history taking. Attention span and concentration appropriate. Recent and remote memory intact.  Follows all commands speech and language fluent. ESS 6, FSS yellow  Cranial nerve II-XII: Pupils were equal round reactive to light extraocular movements were full, visual field were full on confrontational test. Facial sensation and  strength were normal. hearing was intact to finger rubbing bilaterally.  Uvula tongue midline. head turning and shoulder shrug were normal and symmetric.Tongue protrusion into cheek strength was normal. Motor: normal bulk and tone, full strength in the BUE, BLE, fine finger movements normal, no pronator drift. No focal weakness Sensory: normal and symmetric to light touch,  Coordination: finger-nose-finger, heel-to-shin bilaterally, no dysmetria, no tremor Reflexes: 1+ upper lower and symmetric, plantar responses were flexor bilaterally. Gait and Station: Rising up from seated position without assistance, normal stance,  moderate stride, good arm swing, smooth turning, able to perform tiptoe, and heel walking without difficulty. Tandem gait is steady.  No assistive device  DIAGNOSTIC DATA (LABS, IMAGING, TESTING) - I reviewed patient records, labs, notes, testing and imaging myself where available.  Lab Results  Component Value Date   WBC 15.9 (H) 05/25/2017   HGB 12.6 05/25/2017   HCT 37.3 05/25/2017   MCV 87.6 05/25/2017   PLT 255 05/25/2017      Component Value Date/Time   NA 140 05/25/2017 2240   K 4.0 05/25/2017 2240   CL 106 05/25/2017 2240   CO2 25 05/25/2017 2240   GLUCOSE 119 (H) 05/25/2017 2240   BUN 14 05/25/2017 2240   CREATININE 1.11 (H) 05/25/2017 2240   CREATININE 0.98 (H) 07/03/2016 0835   CALCIUM 9.2 05/25/2017 2240   PROT 6.5 07/03/2016 0835   ALBUMIN 3.6 07/03/2016 0835   AST 16 07/03/2016 0835   ALT 13 07/03/2016 0835   ALKPHOS 44 07/03/2016 0835   BILITOT 0.4 07/03/2016 0835   GFRNONAA 48 (L) 05/25/2017 2240   GFRNONAA 57 (L) 07/03/2016 0835   GFRAA 55 (L) 05/25/2017 2240   GFRAA 66 07/03/2016 0835   Lab Results  Component Value Date   CHOL 155 07/03/2016   HDL 58 07/03/2016   LDLCALC 82 07/03/2016   TRIG 73 07/03/2016   CHOLHDL 2.7 07/03/2016   Lab Results  Component Value Date   HGBA1C 5.9 07/03/2016    Lab Results  Component Value Date   TSH 3.888 09/30/2013      ASSESSMENT AND PLAN  77 y.o. 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 She also has risk factors of hyperlipidemia She is doing extremely well she also has a history of obstructive sleep apnea here for CPAP compliance. Data dated 03/02/2018-03/31/2018 shows compliance greater than 4 hours at 100%.  Average usage 8 hours 56 minutes.  pressure 5 to 15 cm.  EPR level 3.1 ESS 6 fatigue severity scale 12  Continue aspirin 81 mg daily for secondary stroke prevention  Maintain strict control of hypertension with blood pressure goal below 130/90, today's reading  126/65  Lipids with LDL cholesterol goal below 70 mg/dL. continue Zocor Last  MRA of the head 01/04/17 stable  CPAP Compliance 100% Continue imipramine 25 mg 3 tablets at bedtime  Eat a healthy diet with plenty of whole grains, cereals, fruits and vegetables, exercise regularly , continue to walk and maintain ideal body weight. Follow-up yearly for  CPAP compliance, stroke followup Dennie Bible, [facility], [facility], APRN  [facility] [address], [place], [place] [address] [phone]

## 2018-04-02 ENCOUNTER — Ambulatory Visit: Payer: Medicare Other | Admitting: Nurse Practitioner

## 2018-04-02 ENCOUNTER — Encounter: Payer: Self-pay | Admitting: Nurse Practitioner

## 2018-04-02 VITALS — BP 126/65 | HR 75 | Ht 64.0 in | Wt 174.6 lb

## 2018-04-02 DIAGNOSIS — E785 Hyperlipidemia, unspecified: Secondary | ICD-10-CM | POA: Diagnosis not present

## 2018-04-02 DIAGNOSIS — I671 Cerebral aneurysm, nonruptured: Secondary | ICD-10-CM

## 2018-04-02 DIAGNOSIS — Z9989 Dependence on other enabling machines and devices: Secondary | ICD-10-CM

## 2018-04-02 DIAGNOSIS — G4733 Obstructive sleep apnea (adult) (pediatric): Secondary | ICD-10-CM

## 2018-04-02 DIAGNOSIS — I10 Essential (primary) hypertension: Secondary | ICD-10-CM

## 2018-04-02 NOTE — Patient Instructions (Signed)
Continue aspirin 81 mg daily for secondary stroke prevention  Maintain strict control of hypertension with blood pressure goal below 130/90, today's reading  126/72  Lipids with LDL cholesterol goal below 70 mg/dL. continue Zocor Last  MRA of the head 01/04/17 stable  CPAP Compliance 100% Continue imipramine 25 mg 3 tablets at bedtime  Eat a healthy diet with plenty of whole grains, cereals, fruits and vegetables, exercise regularly , continue to walk and maintain ideal body weight. Follow-up yearly for  CPAP compliance, stroke followup

## 2018-04-12 NOTE — Progress Notes (Signed)
I agree with the assessment and plan as directed by NP .The patient is known to me .   Triniti Gruetzmacher, MD  

## 2018-05-27 ENCOUNTER — Telehealth: Payer: Self-pay | Admitting: Nurse Practitioner

## 2018-05-27 NOTE — Telephone Encounter (Addendum)
Called patient to discuss. She stated she has not had any other pain since then. She stated she has no other symptoms at all. She pointed out with her previous stroke she didn't have any pain at all. She also stated she fell a month ago onto her right side, did not hit her head.  This RN advised will send this message to Hoyle Sauer, and she'll get a call back tomorrow. Advised if she has any symptoms of a stroke such as facial drooping, difficulty speaking, weakness that she must go straight to ED.  She verbalized understanding, appreciation of call.

## 2018-05-27 NOTE — Telephone Encounter (Signed)
Pt has called to inform that she has not been feeling well in her head.  Pt states last week she had 2 sharp pains on the left side of her head.  Pt has accepted 1st available appointment with Dr Leonie Man.  Pt would like a call to see if NP Hoyle Sauer would make any suggestions as to what she may do while waiting to see Dr Leonie Man for this new problem

## 2018-05-28 NOTE — Telephone Encounter (Signed)
Spoke with patient and advised her of Carolyn's message. She stated she is still in bed, didn't sleep well.  She stated she has not had any further pains in her head. This RN reviewed our conversation, this RN's advice from call yesterday.  She verbalized understanding, appreciation.

## 2018-05-28 NOTE — Telephone Encounter (Signed)
I have no suggestions without seeing her in the office.  Please keep follow-up with Dr. Leonie Man

## 2018-06-04 ENCOUNTER — Other Ambulatory Visit: Payer: Self-pay | Admitting: Neurology

## 2018-06-04 NOTE — Progress Notes (Signed)
I agree with the assessment and plan as directed by NP .The patient is known to me .   Shauntelle Jamerson, MD  

## 2018-07-02 ENCOUNTER — Encounter: Payer: Self-pay | Admitting: Neurology

## 2018-07-02 ENCOUNTER — Ambulatory Visit: Payer: Medicare Other | Admitting: Neurology

## 2018-07-02 ENCOUNTER — Telehealth: Payer: Self-pay | Admitting: Neurology

## 2018-07-02 DIAGNOSIS — I671 Cerebral aneurysm, nonruptured: Secondary | ICD-10-CM

## 2018-07-02 DIAGNOSIS — G441 Vascular headache, not elsewhere classified: Secondary | ICD-10-CM

## 2018-07-02 NOTE — Patient Instructions (Signed)
I had a long discussion the patient with regards to her new onset of transient left temporal headaches and discussed differential diagnosis, plan for evaluation and answered questions. Recommend the check MRI scan the brain as well as check MRA of the brain to follow her left MCA aneurysm. Continue aspirin for stroke prevention and strict control of hypertension with blood pressure goal below 130/90 and lipids with LDL cholesterol goal below 70 mg percent. Continue follow-up with Dr. Brett Fairy for sleep apnea. No scheduled follow-up appointment with me is necessary.

## 2018-07-02 NOTE — Telephone Encounter (Signed)
[employer] Medicare order sent to GI. No auth they will reach out to the pt to schedule.  °

## 2018-07-02 NOTE — Progress Notes (Signed)
[facility] [address] [place]. [place] [address] [phone]       OFFICE FOLLOW-UP NOTE  [name] Date of Birth:  1940-12-11 Medical Record Number:  [id number]   HPI: [name] is a 77 year old Caucasian lady seen today for first office follow-up visit following admission for intracerebral hemorrhage in June 2016.Tracey Richard is a 77 y.o. female who reports that yesterday she awakened normal. Went for a walk with her friend and noted that she was Walking as if drunk. She was going to the right. She was taken back home and went to bed early which is unusual for her. Awakened this afternoon and her gait was much better but was noted to drop the mail from her right hand. Also noted when she went out to dinner that she was dropping her fork. Presented for evaluation at that time. Patient is right handed.  Date last known well: Date: 04/19/2015 Time last known well: Time: 16:30 tPA Given: No: ICH.Marland Kitchen She was admitted to the intensive care unit where she underwent close neurological monitoring as well as tight blood pressure control. Her blood pressure was not particularly elevated and was he controlled. She was cleared by speech therapy and started on no medications. MRI scan was obtained later on the day of admission and confirmed left basal ganglia hemorrhage without any underlying lesion. There is no significant mass effect, midline shift or intraventricular extension. Patient underwent echocardiogram, carotid Doppler both of which were normal. She had only minimal weakness of the right hand muscles which was improving at the time of discharge. She was seen by physical occupational speech therapy and found to have no therapy needs and was felt stable to be discharged home . She has done well and has not had any recurrent neurological symptoms. She states her blood pressure is well controlled. She in fact traveled to [place] to attend her  granddaughter's wedding. She is independent in all activities of daily living. She is very active. She admits to mild short-term memory difficulties which she states these existed even prior to her hemorrhage and are not any worse now. 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 Update 12/23/2015 : She returns for follow-up after last visit 6 months ago. She continues to do well without recurrent stroke or TIA symptoms. She's had only one episode of minor headache which she rated as 1 out of 10 and did not even need medication. She had a follow-up MRI scan of the brain on 07/11/15 which I personally reviewed and shows satisfactory resolution of the basal ganglia hemorrhage. 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 Patient is doing extremely well with no physical deficits from a brain hemorrhage. She's noticed some occasional short-term memory difficulties but these are not progressive or bothersome. She states her blood pressure is well controlled and today it is 117/99. She's tolerating Zocor well without muscle aches and pains. She has no complaints today. Update 07/02/2018 : She returns for follow-up with me after last visit 2-1/2 years ago. She has not had any recurrent stroke or TIA symptoms. She states she's had 2 episodes of sharp left temporal headaches about a month ago.This lasted only a few minutes but she felt tired and had soreness in the temple for several days after that. She did not seek medical help at that time. She denies any blurred vision, loss of  vision, jaw claudication, muscle aches or pains. She did have an MRA of the brain done after last visit didn't March 2018 which showed stable 2 x 2.5 mm left MCA bifurcation aneurysm. She remains on aspirin for stroke prevention she is tolerating well without bruising or bleeding. Her blood pressure is well controlled and today is 121/79. She  remains on Zocor which she is also tolerating well and last lipid profile checked 6 months ago by primary physician was satisfactory. She continues to follow regularly with Dr. Brett Fairy and Cecille Rubin, nurse practitioner for sleep apnea which appears to be stable as well. ROS:   14 system review of systems is positive for   Temporal headaches, sharp pains, memory loss, sleep apnea today and all  systems negative  PMH:  Past Medical History:  Diagnosis Date  . Allergy   . Cancer ([place])   . Cataract   . GERD (gastroesophageal reflux disease)   . Hyperlipidemia   . Hypertension   . Stroke [facility])    ruptured aneurysm    Social History:  Social History   Socioeconomic History  . Marital status: Single    Spouse name: Not on file  . Number of children: Not on file  . Years of education: Not on file  . Highest education level: Not on file  Occupational History  . Occupation: retired  Scientific laboratory technician  . Financial resource strain: Not on file  . Food insecurity:    Worry: Not on file    Inability: Not on file  . Transportation needs:    Medical: Not on file    Non-medical: Not on file  Tobacco Use  . Smoking status: Never Smoker  . Smokeless tobacco: Never Used  Substance and Sexual Activity  . Alcohol use: Yes    Alcohol/week: 1.0 standard drinks    Types: 1 Glasses of wine per week    Comment: 1 every 6 mts   . Drug use: No  . Sexual activity: Never  Lifestyle  . Physical activity:    Days per week: Not on file    Minutes per session: Not on file  . Stress: Not on file  Relationships  . Social connections:    Talks on phone: Not on file    Gets together: Not on file    Attends religious service: Not on file    Active member of club or organization: Not on file    Attends meetings of clubs or organizations: Not on file    Relationship status: Not on file  . Intimate partner violence:    Fear of current or ex partner: Not on file    Emotionally abused: Not on file      Physically abused: Not on file    Forced sexual activity: Not on file  Other Topics Concern  . Not on file  Social History Narrative   Walks daily. Divorced. Education: [employer].      Drinks 4 cups of caffeine daily.    Medications:   Current Outpatient Medications on File Prior to Visit  Medication Sig Dispense Refill  . aspirin EC 81 MG tablet Take 81 mg by mouth daily.    . Cinnamon 500 MG capsule Take 500 mg by mouth daily.    Marland Kitchen imipramine (TOFRANIL) 25 MG tablet TAKE THREE TABLETS BY MOUTH AT BEDTIME 270 tablet 2  . losartan (COZAAR) 50 MG tablet Take 1 tablet by mouth at bedtime.    Marland Kitchen OVER THE COUNTER MEDICATION Take  1 tablet by mouth daily. Calcium, magnesium, zinc, plus D3  (one daily)     . OVER THE COUNTER MEDICATION Take 1,200 mg by mouth daily. Fish oil 1200mg  -360mg  omega 3 (takes daily)     . ranitidine (ZANTAC) 75 MG tablet Take 75 mg by mouth 2 (two) times daily.    . simvastatin (ZOCOR) 40 MG tablet TAKE 1 TABLET (40 MG TOTAL) BY MOUTH DAILY AT BEDTIME 90 tablet 3  . vitamin C (ASCORBIC ACID) 500 MG tablet Take 1,000 mg by mouth daily.     No current facility-administered medications on file prior to visit.     Allergies:   Allergies  Allergen Reactions  . Penicillins Other (See Comments)    Childhood   . Sulfur Other (See Comments)    Childhood     Physical Exam General: well developed, well nourished elderly Caucasian lady not in distress, seated, in no evident distress Head: head normocephalic and atraumatic.  Neck: supple with no carotid or supraclavicular bruits Cardiovascular: regular rate and rhythm, no murmurs Musculoskeletal: no deformity Skin:  no rash/petichiae Vascular:  Normal pulses all extremities Vitals:   07/02/18 1143  BP: 121/74  Pulse: 71   Neurologic Exam Mental Status: Awake and fully alert. Oriented to place and time. Recent and remote memory intact. Attention span, concentration and fund of knowledge appropriate. Mood  and affect appropriate.  Cranial Nerves: Fundoscopic exam not done Pupils equal, briskly reactive to light. Extraocular movements full without nystagmus. Visual fields full to confrontation. Hearing diminished bilaterally despite hearing aids.. Facial sensation intact. Face, tongue, palate moves normally and symmetrically.  Motor: Normal bulk and tone. Normal strength in all tested extremity muscles. Sensory.: intact to touch ,pinprick .position and vibratory sensation.  Coordination: Rapid alternating movements normal in all extremities. Finger-to-nose and heel-to-shin performed accurately bilaterally. Gait and Station: Arises from chair without difficulty. Stance is normal. Gait demonstrates normal stride length and balance . Unable to heel, toe and tandem walk without difficulty.  Reflexes: 1+ and symmetric. Toes downgoing.       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 She is doing extremely well    PLAN: I had a long discussion the patient with regards to her new onset of transient left temporal headaches and discussed differential diagnosis, plan for evaluation and answered questions. Recommend the check MRI scan the brain as well as check MRA of the brain to follow her left MCA aneurysm. Continue aspirin for stroke prevention and strict control of hypertension with blood pressure goal below 130/90 and lipids with LDL cholesterol goal below 70 mg percent. Continue follow-up with Dr. Brett Fairy for sleep apnea. No scheduled follow-up appointment with me is necessary..Greater than 50% of time during this 25 minute visit was spent on counseling, discussion with patient and family and coordination of care.    Antony Contras, MD Note: This document was prepared with digital dictation and possible smart phrase technology. Any transcriptional errors that result from  this process are unintentional

## 2018-07-17 ENCOUNTER — Ambulatory Visit
Admission: RE | Admit: 2018-07-17 | Discharge: 2018-07-17 | Disposition: A | Discharge status: Home / Self Care | Payer: Medicare Other | Source: Ambulatory Visit | Attending: Neurology | Admitting: Neurology

## 2018-07-17 DIAGNOSIS — G441 Vascular headache, not elsewhere classified: Secondary | ICD-10-CM

## 2018-07-17 DIAGNOSIS — I671 Cerebral aneurysm, nonruptured: Secondary | ICD-10-CM

## 2018-07-19 ENCOUNTER — Telehealth: Payer: Self-pay | Admitting: *Deleted

## 2018-07-19 NOTE — Telephone Encounter (Signed)
Spoke with patient and informed her that her MRA head showed both of the small 2-3 mm aneurysms in the brain are stable. Advised there is no significant change compared with 3 years ago. No new or worrisome findings. Also informed her that the  MRI scan of the brain shows no new or worrisome findings. There is a stable appearance of old left brain hemorrhage from 2016. Advised her this is all good news. She verbalized understanding, appreciation.

## 2019-04-07 ENCOUNTER — Ambulatory Visit: Payer: Medicare Other | Admitting: Nurse Practitioner

## 2019-04-09 ENCOUNTER — Telehealth: Payer: Self-pay

## 2019-04-09 NOTE — Telephone Encounter (Signed)
Called and spoke with pt. Asked pt who her friend's name was so I could see if someone had tried calling him instead and she stated his name is Tracey Richard who is a pt of [facility]. Checked his chart to see if anyone had tried to call him and I do not see where there is any message in regards to a call for him. Nothing further needed.

## 2019-06-16 ENCOUNTER — Other Ambulatory Visit: Payer: Self-pay | Admitting: Neurology

## 2019-09-29 ENCOUNTER — Other Ambulatory Visit: Payer: Self-pay | Admitting: Neurology

## 2019-10-20 ENCOUNTER — Telehealth: Payer: Self-pay | Admitting: Neurology

## 2019-10-20 ENCOUNTER — Other Ambulatory Visit: Payer: Self-pay | Admitting: Neurology

## 2019-10-20 MED ORDER — IMIPRAMINE HCL 25 MG PO TABS: 75.0000 mg | ORAL_TABLET | Freq: Every day | ORAL | 0 refills | Status: DC

## 2019-10-20 NOTE — Telephone Encounter (Signed)
Sent the refill for the patient. She must keep upcoming apt in feb for future refills.

## 2019-10-20 NOTE — Telephone Encounter (Signed)
Pt is requesting a refill of imipramine (TOFRANIL) 25 MG tablet, to be sent to [facility] [address] [place]

## 2019-12-04 ENCOUNTER — Encounter: Payer: Self-pay | Admitting: Neurology

## 2019-12-04 ENCOUNTER — Other Ambulatory Visit: Payer: Self-pay

## 2019-12-04 ENCOUNTER — Ambulatory Visit: Payer: Medicare Other | Admitting: Neurology

## 2019-12-04 VITALS — BP 132/79 | HR 86 | Temp 97.1°F | Ht 64.0 in | Wt 171.0 lb

## 2019-12-04 DIAGNOSIS — G4733 Obstructive sleep apnea (adult) (pediatric): Secondary | ICD-10-CM

## 2019-12-04 DIAGNOSIS — I612 Nontraumatic intracerebral hemorrhage in hemisphere, unspecified: Secondary | ICD-10-CM

## 2019-12-04 DIAGNOSIS — Z9989 Dependence on other enabling machines and devices: Secondary | ICD-10-CM

## 2019-12-04 DIAGNOSIS — I10 Essential (primary) hypertension: Secondary | ICD-10-CM | POA: Diagnosis not present

## 2019-12-04 DIAGNOSIS — I671 Cerebral aneurysm, nonruptured: Secondary | ICD-10-CM | POA: Diagnosis not present

## 2019-12-04 NOTE — Progress Notes (Signed)
[facility] [address] [place]. [place] [address] [phone]       OFFICE FOLLOW-UP NOTE  [name] Date of Birth:  [birth year] Medical Record Number:  [id number]     RV - 12-04-2019-  [name] was planned to be here for a simple CPAP follow up visit-  She had undergone a HST that confirmed her OSA-  Quoted below.  She fell in Jan 23rd and has now a loose tooth. She stumped her toe, always the right foot, Mild foot drop?  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 [name] states that l she has been a good sleeper, it has always been easy to go to sleep and stay asleep, and that she requires 12-14 hours of sleep easily. She may still take naps, lasting several hours. She remembers being a sleepy teenager. She married at age 22 - and her husband mentioned to her that she spends more time in bed than anyone he ever knew. She would wake up with headaches. InSummer 2018 she went with friends to the [place] area and she slept throughout the trip, she does not remember seeing the [place]!  She was hospitalized for stroke on 04/21/2015 and her nurses had witnessed her to snore and to stop breathing intermittently. She has had a sleep study on 07-14-2015 and started on CPAP.  Epworth 10 on CPAP and 17 before. BMI: 29.7 The patient's primary care physician at that time, Dr. Ivar Bury, noted on 05-07-15 that the patient had a hemorrhagic stroke - she had a very mild headache, no gait abnormality, no speech abnormality since. She has well-controlled hypertension and diabetes.   STUDY RESULTS:  Total Recording Time: 7 hours, 19 minutes. Total Apnea/Hypopnea Index (AHI): 27.8/hr., RDI was 30.8 /hr. Average Oxygen Saturation: 93 %, Lowest Oxygen Desaturation: 83 %  Total Time in Oxygen Saturation below 89% was 1.8 minutes.  Average Heart Rate:   67 bpm (sinus rhythm between 52 and 120 bpm). IMPRESSION: Moderate obstructive sleep apnea is  still present, but only minimal hypoxia is associated. RECOMMENDATION: I recommend to continue CPAP treatment, if possible with CPAP auto titration between  5-15 cm water pressure and with 3 cm EPR.  Mask of patient's choice.   Compliance report: CPAP compliance on 01 December 2019 shows 100% usage by days and 97% compliance by hours.  The average usage time is 7 hours and 36 minutes each night she is using an AutoSet air sense with a minimum pressure of 5 maximum pressure of 15 cmH2O 3 cm expiratory pressure relief.  Residual AHI is 1.8/h excellent control of apnea and her 95th percentile pressure is 12.5 cm I do not need to make arrangements or any changes.  The patient has no Cheyne-Stokes respirations she has a minimal amount of central apnea and she has very moderate or mild air leakage.  I would like her to continue current settings and current interface.  Epworth sleepiness score was endorsed at 4 points with 1 point for the first 4 questions in the questionnaire.   Sitting and reading, watching television, being an active, being a passenger.  She feels that she is much more alert, and so much better overall in wakefulness and lack of fatigue since she has been using CPAP.   She also endorsed only 10 points of the fatigue severity scale for the lowest rating it is 9 points.   HPI: [name] is a 79 year old Caucasian lady seen today for first  office follow-up visit following admission for intracerebral hemorrhage in June 2016.Tracey Richard is a 79 y.o. female who reports that yesterday she awakened normal. Went for a walk with her friend and noted that she was Walking as if drunk. She was going to the right. She was taken back home and went to bed early which is unusual for her. Awakened this afternoon and her gait was much better but was noted to drop the mail from her right hand. Also noted when she went out to dinner that she was dropping her fork. Presented for evaluation at that  time. Patient is right handed.  Date last known well: Date: 04/19/2015 Time last known well: Time: 16:30 tPA Given: No: ICH.Marland Kitchen She was admitted to the intensive care unit where she underwent close neurological monitoring as well as tight blood pressure control. Her blood pressure was not particularly elevated and was he controlled. She was cleared by speech therapy and started on no medications. MRI scan was obtained later on the day of admission and confirmed left basal ganglia hemorrhage without any underlying lesion. There is no significant mass effect, midline shift or intraventricular extension. Patient underwent echocardiogram, carotid Doppler both of which were normal. She had only minimal weakness of the right hand muscles which was improving at the time of discharge. She was seen by physical occupational speech therapy and found to have no therapy needs and was felt stable to be discharged home . She has done well and has not had any recurrent neurological symptoms. She states her blood pressure is well controlled. She in fact traveled to [place] to attend her granddaughter's wedding. She is independent in all activities of daily living. She is very active. She admits to mild short-term memory difficulties which she states these existed even prior to her hemorrhage and are not any worse now. 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 Update 12/23/2015 : She returns for follow-up after last visit 6 months ago. She continues to do well without recurrent stroke or TIA symptoms. She's had only one episode of minor headache which she rated as 1 out of 10 and did not even need medication. She had a follow-up MRI scan of the brain on 07/11/15 which I personally reviewed and shows satisfactory resolution of the basal ganglia hemorrhage. 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 Patient is doing  extremely well with no physical deficits from a brain hemorrhage. She's noticed some occasional short-term memory difficulties but these are not progressive or bothersome. She states her blood pressure is well controlled and today it is 117/99. She's tolerating Zocor well without muscle aches and pains. She has no complaints today. Update 07/02/2018 : She returns for follow-up with me after last visit 2-1/2 years ago. She has not had any recurrent stroke or TIA symptoms. She states she's had 2 episodes of sharp left temporal headaches about a month ago.This lasted only a few minutes but she felt tired and had soreness in the temple for several days after that. She did not seek medical help at that time. She denies any blurred vision, loss of vision, jaw claudication, muscle aches or pains. She did have an MRA of the brain done after last visit didn't March 2018 which showed stable 2 x 2.5 mm left MCA bifurcation aneurysm. She remains on aspirin for stroke prevention she is tolerating well without bruising or bleeding.  Her blood pressure is well controlled and today is 121/79. She remains on Zocor which she is also tolerating well and last lipid profile checked 6 months ago by primary physician was satisfactory. She continues to follow regularly with Dr. Brett Fairy and Cecille Rubin, nurse practitioner for sleep apnea which appears to be stable as well. ROS:   14 system review of systems is positive for   Temporal headaches, sharp pains, memory loss, sleep apnea today and all  systems negative  PMH:  Past Medical History:  Diagnosis Date  . Allergy   . Cancer ([address])   . Cataract   . GERD (gastroesophageal reflux disease)   . Hyperlipidemia   . Hypertension   . Stroke [facility])    ruptured aneurysm    Social History:  Social History   Socioeconomic History  . Marital status: Single    Spouse name: Not on file  . Number of children: Not on file  . Years of education: Not on file  . Highest education  level: Not on file  Occupational History  . Occupation: retired  Tobacco Use  . Smoking status: Never Smoker  . Smokeless tobacco: Never Used  Substance and Sexual Activity  . Alcohol use: Yes    Alcohol/week: 1.0 standard drinks    Types: 1 Glasses of wine per week    Comment: 1 every 6 mts   . Drug use: No  . Sexual activity: Never  Other Topics Concern  . Not on file  Social History Narrative   Walks daily. Divorced. Education: [employer].      Drinks 4 cups of caffeine daily.   Social Determinants of Health   Financial Resource Strain:   . Difficulty of Paying Living Expenses: Not on file  Food Insecurity:   . Worried About Charity fundraiser in the Last Year: Not on file  . Ran Out of Food in the Last Year: Not on file  Transportation Needs:   . Lack of Transportation (Medical): Not on file  . Lack of Transportation (Non-Medical): Not on file  Physical Activity:   . Days of Exercise per Week: Not on file  . Minutes of Exercise per Session: Not on file  Stress:   . Feeling of Stress : Not on file  Social Connections:   . Frequency of Communication with Friends and Family: Not on file  . Frequency of Social Gatherings with Friends and Family: Not on file  . Attends Religious Services: Not on file  . Active Member of Clubs or Organizations: Not on file  . Attends Archivist Meetings: Not on file  . Marital Status: Not on file  Intimate Partner Violence:   . Fear of Current or Ex-Partner: Not on file  . Emotionally Abused: Not on file  . Physically Abused: Not on file  . Sexually Abused: Not on file    Medications:   Current Outpatient Medications on File Prior to Visit  Medication Sig Dispense Refill  . aspirin EC 81 MG tablet Take 81 mg by mouth daily.    . clindamycin (CLEOCIN) 150 MG capsule Take 150 mg by mouth 3 (three) times daily.    Marland Kitchen imipramine (TOFRANIL) 25 MG tablet Take 3 tablets (75 mg total) by mouth at bedtime. 270 tablet 0  .  losartan (COZAAR) 50 MG tablet Take 1 tablet by mouth at bedtime.    Marland Kitchen OVER THE COUNTER MEDICATION Take 1 tablet by mouth daily. Calcium, magnesium, zinc, plus D3  (one  daily)     . simvastatin (ZOCOR) 40 MG tablet TAKE 1 TABLET (40 MG TOTAL) BY MOUTH DAILY AT BEDTIME 90 tablet 3  . vitamin C (ASCORBIC ACID) 500 MG tablet Take 1,000 mg by mouth daily.     No current facility-administered medications on file prior to visit.    Allergies:   Allergies  Allergen Reactions  . Penicillins Other (See Comments)    Childhood   . Sulfur Other (See Comments)    Childhood     Physical Exam General: well developed, well nourished elderly Caucasian lady not in distress, seated, in no evident distress Head: head normocephalic and atraumatic.  Neck: supple with no carotid or supraclavicular bruits Cardiovascular: regular rate and rhythm, no murmurs Musculoskeletal: no deformity Skin:  no rash/petichiae Vascular:  Normal pulses all extremities Vitals:   12/04/19 0853  BP: 132/79  Pulse: 86  Temp: (!) 97.1 F (36.2 C)   Neurologic Exam Mental Status: Awake and fully alert. Oriented to place and time. Recent and remote memory intact. Attention span, concentration and fund of knowledge appropriate. Mood and affect appropriate.  Cranial Nerves: Fundoscopic exam not done Pupils equal, briskly reactive to light. Extraocular movements full without nystagmus. Visual fields full to confrontation. Hearing diminished bilaterally despite hearing aids.. Facial sensation intact. Face, tongue, palate moves normally and symmetrically.  Motor: Normal bulk and tone. Normal strength in all tested extremity muscles. Sensory.: intact to touch ,pinprick .position  But not vibratory sensation.  Coordination: Rapid alternating movements normal in all extremities. Finger-to-nose and heel-to-shin performed accurately bilaterally. Gait and Station: Arises from chair without difficulty. Stance is normal. Gait demonstrates  normal stride length and balance . Unable to heel, toe and tandem walk without difficulty.  Her right foot points slightly outwards.  Reflexes: 1+ and symmetric. She has hammertoes, and her right toe is not lifted of the floor.      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 She is doing extremely well.  OSA confirmed by HST- applied CPAP and autotitration works well, she has noted a remarkable benefit in alertness. High compliance.   Recent falls due to t her big toes getting cought, discussed walking stick, firm and supportive shoes and not walking barefoot.      PLAN: continue CPAP, consider podiatrist for possible lift in shoes.   Greater than 50% of time during this 25 minute visit was spent on counseling, discussion with patient and family and coordination of care.    [name], MD

## 2019-12-09 ENCOUNTER — Ambulatory Visit: Payer: Medicare Other | Admitting: Neurology

## 2019-12-12 ENCOUNTER — Ambulatory Visit: Payer: Medicare Other

## 2019-12-14 ENCOUNTER — Ambulatory Visit: Payer: Medicare Other | Attending: Internal Medicine

## 2019-12-14 DIAGNOSIS — Z23 Encounter for immunization: Secondary | ICD-10-CM | POA: Insufficient documentation

## 2019-12-14 NOTE — Progress Notes (Signed)
   Covid-19 Vaccination Clinic  Name:  Tracey Richard    MRN: [id number] DOB: October 01, 1941  12/14/2019  Ms. [name] was observed post Covid-19 immunization for 15 minutes without incidence. She was provided with Vaccine Information Sheet and instruction to access the V-Safe system.   [name] was instructed to call 911 with any severe reactions post vaccine: Marland Kitchen Difficulty breathing  . Swelling of your face and throat  . A fast heartbeat  . A bad rash all over your body  . Dizziness and weakness    Immunizations Administered    Name Date Dose VIS Date Route   Pfizer COVID-19 Vaccine 12/14/2019  9:25 AM 0.3 mL 10/03/2019 Intramuscular   Manufacturer: [place]   Lot: [id number]   [place]: [id number]

## 2020-01-07 ENCOUNTER — Ambulatory Visit: Payer: Medicare Other | Attending: Internal Medicine

## 2020-01-07 DIAGNOSIS — Z23 Encounter for immunization: Secondary | ICD-10-CM

## 2020-01-07 NOTE — Progress Notes (Signed)
   Covid-19 Vaccination Clinic  Name:  Tracey Richard    MRN: [id number] DOB: [birth year]  01/07/2020  Tracey Richard was observed post Covid-19 immunization for 15 minutes without incident. She was provided with Vaccine Information Sheet and instruction to access the V-Safe system.   Tracey Richard was instructed to call 911 with any severe reactions post vaccine: Marland Kitchen Difficulty breathing  . Swelling of face and throat  . A fast heartbeat  . A bad rash all over body  . Dizziness and weakness   Immunizations Administered    Name Date Dose VIS Date Route   Pfizer COVID-19 Vaccine 01/07/2020  9:50 AM 0.3 mL 10/03/2019 Intramuscular   Manufacturer: [place]   Lot: [id number]   [place]: [id number]

## 2020-02-13 ENCOUNTER — Other Ambulatory Visit: Payer: Self-pay | Admitting: Neurology

## 2020-08-07 ENCOUNTER — Ambulatory Visit: Payer: Medicare Other | Attending: Internal Medicine

## 2020-08-07 ENCOUNTER — Other Ambulatory Visit: Payer: Self-pay

## 2020-08-07 DIAGNOSIS — Z23 Encounter for immunization: Secondary | ICD-10-CM

## 2020-08-07 NOTE — Progress Notes (Signed)
° °  Covid-19 Vaccination Clinic  Name:  Tracey Richard    MRN: [id number] DOB: [birth year]  08/07/2020  Tracey Richard was observed post Covid-19 immunization for 15 minutes without incident. She was provided with Vaccine Information Sheet and instruction to access the V-Safe system.   Tracey Richard was instructed to call 911 with any severe reactions post vaccine:  Difficulty breathing   Swelling of face and throat   A fast heartbeat   A bad rash all over body   Dizziness and weakness

## 2020-10-27 DIAGNOSIS — M545 Low back pain, unspecified: Secondary | ICD-10-CM | POA: Diagnosis not present

## 2020-11-03 DIAGNOSIS — M5416 Radiculopathy, lumbar region: Secondary | ICD-10-CM | POA: Diagnosis not present

## 2020-11-03 DIAGNOSIS — M25561 Pain in right knee: Secondary | ICD-10-CM | POA: Diagnosis not present

## 2020-11-10 DIAGNOSIS — M545 Low back pain, unspecified: Secondary | ICD-10-CM | POA: Diagnosis not present

## 2020-11-17 DIAGNOSIS — M25561 Pain in right knee: Secondary | ICD-10-CM | POA: Diagnosis not present

## 2020-11-17 DIAGNOSIS — M5416 Radiculopathy, lumbar region: Secondary | ICD-10-CM | POA: Diagnosis not present

## 2020-12-01 DIAGNOSIS — I10 Essential (primary) hypertension: Secondary | ICD-10-CM | POA: Diagnosis not present

## 2020-12-01 DIAGNOSIS — G4700 Insomnia, unspecified: Secondary | ICD-10-CM | POA: Diagnosis not present

## 2020-12-01 DIAGNOSIS — K219 Gastro-esophageal reflux disease without esophagitis: Secondary | ICD-10-CM | POA: Diagnosis not present

## 2020-12-01 DIAGNOSIS — E7800 Pure hypercholesterolemia, unspecified: Secondary | ICD-10-CM | POA: Diagnosis not present

## 2020-12-01 DIAGNOSIS — N1831 Chronic kidney disease, stage 3a: Secondary | ICD-10-CM | POA: Diagnosis not present

## 2020-12-06 ENCOUNTER — Encounter: Payer: Self-pay | Admitting: Adult Health

## 2020-12-06 ENCOUNTER — Ambulatory Visit: Payer: Medicare Other | Admitting: Adult Health

## 2020-12-06 VITALS — BP 136/70 | HR 80 | Ht 63.0 in | Wt 166.0 lb

## 2020-12-06 DIAGNOSIS — G4733 Obstructive sleep apnea (adult) (pediatric): Secondary | ICD-10-CM

## 2020-12-06 DIAGNOSIS — Z9989 Dependence on other enabling machines and devices: Secondary | ICD-10-CM

## 2020-12-06 MED ORDER — IMIPRAMINE HCL 25 MG PO TABS: ORAL_TABLET | ORAL | 3 refills | Status: DC

## 2020-12-06 NOTE — Progress Notes (Signed)
PATIENT: Tracey Richard DOB: 01-02-1941  REASON FOR VISIT: follow up HISTORY FROM: patient  HISTORY OF PRESENT ILLNESS: Today 12/06/20:  Tracey Richard is a 80 year old female with a history of obstructive sleep apnea on CPAP.  She returns today for follow-up.  She reports that the CPAP is still working well for her.  She denies any new issues.  Returns today for evaluation.  Compliance Report:  Compliance Usage 11/06/2020 - 12/05/2020 Usage days 30/30 days (100%) >= 4 hours 26 days (87%) Average usage (days used) 6 hours 24 minutes  AirSense 10 AutoSet For Her Serial number [id number] Mode AutoSet for Her Min Pressure 5 cmH2O Max Pressure 15 cmH2O EPR Fulltime EPR level 3  Therapy Pressure - cmH2O Median: 10.5 95th percentile: 13.2 Maximum: 13.9 Leaks - L/min Median: 4.0 95th percentile: 17.1 Maximum: 29.9 Events per hour AI: 2.2 HI: 0.4 AHI: 2.6 Apnea Index Central: 0.3 Obstructive: 1.5 Unknown: 0.3  HISTORY  12-04-2019-  Tracey Richard was planned to be here for a simple CPAP follow up visit-  She had undergone a HST that confirmed her OSA-  Quoted below.  She fell in Jan 23rd and has now a loose tooth. She stumped her toe, always the right foot, Mild foot drop?  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 Tracey Richard states that l she has been a good sleeper, it has always been easy to go to sleep and stay asleep, and that she requires 12-14 hours of sleep easily. She may still take naps, lasting several hours. She remembers being a sleepy teenager. She married at age 31 - and her husband mentioned to her that she spends more time in bed than anyone he ever knew. She would wake up with headaches. InSummer 2018 she went with friends to the [place] area and she slept throughout the trip, she does not remember seeing the [place]!  She was hospitalized for stroke on 04/21/2015 and her nurses had witnessed her to snore and to stop breathing  intermittently. She has had a sleep study on 07-14-2015 and started on CPAP. [place] 10 on CPAP and 17 before. BMI: 29.7 The patient's primary care physician at that time, Dr. Ivar Bury, noted on 05-07-15 that the patient had a hemorrhagic stroke - she had a very mild headache, no gait abnormality, no speech abnormality since. She has well-controlled hypertension and diabetes.  REVIEW OF SYSTEMS: Out of a complete 14 system review of symptoms, the patient complains only of the following symptoms, and all other reviewed systems are negative.  FSS 15 ESS 4  ALLERGIES: Allergies  Allergen Reactions  . Elemental Sulfur Other (See Comments)    Childhood   . Penicillins Other (See Comments)    Childhood     HOME MEDICATIONS: Outpatient Medications Prior to Visit  Medication Sig Dispense Refill  . aspirin EC 81 MG tablet Take 81 mg by mouth daily.    . clindamycin (CLEOCIN) 150 MG capsule Take 150 mg by mouth 3 (three) times daily.    Marland Kitchen imipramine (TOFRANIL) 25 MG tablet TAKE 3 TABLETS BY MOUTH AT BEDTIME *KEEP FEB APT FOR FURTHER REFILLS 270 tablet 2  . losartan (COZAAR) 50 MG tablet Take 1 tablet by mouth at bedtime.    Marland Kitchen OVER THE COUNTER MEDICATION Take 1 tablet by mouth daily. Calcium, magnesium, zinc, plus D3  (one daily)     . simvastatin (ZOCOR) 40 MG tablet TAKE 1 TABLET (40 MG TOTAL) BY MOUTH DAILY AT BEDTIME  90 tablet 3  . vitamin C (ASCORBIC ACID) 500 MG tablet Take 1,000 mg by mouth daily.     No facility-administered medications prior to visit.    PAST MEDICAL HISTORY: Past Medical History:  Diagnosis Date  . Allergy   . Cancer ([place])   . Cataract   . GERD (gastroesophageal reflux disease)   . Hyperlipidemia   . Hypertension   . Stroke [facility])    ruptured aneurysm    PAST SURGICAL HISTORY: Past Surgical History:  Procedure Laterality Date  . APPENDECTOMY  1963  . BACK SURGERY  2007  . BREAST SURGERY    . DILATION AND CURETTAGE OF UTERUS    . EYE SURGERY  1999    bilateral cataract extraction  . moles  1965  . SPINE SURGERY    . TONSILLECTOMY  1959    FAMILY HISTORY: Family History  Problem Relation Age of Onset  . Cancer Mother        vaginal cancer, also HTN  . Prostate cancer Father        also HTN, dementia  . Diabetes Brother     SOCIAL HISTORY: Social History   Socioeconomic History  . Marital status: Single    Spouse name: Not on file  . Number of children: Not on file  . Years of education: Not on file  . Highest education level: Not on file  Occupational History  . Occupation: retired  Tobacco Use  . Smoking status: Never Smoker  . Smokeless tobacco: Never Used  Substance and Sexual Activity  . Alcohol use: Yes    Alcohol/week: 1.0 standard drink    Types: 1 Glasses of wine per week    Comment: 1 every 6 mts   . Drug use: No  . Sexual activity: Never  Other Topics Concern  . Not on file  Social History Narrative   Walks daily. Divorced. Education: [employer].      Drinks 4 cups of caffeine daily.   Social Determinants of Health   Financial Resource Strain: Not on file  Food Insecurity: Not on file  Transportation Needs: Not on file  Physical Activity: Not on file  Stress: Not on file  Social Connections: Not on file  Intimate Partner Violence: Not on file      PHYSICAL EXAM  Vitals:   12/06/20 1256  BP: 136/70  Pulse: 80  Weight: 166 lb (75.3 kg)  Height: 5\' 3"  (1.6 m)   Body mass index is 29.41 kg/m.  Generalized: Well developed, in no acute distress  Chest: Lungs clear to auscultation bilaterally  Neurological examination  Mentation: Alert oriented to time, place, history taking. Follows all commands speech and language fluent Cranial nerve II-XII: Extraocular movements were full, visual field were full on confrontational test Head turning and shoulder shrug  were normal and symmetric. Motor: The motor testing reveals 5 over 5 strength of all 4 extremities. Good symmetric motor tone is  noted throughout.  Sensory: Sensory testing is intact to soft touch on all 4 extremities. No evidence of extinction is noted.  Gait and station: Gait is normal.    DIAGNOSTIC DATA (LABS, IMAGING, TESTING) - I reviewed patient records, labs, notes, testing and imaging myself where available.  Lab Results  Component Value Date   WBC 15.9 (H) 05/25/2017   HGB 12.6 05/25/2017   HCT 37.3 05/25/2017   MCV 87.6 05/25/2017   PLT 255 05/25/2017      Component Value Date/Time   NA 140  05/25/2017 2240   K 4.0 05/25/2017 2240   CL 106 05/25/2017 2240   CO2 25 05/25/2017 2240   GLUCOSE 119 (H) 05/25/2017 2240   BUN 14 05/25/2017 2240   CREATININE 1.11 (H) 05/25/2017 2240   CREATININE 0.98 (H) 07/03/2016 0835   CALCIUM 9.2 05/25/2017 2240   PROT 6.5 07/03/2016 0835   ALBUMIN 3.6 07/03/2016 0835   AST 16 07/03/2016 0835   ALT 13 07/03/2016 0835   ALKPHOS 44 07/03/2016 0835   BILITOT 0.4 07/03/2016 0835   GFRNONAA 48 (L) 05/25/2017 2240   GFRNONAA 57 (L) 07/03/2016 0835   GFRAA 55 (L) 05/25/2017 2240   GFRAA 66 07/03/2016 0835   Lab Results  Component Value Date   CHOL 155 07/03/2016   HDL 58 07/03/2016   LDLCALC 82 07/03/2016   TRIG 73 07/03/2016   CHOLHDL 2.7 07/03/2016   Lab Results  Component Value Date   HGBA1C 5.9 07/03/2016   No results found for: VITAMINB12 Lab Results  Component Value Date   TSH 3.888 09/30/2013      ASSESSMENT AND PLAN 80 y.o. year old female  has a past medical history of Allergy, Cancer ([place]), Cataract, GERD (gastroesophageal reflux disease), Hyperlipidemia, Hypertension, and Stroke ([place]). here with:  1. OSA on CPAP  - CPAP compliance excellent - Good treatment of AHI  - Encourage patient to use CPAP nightly and > 4 hours each night - F/U in 1 year or sooner if needed   I spent 20 minutes of face-to-face and non-face-to-face time with patient.  This included previsit chart review, lab review, study review, order entry, electronic  health record documentation, patient education.  Ward Givens, MSN, NP-C 12/06/2020, 12:55 PM [facility] [address] [place], [place] [address] [phone]

## 2020-12-06 NOTE — Patient Instructions (Signed)
Continue using CPAP nightly and greater than 4 hours each night °If your symptoms worsen or you develop new symptoms please let us know.  ° °

## 2021-02-10 DIAGNOSIS — R7309 Other abnormal glucose: Secondary | ICD-10-CM | POA: Diagnosis not present

## 2021-02-10 DIAGNOSIS — R7303 Prediabetes: Secondary | ICD-10-CM | POA: Diagnosis not present

## 2021-02-10 DIAGNOSIS — G4730 Sleep apnea, unspecified: Secondary | ICD-10-CM | POA: Diagnosis not present

## 2021-02-10 DIAGNOSIS — K219 Gastro-esophageal reflux disease without esophagitis: Secondary | ICD-10-CM | POA: Diagnosis not present

## 2021-02-10 DIAGNOSIS — E7800 Pure hypercholesterolemia, unspecified: Secondary | ICD-10-CM | POA: Diagnosis not present

## 2021-02-10 DIAGNOSIS — G4700 Insomnia, unspecified: Secondary | ICD-10-CM | POA: Diagnosis not present

## 2021-02-10 DIAGNOSIS — I10 Essential (primary) hypertension: Secondary | ICD-10-CM | POA: Diagnosis not present

## 2021-02-10 DIAGNOSIS — M85852 Other specified disorders of bone density and structure, left thigh: Secondary | ICD-10-CM | POA: Diagnosis not present

## 2021-02-10 DIAGNOSIS — Z8673 Personal history of transient ischemic attack (TIA), and cerebral infarction without residual deficits: Secondary | ICD-10-CM | POA: Diagnosis not present

## 2021-02-10 DIAGNOSIS — Z87448 Personal history of other diseases of urinary system: Secondary | ICD-10-CM | POA: Diagnosis not present

## 2021-02-10 DIAGNOSIS — M5416 Radiculopathy, lumbar region: Secondary | ICD-10-CM | POA: Diagnosis not present

## 2021-02-10 DIAGNOSIS — N1831 Chronic kidney disease, stage 3a: Secondary | ICD-10-CM | POA: Diagnosis not present

## 2021-02-18 ENCOUNTER — Ambulatory Visit (HOSPITAL_COMMUNITY)
Admission: EM | Admit: 2021-02-18 | Discharge: 2021-02-18 | Disposition: A | Discharge status: Home / Self Care | Payer: Medicare Other

## 2021-02-18 ENCOUNTER — Encounter (HOSPITAL_COMMUNITY): Payer: Self-pay

## 2021-02-18 ENCOUNTER — Other Ambulatory Visit: Payer: Self-pay

## 2021-02-18 DIAGNOSIS — J3089 Other allergic rhinitis: Secondary | ICD-10-CM | POA: Diagnosis not present

## 2021-02-18 DIAGNOSIS — R0981 Nasal congestion: Secondary | ICD-10-CM

## 2021-02-18 MED ORDER — CETIRIZINE HCL 5 MG PO TABS: 5.0000 mg | ORAL_TABLET | Freq: Every day | ORAL | 0 refills | Status: DC

## 2021-02-18 MED ORDER — FLUTICASONE PROPIONATE 50 MCG/ACT NA SUSP: 2.0000 | Freq: Every day | NASAL | 0 refills | Status: DC

## 2021-02-18 NOTE — ED Triage Notes (Signed)
Patient presents to Urgent Care with complaints of chest and nasal congestion and headache x 2 days. Treating symptoms with tylenol. Pt states pcp instructed her to come to UC for eval.   Denies fever, abdominal pain, n/v, or diarrhea.

## 2021-02-18 NOTE — ED Provider Notes (Signed)
[facility]   MRN: [id number] DOB: 08-Oct-1941  Subjective:   Tracey Richard is a 80 y.o. female presenting for 2-day history of acute onset sinus headache, runny nose, postnasal drainage and coughing.  Denies fever, vision change, confusion, weakness, ear pain, chest pain, shortness of breath, body aches.  Patient states that she does not really feel sick but wanted to make sure she got evaluated going into the weekend.  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  She is followed very closely by her regular doctors and has been doing quite well.  Does not want COVID-19 testing as she does not feel sick.  For her symptoms she is only use Tylenol.  She does admit that she is walking outdoors every day for at least 2 hours and has had a difficult time when she is out there.  No current facility-administered medications for this encounter.  Current Outpatient Medications:  .  polyethylene glycol (MIRALAX) 17 g packet, 1 capful, Disp: , Rfl:  .  Wheat Dextrin (BENEFIBER DRINK MIX PO), 1 Tablespoon, Disp: , Rfl:  .  aspirin EC 81 MG tablet, Take 81 mg by mouth daily., Disp: , Rfl:  .  Calcium Carb-Cholecalciferol (CALCIUM + D3) 600-800 MG-UNIT TABS, 1 tablet with a meal, Disp: , Rfl:  .  esomeprazole (NEXIUM) 20 MG capsule, 1 capsule, Disp: , Rfl:  .  imipramine (TOFRANIL) 25 MG tablet, TAKE 3 TABLETS BY MOUTH AT BEDTIME, Disp: 270 tablet, Rfl: 3 .  losartan (COZAAR) 50 MG tablet, Take 1 tablet by mouth at bedtime., Disp: , Rfl:  .  OVER THE COUNTER MEDICATION, Take 1 tablet by mouth daily. Calcium, magnesium, zinc, plus D3  (one daily), Disp: , Rfl:  .  simvastatin (ZOCOR) 40 MG tablet, TAKE 1 TABLET (40 MG TOTAL) BY MOUTH DAILY AT BEDTIME, Disp: 90 tablet, Rfl: 3 .  vitamin C (ASCORBIC ACID) 500 MG tablet, Take 1,000 mg by mouth daily., Disp: , Rfl:  .  Zinc 30 MG TABS, 1 tablet, Disp: , Rfl:    Allergies  Allergen  Reactions  . Elemental Sulfur Other (See Comments)    Childhood   . Penicillins Other (See Comments)    Childhood   . Oxycodone Hcl Itching    Past Medical History:  Diagnosis Date  . Allergy   . Cancer ([place])   . Cataract   . GERD (gastroesophageal reflux disease)   . Hyperlipidemia   . Hypertension   . Stroke [facility])    ruptured aneurysm     Past Surgical History:  Procedure Laterality Date  . APPENDECTOMY  1963  . BACK SURGERY  2007  . BREAST SURGERY    . DILATION AND CURETTAGE OF UTERUS    . EYE SURGERY  1999   bilateral cataract extraction  . moles  1965  . SPINE SURGERY    . TONSILLECTOMY  1959    Family History  Problem Relation Age of Onset  . Cancer Mother        vaginal cancer, also HTN  . Prostate cancer Father        also HTN, dementia  . Diabetes Brother     Social History   Tobacco Use  . Smoking status: Never Smoker  . Smokeless tobacco: Never Used  Substance Use Topics  . Alcohol use: Yes    Alcohol/week: 1.0 standard drink    Types: 1 Glasses of wine per week  Comment: 1 every 6 mts   . Drug use: No    ROS   Objective:   Vitals: BP 130/69 (BP Location: Right Arm)   Pulse 74   Temp 98.6 F (37 C) (Oral)   Resp 16   SpO2 98%   Physical Exam Constitutional:      General: She is not in acute distress.    Appearance: Normal appearance. She is well-developed. She is not ill-appearing, toxic-appearing or diaphoretic.  HENT:     Head: Normocephalic and atraumatic.     Right Ear: Tympanic membrane, ear canal and external ear normal. No drainage or tenderness. No middle ear effusion. Tympanic membrane is not erythematous.     Left Ear: Tympanic membrane, ear canal and external ear normal. No drainage or tenderness.  No middle ear effusion. Tympanic membrane is not erythematous.     Nose: Congestion present. No rhinorrhea.     Mouth/Throat:     Mouth: Mucous membranes are moist. No oral lesions.     Pharynx: Oropharynx is clear. No  pharyngeal swelling, oropharyngeal exudate, posterior oropharyngeal erythema or uvula swelling.     Tonsils: No tonsillar exudate or tonsillar abscesses.  Eyes:     General: No scleral icterus.       Right eye: No discharge.        Left eye: No discharge.     Extraocular Movements: Extraocular movements intact.     Right eye: Normal extraocular motion.     Left eye: Normal extraocular motion.     Conjunctiva/sclera: Conjunctivae normal.     Pupils: Pupils are equal, round, and reactive to light.  Cardiovascular:     Rate and Rhythm: Normal rate and regular rhythm.     Pulses: Normal pulses.     Heart sounds: Normal heart sounds. No murmur heard. No friction rub. No gallop.   Pulmonary:     Effort: Pulmonary effort is normal. No respiratory distress.     Breath sounds: Normal breath sounds. No stridor. No wheezing, rhonchi or rales.  Musculoskeletal:     Cervical back: Normal range of motion and neck supple.  Lymphadenopathy:     Cervical: No cervical adenopathy.  Skin:    General: Skin is warm and dry.     Findings: No rash.  Neurological:     General: No focal deficit present.     Mental Status: She is alert and oriented to person, place, and time.     Cranial Nerves: No cranial nerve deficit.     Motor: No weakness.     Coordination: Coordination normal.     Gait: Gait normal.     Deep Tendon Reflexes: Reflexes normal.     Comments: Ambulates without assistance at a good pace.  Psychiatric:        Mood and Affect: Mood normal.        Behavior: Behavior normal.        Thought Content: Thought content normal.        Judgment: Judgment normal.      Assessment and Plan :   PDMP not reviewed this encounter.  1. Seasonal allergic rhinitis due to other allergic trigger   2. Nasal congestion     Neurologic exam is reassuring, I do not suspect any sign of an intracranial process or emergency.  Will use conservative management with Flonase, Zyrtec.  Strict ER precautions.  Counseled patient on potential for adverse effects with medications prescribed/recommended today, ER and return-to-clinic precautions discussed, patient verbalized understanding.  Jaynee Eagles, [place] 02/18/21 1737

## 2021-05-04 DIAGNOSIS — I10 Essential (primary) hypertension: Secondary | ICD-10-CM | POA: Diagnosis not present

## 2021-05-04 DIAGNOSIS — K219 Gastro-esophageal reflux disease without esophagitis: Secondary | ICD-10-CM | POA: Diagnosis not present

## 2021-05-04 DIAGNOSIS — E7800 Pure hypercholesterolemia, unspecified: Secondary | ICD-10-CM | POA: Diagnosis not present

## 2021-05-04 DIAGNOSIS — G4700 Insomnia, unspecified: Secondary | ICD-10-CM | POA: Diagnosis not present

## 2021-05-04 DIAGNOSIS — N1831 Chronic kidney disease, stage 3a: Secondary | ICD-10-CM | POA: Diagnosis not present

## 2021-05-24 DIAGNOSIS — G4733 Obstructive sleep apnea (adult) (pediatric): Secondary | ICD-10-CM | POA: Diagnosis not present

## 2021-06-28 DIAGNOSIS — S79912A Unspecified injury of left hip, initial encounter: Secondary | ICD-10-CM | POA: Diagnosis not present

## 2021-06-28 DIAGNOSIS — W19XXXA Unspecified fall, initial encounter: Secondary | ICD-10-CM | POA: Diagnosis not present

## 2021-08-01 DIAGNOSIS — G4700 Insomnia, unspecified: Secondary | ICD-10-CM | POA: Diagnosis not present

## 2021-08-01 DIAGNOSIS — E7800 Pure hypercholesterolemia, unspecified: Secondary | ICD-10-CM | POA: Diagnosis not present

## 2021-08-01 DIAGNOSIS — K219 Gastro-esophageal reflux disease without esophagitis: Secondary | ICD-10-CM | POA: Diagnosis not present

## 2021-08-01 DIAGNOSIS — I10 Essential (primary) hypertension: Secondary | ICD-10-CM | POA: Diagnosis not present

## 2021-08-01 DIAGNOSIS — N1831 Chronic kidney disease, stage 3a: Secondary | ICD-10-CM | POA: Diagnosis not present

## 2021-08-08 DIAGNOSIS — G4730 Sleep apnea, unspecified: Secondary | ICD-10-CM | POA: Diagnosis not present

## 2021-08-08 DIAGNOSIS — Z8673 Personal history of transient ischemic attack (TIA), and cerebral infarction without residual deficits: Secondary | ICD-10-CM | POA: Diagnosis not present

## 2021-08-08 DIAGNOSIS — Z1389 Encounter for screening for other disorder: Secondary | ICD-10-CM | POA: Diagnosis not present

## 2021-08-08 DIAGNOSIS — M85852 Other specified disorders of bone density and structure, left thigh: Secondary | ICD-10-CM | POA: Diagnosis not present

## 2021-08-08 DIAGNOSIS — N1831 Chronic kidney disease, stage 3a: Secondary | ICD-10-CM | POA: Diagnosis not present

## 2021-08-08 DIAGNOSIS — Z87448 Personal history of other diseases of urinary system: Secondary | ICD-10-CM | POA: Diagnosis not present

## 2021-08-08 DIAGNOSIS — Z0000 Encounter for general adult medical examination without abnormal findings: Secondary | ICD-10-CM | POA: Diagnosis not present

## 2021-08-08 DIAGNOSIS — E7800 Pure hypercholesterolemia, unspecified: Secondary | ICD-10-CM | POA: Diagnosis not present

## 2021-08-08 DIAGNOSIS — R7303 Prediabetes: Secondary | ICD-10-CM | POA: Diagnosis not present

## 2021-08-08 DIAGNOSIS — I10 Essential (primary) hypertension: Secondary | ICD-10-CM | POA: Diagnosis not present

## 2021-08-08 DIAGNOSIS — K219 Gastro-esophageal reflux disease without esophagitis: Secondary | ICD-10-CM | POA: Diagnosis not present

## 2021-08-08 DIAGNOSIS — L309 Dermatitis, unspecified: Secondary | ICD-10-CM | POA: Diagnosis not present

## 2021-10-18 DIAGNOSIS — Z1231 Encounter for screening mammogram for malignant neoplasm of breast: Secondary | ICD-10-CM | POA: Diagnosis not present

## 2021-10-27 DIAGNOSIS — H40013 Open angle with borderline findings, low risk, bilateral: Secondary | ICD-10-CM | POA: Diagnosis not present

## 2021-10-27 DIAGNOSIS — H35371 Puckering of macula, right eye: Secondary | ICD-10-CM | POA: Diagnosis not present

## 2021-10-27 DIAGNOSIS — H43813 Vitreous degeneration, bilateral: Secondary | ICD-10-CM | POA: Diagnosis not present

## 2021-10-27 DIAGNOSIS — Z961 Presence of intraocular lens: Secondary | ICD-10-CM | POA: Diagnosis not present

## 2021-10-28 DIAGNOSIS — R928 Other abnormal and inconclusive findings on diagnostic imaging of breast: Secondary | ICD-10-CM | POA: Diagnosis not present

## 2021-10-28 DIAGNOSIS — N6489 Other specified disorders of breast: Secondary | ICD-10-CM | POA: Diagnosis not present

## 2021-10-28 DIAGNOSIS — N6321 Unspecified lump in the left breast, upper outer quadrant: Secondary | ICD-10-CM | POA: Diagnosis not present

## 2021-10-28 DIAGNOSIS — R922 Inconclusive mammogram: Secondary | ICD-10-CM | POA: Diagnosis not present

## 2021-11-03 DIAGNOSIS — E7800 Pure hypercholesterolemia, unspecified: Secondary | ICD-10-CM | POA: Diagnosis not present

## 2021-11-03 DIAGNOSIS — K219 Gastro-esophageal reflux disease without esophagitis: Secondary | ICD-10-CM | POA: Diagnosis not present

## 2021-11-03 DIAGNOSIS — G4700 Insomnia, unspecified: Secondary | ICD-10-CM | POA: Diagnosis not present

## 2021-11-03 DIAGNOSIS — N1831 Chronic kidney disease, stage 3a: Secondary | ICD-10-CM | POA: Diagnosis not present

## 2021-11-03 DIAGNOSIS — I10 Essential (primary) hypertension: Secondary | ICD-10-CM | POA: Diagnosis not present

## 2021-11-10 DIAGNOSIS — C50911 Malignant neoplasm of unspecified site of right female breast: Secondary | ICD-10-CM | POA: Diagnosis not present

## 2021-11-10 DIAGNOSIS — C50211 Malignant neoplasm of upper-inner quadrant of right female breast: Secondary | ICD-10-CM | POA: Diagnosis not present

## 2021-11-10 DIAGNOSIS — C50412 Malignant neoplasm of upper-outer quadrant of left female breast: Secondary | ICD-10-CM | POA: Diagnosis not present

## 2021-11-10 DIAGNOSIS — N6012 Diffuse cystic mastopathy of left breast: Secondary | ICD-10-CM | POA: Diagnosis not present

## 2021-11-11 ENCOUNTER — Telehealth: Payer: Self-pay | Admitting: Hematology and Oncology

## 2021-11-11 ENCOUNTER — Encounter: Payer: Self-pay | Admitting: Family Medicine

## 2021-11-11 NOTE — Telephone Encounter (Signed)
Spoke to patient to confirm morning clinic appointment for 1/25, Tracey Richard will send paperwork

## 2021-11-14 ENCOUNTER — Encounter: Payer: Self-pay | Admitting: *Deleted

## 2021-11-15 ENCOUNTER — Other Ambulatory Visit: Payer: Self-pay | Admitting: *Deleted

## 2021-11-15 DIAGNOSIS — C50412 Malignant neoplasm of upper-outer quadrant of left female breast: Secondary | ICD-10-CM

## 2021-11-15 DIAGNOSIS — C50211 Malignant neoplasm of upper-inner quadrant of right female breast: Secondary | ICD-10-CM

## 2021-11-16 ENCOUNTER — Encounter: Payer: Self-pay | Admitting: *Deleted

## 2021-11-16 ENCOUNTER — Encounter: Payer: Self-pay | Admitting: Hematology and Oncology

## 2021-11-16 ENCOUNTER — Ambulatory Visit: Payer: Medicare Other | Admitting: Physical Therapy

## 2021-11-16 ENCOUNTER — Ambulatory Visit (HOSPITAL_BASED_OUTPATIENT_CLINIC_OR_DEPARTMENT_OTHER): Payer: Medicare Other | Admitting: Genetic Counselor

## 2021-11-16 ENCOUNTER — Inpatient Hospital Stay: Payer: Medicare Other | Attending: Hematology and Oncology

## 2021-11-16 ENCOUNTER — Encounter: Payer: Self-pay | Admitting: Genetic Counselor

## 2021-11-16 ENCOUNTER — Inpatient Hospital Stay: Payer: Medicare Other | Admitting: Licensed Clinical Social Worker

## 2021-11-16 ENCOUNTER — Other Ambulatory Visit: Payer: Self-pay

## 2021-11-16 ENCOUNTER — Ambulatory Visit
Admission: RE | Admit: 2021-11-16 | Discharge: 2021-11-16 | Disposition: A | Discharge status: Home / Self Care | Payer: Medicare Other | Source: Ambulatory Visit | Attending: Radiation Oncology | Admitting: Radiation Oncology

## 2021-11-16 ENCOUNTER — Inpatient Hospital Stay (HOSPITAL_BASED_OUTPATIENT_CLINIC_OR_DEPARTMENT_OTHER): Payer: Medicare Other | Admitting: Hematology and Oncology

## 2021-11-16 ENCOUNTER — Other Ambulatory Visit: Payer: Self-pay | Admitting: General Surgery

## 2021-11-16 DIAGNOSIS — Z79899 Other long term (current) drug therapy: Secondary | ICD-10-CM | POA: Insufficient documentation

## 2021-11-16 DIAGNOSIS — Z803 Family history of malignant neoplasm of breast: Secondary | ICD-10-CM | POA: Diagnosis not present

## 2021-11-16 DIAGNOSIS — Z808 Family history of malignant neoplasm of other organs or systems: Secondary | ICD-10-CM | POA: Insufficient documentation

## 2021-11-16 DIAGNOSIS — Z170 Estrogen receptor positive status [ER+]: Secondary | ICD-10-CM | POA: Insufficient documentation

## 2021-11-16 DIAGNOSIS — C50211 Malignant neoplasm of upper-inner quadrant of right female breast: Secondary | ICD-10-CM

## 2021-11-16 DIAGNOSIS — C50412 Malignant neoplasm of upper-outer quadrant of left female breast: Secondary | ICD-10-CM | POA: Insufficient documentation

## 2021-11-16 DIAGNOSIS — Z8042 Family history of malignant neoplasm of prostate: Secondary | ICD-10-CM

## 2021-11-16 LAB — CBC WITH DIFFERENTIAL (CANCER CENTER ONLY)
Abs Immature Granulocytes: 0.02 10*3/uL (ref 0.00–0.07)
Basophils Absolute: 0 10*3/uL (ref 0.0–0.1)
Basophils Relative: 1 %
Eosinophils Absolute: 0.3 10*3/uL (ref 0.0–0.5)
Eosinophils Relative: 3 %
HCT: 35.6 % — ABNORMAL LOW (ref 36.0–46.0)
Hemoglobin: 11.7 g/dL — ABNORMAL LOW (ref 12.0–15.0)
Immature Granulocytes: 0 %
Lymphocytes Relative: 22 %
Lymphs Abs: 1.7 10*3/uL (ref 0.7–4.0)
MCH: 29.1 pg (ref 26.0–34.0)
MCHC: 32.9 g/dL (ref 30.0–36.0)
MCV: 88.6 fL (ref 80.0–100.0)
Monocytes Absolute: 0.8 10*3/uL (ref 0.1–1.0)
Monocytes Relative: 9 %
Neutro Abs: 5.2 10*3/uL (ref 1.7–7.7)
Neutrophils Relative %: 65 %
Platelet Count: 248 10*3/uL (ref 150–400)
RBC: 4.02 MIL/uL (ref 3.87–5.11)
RDW: 13 % (ref 11.5–15.5)
WBC Count: 8 10*3/uL (ref 4.0–10.5)
nRBC: 0 % (ref 0.0–0.2)

## 2021-11-16 LAB — CMP (CANCER CENTER ONLY)
ALT: 17 U/L (ref 0–44)
AST: 18 U/L (ref 15–41)
Albumin: 4.2 g/dL (ref 3.5–5.0)
Alkaline Phosphatase: 51 U/L (ref 38–126)
Anion gap: 8 (ref 5–15)
BUN: 21 mg/dL (ref 8–23)
CO2: 26 mmol/L (ref 22–32)
Calcium: 9.6 mg/dL (ref 8.9–10.3)
Chloride: 106 mmol/L (ref 98–111)
Creatinine: 1.05 mg/dL — ABNORMAL HIGH (ref 0.44–1.00)
GFR, Estimated: 54 mL/min — ABNORMAL LOW (ref >60)
Glucose, Bld: 132 mg/dL — ABNORMAL HIGH (ref 70–99)
Potassium: 3.7 mmol/L (ref 3.5–5.1)
Sodium: 140 mmol/L (ref 135–145)
Total Bilirubin: 0.6 mg/dL (ref 0.3–1.2)
Total Protein: 7.1 g/dL (ref 6.5–8.1)

## 2021-11-16 LAB — GENETIC SCREENING ORDER

## 2021-11-16 NOTE — Progress Notes (Signed)
[place] Work  Initial Assessment   Tracey Richard is a 81 y.o. year old female accompanied by friend, Abigail Butts, and significant other, Art Arnell Sieving). Clinical Social Work was referred by [facility] for assessment of psychosocial needs.   SDOH (Social Determinants of Health) assessments performed: Yes SDOH Interventions    Flowsheet Row Most Recent Value  SDOH Interventions   Food Insecurity Interventions Intervention Not Indicated  Financial Strain Interventions Intervention Not Indicated  Housing Interventions Intervention Not Indicated  Transportation Interventions Intervention Not Indicated       Distress Screen completed: Yes ONCBCN DISTRESS SCREENING 11/16/2021  Screening Type Initial Screening  Distress experienced in past week (1-10) 0  Information Concerns Type Lack of info about diagnosis;Lack of info about treatment;Lack of info about complementary therapy choices      Family/Social Information:  Housing Arrangement: significant other Art Family members/support persons in your life? Family and friends Transportation concerns: no  Employment: Retired. Income source: retirement income Financial concerns: No Type of concern: None Food access concerns: no Services Currently in place:  n/a  Coping/ Adjustment to diagnosis: Patient understands treatment plan and what happens next? yes, feels confident in team and plan Concerns about diagnosis and/or treatment: I'm not especially worried about anything Patient reported stressors:  none Patient enjoys reading and walking Current coping skills/ strengths: Active sense of humor , Capable of independent living , Communication skills , Motivation for treatment/growth , Special hobby/interest , and Supportive family/friends     SUMMARY: Current SDOH Barriers:  No significant SDOH barriers noted today  Clinical Social Work Clinical Goal(s):  No clinical SW goals at this time  Interventions: Discussed the  importance of support during treatment Informed patient of the support team roles and support services at [facility] Provided CSW contact information and encouraged patient to call with any questions or concerns   Follow Up Plan: Patient will contact CSW with any support or resource needs Patient verbalizes understanding of plan: Yes    Christeen Douglas LCSW

## 2021-11-16 NOTE — Progress Notes (Signed)
Radiation Oncology         [phone] ________________________________  Name: Tracey Richard        MRN: [id number]  Date of Service: 11/16/2021 DOB: May 14, 1941  KZ:SWFUXN, Shanon Brow, MD  Stark Klein, MD     REFERRING PHYSICIAN: Stark Klein, MD   DIAGNOSIS: The primary encounter diagnosis was Malignant neoplasm of upper-inner quadrant of right female breast, unspecified estrogen receptor status ([place]). A diagnosis of Malignant neoplasm of upper-outer quadrant of left female breast, unspecified estrogen receptor status ([place]) was also pertinent to this visit.   HISTORY OF PRESENT ILLNESS: Tracey Richard is a 81 y.o. female seen in the multidisciplinary breast clinic for a new diagnosis of bilateral breast cancer. The patient was noted to have assymmetry in both breasts on screening mammogram. She underwent diagnostic imaging that showed a 1.8 cm mass in the 2:00 position of the right breast and her axilla was negative for adenopathy. On the left a benign lesion at 12:00 was sampled, and in addiation at 2:00 there was a 5 mm lesion and her left axilla was negative for adenopathy. 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 She's seen to discuss treatment recommendations of her cancer.    PREVIOUS RADIATION THERAPY: No   PAST MEDICAL HISTORY:  Past Medical History:  Diagnosis Date   Allergy    Cancer ([place])    Cataract    GERD (gastroesophageal reflux disease)    Hyperlipidemia    Hypertension    Stroke ([place])    ruptured aneurysm       PAST SURGICAL HISTORY: Past Surgical History:  Procedure Laterality Date   APPENDECTOMY  1963   BACK SURGERY  2007   BREAST SURGERY     DILATION AND CURETTAGE OF UTERUS     EYE SURGERY  1999   bilateral cataract extraction   moles  [place]     FAMILY HISTORY:  Family History  Problem Relation Age of Onset   Cancer Mother        vaginal cancer, also HTN   Prostate cancer Father        also HTN, dementia   Diabetes Brother      SOCIAL HISTORY:  reports that she has never smoked. She has never used smokeless tobacco. She reports current alcohol use of about 1.0 standard drink per week. She reports that she does not use drugs. The patient is in a relationship and lives in [place]. She is in a relationship with her partner Elesa Hacker. She is accompanied by he and her friend Graciella Freer. She enjoys seeing wildlife on her walks that she takes each afternoon, and is very social in her neighborhood and in the [place].       ALLERGIES: Elemental sulfur, Penicillins, Oxycodone hcl, and Tape   MEDICATIONS:  Current Outpatient Medications  Medication Sig Dispense Refill   aspirin EC 81 MG tablet Take 81 mg by mouth daily.     Calcium Carb-Cholecalciferol (CALCIUM + D3) 600-800 MG-UNIT TABS 1 tablet with a meal     cetirizine (ZYRTEC) 5 MG tablet Take 1 tablet (5 mg total) by mouth daily. 30 tablet 0   esomeprazole (NEXIUM) 20 MG capsule 1 capsule     fluticasone (FLONASE) 50 MCG/ACT nasal  spray Place 2 sprays into both nostrils daily. 16 g 0   FLUZONE HIGH-DOSE QUADRIVALENT 0.7 ML SUSY      imipramine (TOFRANIL) 25 MG tablet TAKE 3 TABLETS BY MOUTH AT BEDTIME 270 tablet 3   losartan (COZAAR) 50 MG tablet Take 1 tablet by mouth at bedtime.     OVER THE COUNTER MEDICATION Take 1 tablet by mouth daily. Calcium, magnesium, zinc, plus D3  (one daily)     polyethylene glycol (MIRALAX / GLYCOLAX) 17 g packet 1 capful     simvastatin (ZOCOR) 40 MG tablet TAKE 1 TABLET (40 MG TOTAL) BY MOUTH DAILY AT BEDTIME 90 tablet 3   vitamin C (ASCORBIC ACID) 500 MG tablet Take 1,000 mg by mouth daily.     Wheat Dextrin (BENEFIBER DRINK MIX PO) 1 Tablespoon     Zinc 30 MG TABS 1 tablet     No current  facility-administered medications for this encounter.     REVIEW OF SYSTEMS: On review of systems, the patient reports that she is doing well overall. She reports some chronic joint pains in her back and sometimes has shortness of breath when going up stairs but she is typically able to walk in the afternoon, sometimes up to 3 miles per day. She had some itching after her biopsy to the tape that was used. No other  breast specific complaints are noted.  PHYSICAL EXAM:  Wt Readings from Last 3 Encounters:  11/16/21 172 lb 1.6 oz (78.1 kg)  12/06/20 166 lb (75.3 kg)  12/04/19 171 lb (77.6 kg)   Temp Readings from Last 3 Encounters:  11/16/21 98.1 F (36.7 C) (Temporal)  02/18/21 98.6 F (37 C) (Oral)  12/04/19 (!) 97.1 F (36.2 C)   BP Readings from Last 3 Encounters:  11/16/21 (!) 147/69  02/18/21 130/69  12/06/20 136/70   Pulse Readings from Last 3 Encounters:  11/16/21 83  02/18/21 74  12/06/20 80    In general this is a well appearing caucasian female in no acute distress. She's alert and oriented x4 and appropriate throughout the examination. Cardiopulmonary assessment is negative for acute distress and she exhibits normal effort. Bilateral breast exam is deferred.    ECOG = 1  0 - Asymptomatic (Fully active, able to carry on all predisease activities without restriction)  1 - Symptomatic but completely ambulatory (Restricted in physically strenuous activity but ambulatory and able to carry out work of a light or sedentary nature. For example, light housework, office work)  2 - Symptomatic, <50% in bed during the day (Ambulatory and capable of all self care but unable to carry out any work activities. Up and about more than 50% of waking hours)  3 - Symptomatic, >50% in bed, but not bedbound (Capable of only limited self-care, confined to bed or chair 50% or more of waking hours)  4 - Bedbound (Completely disabled. Cannot carry on any self-care. Totally confined to bed  or chair)  5 - Death   Eustace Pen MM, Creech RH, Tormey DC, et al. [phone]). "Toxicity and response criteria of the [facility]". [place] Oncol. 5 (6): 649-55    LABORATORY DATA:  Lab Results  Component Value Date   WBC 8.0 11/16/2021   HGB 11.7 (L) 11/16/2021   HCT 35.6 (L) 11/16/2021   MCV 88.6 11/16/2021   PLT 248 11/16/2021   Lab Results  Component Value Date   NA 140 11/16/2021   K 3.7 11/16/2021   CL 106 11/16/2021  CO2 26 11/16/2021   Lab Results  Component Value Date   ALT 17 11/16/2021   AST 18 11/16/2021   ALKPHOS 51 11/16/2021   BILITOT 0.6 11/16/2021      RADIOGRAPHY: No results found.     IMPRESSION/PLAN: 1. Probable Stage IA, cT1aN0M0 grade 2, prognostics pending, invasive lobular carcinoma of the left breast with Synchronous Probable Stage IA, cT1cN0M0, ER/PR positive invasive lobular carcinoma of the right breast.  Dr. Lisbeth Renshaw discusses the pathology findings and reviews the nature of early stage bilatearl breast disease. The consensus from the breast conference includes breast conservation with bilateral lumpectomy.  Dr. Lisbeth Renshaw discussed the rationale for external radiotherapy to the breast  to reduce risks of local recurrence followed by antiestrogen therapy, and he also discusses scenarios for which radiation may be optional. This would be determined with her final pathology report.  We discussed the risks, benefits, short, and long term effects of radiotherapy, as well as the curative intent, and the patient may be interested in proceeding. Dr. Lisbeth Renshaw discusses the delivery and logistics of radiotherapy and anticipates a course of 4 weeks of radiotherapy to bilateral breasts. We will see her back a few weeks after surgery to discuss the simulation process and anticipate we starting radiotherapy about 4-6 weeks after surgery.  2. Possible genetic predisposition to malignancy. The patient is a candidate for genetic testing given her personal  and family history. She was offered referral and will meet with genetics today.   In a visit lasting 60 minutes, greater than 50% of the time was spent face to face reviewing her case, as well as in preparation of, discussing, and coordinating the patient's care.  The above documentation reflects my direct findings during this shared patient visit. Please see the separate note by Dr. Lisbeth Renshaw on this date for the remainder of the patient's plan of care.    Carola Rhine, [facility]    **Disclaimer: This note was dictated with voice recognition software. Similar sounding words can inadvertently be transcribed and this note may contain transcription errors which may not have been corrected upon publication of note.**

## 2021-11-16 NOTE — Assessment & Plan Note (Addendum)
This is a very pleasant 81 year old postmenopausal female patient with past medical history significant for hypertension, dyslipidemia referred to breast [place] given new diagnosis of right breast invasive lobular cancer.  She had screening mammogram which detected 1.8 cm mass in the right breast 2 o'clock position, negative axilla, biopsy suggestive of invasive lobular cancer, grade 1 through 2.  The left breast again at 2 o'clock position there is 5 mm mass biopsy to be invasive lobular cancer, grade 1 through 2.  Prognostic from the left breast mass showed ER 100% strong, PR 100% strong, HER2 pending FISH, Ki-67 of 1%.  We do not have prognostics on the right breast.  At this time given bilateral invasive lobular carcinoma given small size of the tumor, she will undergo bilateral lumpectomy upfront.  After surgery, we will review final pathology and prognostics.  If she continues to have small tumors, early and a low proliferation index, she will proceed with adjuvant radiation.   We have discussed about antiestrogen therapy today.  I discussed about tamoxifen as well as aromatase inhibitors.  I discussed about mechanism of action of tamoxifen, adverse effects from tamoxifen including but not limited to postmenopausal symptoms, increased risk of DVT/PE, increased risk of uterine cancer, questionably increased risk of cardiovascular events.  Benefit with tamoxifen would be improvement in bone density.  We also discussed about aromatase inhibitors, mechanism of action, adverse effects including but not limited to postmenopausal syndrome, arthralgias, myalgias, increased bone loss.  There may be an increased risk of cardiovascular events with either antiestrogen therapy.  At this time she is not quite sure as to which medication she would like to pursue however she was worried about a blood clot in the endometrial cancer risk hence she might want to proceed with anastrozole which is one of the aromatase inhibitors.     Again since she is older than 47, if she chooses to omit antiestrogen therapy, this can be considered however since she had bilateral cancer and since she is a pretty robust 81 year old, she might want to also pursue antiestrogen therapy after adjuvant radiation.  Have answered all questions to the best of my knowledge.  She will return to clinic to follow-up with [place] after surgery to discuss any further recommendations.  She expressed understanding of all the recommendations.

## 2021-11-16 NOTE — Progress Notes (Signed)
Start NOTE  Patient Care Team: Antony Contras, MD as PCP - General (Family Medicine) Calvert Cantor, MD as Consulting Physician (Ophthalmology) Mauro Kaufmann, RN as Oncology Nurse Navigator Rockwell Germany, RN as Oncology Nurse Navigator Stark Klein, MD as Consulting Physician (General Surgery) Benay Pike, MD as Consulting Physician (Hematology and Oncology) Kyung Rudd, MD as Consulting Physician (Radiation Oncology)  CHIEF COMPLAINTS/PURPOSE OF CONSULTATION:  Newly diagnosed breast cancer  HISTORY OF PRESENTING ILLNESS:  Tracey Richard 81 y.o. female is here because of recent diagnosis of bilateral breast cancer  Chronology SUMMARY OF ONCOLOGIC HISTORY: Oncology History Overview Note   10/18/2021, screening mammogram  Developing asymmetry in right breast lower inner quadrant anterior depth which is indeterminate. Additional views with [place] recommended. Asymmetry in left breast posterior depth central to nipple is indeterminate.  Diagnostic mammogram 2 * 1.3 cms irregular high density mass in right breast lower inner aspect anterior depth is indeterminate. [place] recommended.  Focal asymmetry in the left breast upper outer aspect posterior depth is indeterminate, ultrasound recommended.  Focal asymmetry in the left breast upper inner aspect middle depth is indeterminate, ultrasound recommended.  Ultrasound breast bilateral on 10/28/2021 showed 1.8 cm x 9 mm x 1.7 cm lobulated mass in the right breast upper inner aspect anterior depth highly suggestive of malignancy.  I believe the 9 cm reported in the radiology report could be dictation errors.  Ultrasound-guided biopsy was recommended. 0.5 x 0.5 x 0.4 cm lobulated mass in the left breast upper outer aspect posterior depth is suspicious of malignancy.  Ultrasound-guided biopsy is recommended 0.6 cm x 0.4 cm x 0.5 cm mass in the left breast upper inner aspect middle depth suspicious of malignancy,  ultrasound-guided biopsy is recommended.  Pathology provided at breast [place] showed left breast mass to be invasive lobular carcinoma grade 1 or 2, ER 100% strong, PR 100% strong, HER2 FISH pending, KI of 1%.  We did not have any prognostics on the right breast mass.  We will need to pathology at breast etc. number this is what they usually answer so I will call him back possible thinking  I reviewed her records extensively and collaborated the history with the patient.    Malignant neoplasm of upper-inner quadrant of right female breast ([place])  11/15/2021 Initial Diagnosis   Malignant neoplasm of upper-inner quadrant of right female breast ([place])   11/16/2021 Cancer Staging   Staging form: Breast, AJCC 8th Edition - Clinical stage from 11/16/2021: cT1c, cN0, cM0, G2 - Signed by Benay Pike, MD on 11/16/2021 Stage prefix: Initial diagnosis Method of lymph node assessment: Clinical Histologic grading system: 3 grade system    Malignant neoplasm of upper-outer quadrant of left female breast ([place])  11/15/2021 Initial Diagnosis   Malignant neoplasm of upper-outer quadrant of left female breast ([place])   11/16/2021 Cancer Staging   Staging form: Breast, AJCC 8th Edition - Clinical stage from 11/16/2021: cT1a, cN0, cM0, G2, ER+, PR+, HER2: Unknown - Signed by Benay Pike, MD on 11/16/2021 Stage prefix: Initial diagnosis Method of lymph node assessment: Clinical Histologic grading system: 3 grade system     She arrived to the appointment today at the breast [place] with her friend as well as her significant other. She is a retired Optometrist.  She does not have any children.  She may have taken hormone replacement therapy several years ago, she does not quite remember much about this.  No birth control.  She did not feel any palpable  masses in her breast.  Last Pap smear was several years ago.    Rest of the pertinent 10 point ROS reviewed and negative.  MEDICAL HISTORY:  Past Medical History:   Diagnosis Date   Allergy    Cancer ([place])    Cataract    GERD (gastroesophageal reflux disease)    Hyperlipidemia    Hypertension    Stroke ([place])    ruptured aneurysm    SURGICAL HISTORY: Past Surgical History:  Procedure Laterality Date   APPENDECTOMY  1963   BACK SURGERY  2007   BREAST SURGERY     DILATION AND CURETTAGE OF UTERUS     EYE SURGERY  1999   bilateral cataract extraction   moles  [place]    SOCIAL HISTORY: Social History   Socioeconomic History   Marital status: Single    Spouse name: Not on file   Number of children: Not on file   Years of education: Not on file   Highest education level: Not on file  Occupational History   Occupation: retired  Tobacco Use   Smoking status: Never   Smokeless tobacco: Never  Substance and Sexual Activity   Alcohol use: Yes    Alcohol/week: 1.0 standard drink    Types: 1 Glasses of wine per week    Comment: 1 every 6 mts    Drug use: No   Sexual activity: Never  Other Topics Concern   Not on file  Social History Narrative   Walks daily. Divorced. Education: [employer].      Drinks 4 cups of caffeine daily.   Social Determinants of Health   Financial Resource Strain: Not on file  Food Insecurity: Not on file  Transportation Needs: Not on file  Physical Activity: Not on file  Stress: Not on file  Social Connections: Not on file  Intimate Partner Violence: Not on file    FAMILY HISTORY: Family History  Problem Relation Age of Onset   Cancer Mother        vaginal cancer, also HTN   Prostate cancer Father        also HTN, dementia   Diabetes Brother     ALLERGIES:  is allergic to elemental sulfur, penicillins, oxycodone hcl, and tape.  MEDICATIONS:  Current Outpatient Medications  Medication Sig Dispense Refill   aspirin EC 81 MG tablet Take 81 mg by mouth daily.     Calcium Carb-Cholecalciferol (CALCIUM + D3) 600-800 MG-UNIT TABS 1 tablet with a meal      imipramine (TOFRANIL) 25 MG tablet TAKE 3 TABLETS BY MOUTH AT BEDTIME 270 tablet 3   losartan (COZAAR) 50 MG tablet Take 1 tablet by mouth at bedtime.     OVER THE COUNTER MEDICATION Take 1 tablet by mouth daily. Calcium, magnesium, zinc, plus D3  (one daily)     polyethylene glycol (MIRALAX / GLYCOLAX) 17 g packet 1 capful     simvastatin (ZOCOR) 40 MG tablet TAKE 1 TABLET (40 MG TOTAL) BY MOUTH DAILY AT BEDTIME 90 tablet 3   Wheat Dextrin (BENEFIBER DRINK MIX PO) 1 Tablespoon     Zinc 30 MG TABS 1 tablet     cetirizine (ZYRTEC) 5 MG tablet Take 1 tablet (5 mg total) by mouth daily. 30 tablet 0   esomeprazole (NEXIUM) 20 MG capsule 1 capsule     fluticasone (FLONASE) 50 MCG/ACT nasal spray Place 2 sprays into both nostrils daily. 16 g  0   FLUZONE HIGH-DOSE QUADRIVALENT 0.7 ML SUSY      vitamin C (ASCORBIC ACID) 500 MG tablet Take 1,000 mg by mouth daily.     No current facility-administered medications for this visit.    PHYSICAL EXAMINATION: ECOG PERFORMANCE STATUS: 0 - Asymptomatic  Vitals:   11/16/21 0842  BP: (!) 147/69  Pulse: 83  Resp: 18  Temp: 98.1 F (36.7 C)  SpO2: 98%   Filed Weights   11/16/21 0842  Weight: 172 lb 1.6 oz (78.1 kg)    GENERAL:alert, no distress and comfortable SKIN: skin color, texture, turgor are normal, no rashes or significant lesions EYES: normal, conjunctiva are pink and non-injected, sclera clear OROPHARYNX:no exudate, no erythema and lips, buccal mucosa, and tongue normal  NECK: supple, thyroid normal size, non-tender, without nodularity LYMPH:  no palpable lymphadenopathy in the cervical, axillary or inguinal LUNGS: clear to auscultation and percussion with normal breathing effort HEART: regular rate & rhythm and no murmurs and no lower extremity edema ABDOMEN:abdomen soft, non-tender and normal bowel sounds Musculoskeletal:no cyanosis of digits and no clubbing  PSYCH: alert & oriented x 3 with fluent speech NEURO: no focal  motor/sensory deficits BREAST: Bilateral breasts inspected and palpated.  Both breasts appear to have postbiopsy changes with some bruising and ecchymosis.  In the right breast there is a palpable area of concern, I could not well-defined this mass, this measured just over a centimeter on my exam.  No palpable regional adenopathy.  The left breast again at the biopsy site, there appears to be a small area of concern which is under a centimeter.  Again it is hard to discern if this is postbiopsy changes in hematoma versus the primary breast cancer.     LABORATORY DATA:  I have reviewed the data as listed Lab Results  Component Value Date   WBC 8.0 11/16/2021   HGB 11.7 (L) 11/16/2021   HCT 35.6 (L) 11/16/2021   MCV 88.6 11/16/2021   PLT 248 11/16/2021   Lab Results  Component Value Date   NA 140 11/16/2021   K 3.7 11/16/2021   CL 106 11/16/2021   CO2 26 11/16/2021    RADIOGRAPHIC STUDIES: I have personally reviewed the radiological reports and agreed with the findings in the report.  ASSESSMENT AND PLAN:  Malignant neoplasm of upper-inner quadrant of right female breast [facility]) This is a very pleasant 81 year old postmenopausal female patient with past medical history significant for hypertension, dyslipidemia referred to breast [place] given new diagnosis of right breast invasive lobular cancer.  She had screening mammogram which detected 1.8 cm mass in the right breast 2 o'clock position, negative axilla, biopsy suggestive of invasive lobular cancer, grade 1 through 2.  The left breast again at 2 o'clock position there is 5 mm mass biopsy to be invasive lobular cancer, grade 1 through 2.  Prognostic from the left breast mass showed ER 100% strong, PR 100% strong, HER2 pending FISH, Ki-67 of 1%.  We do not have prognostics on the right breast.  At this time given bilateral invasive lobular carcinoma given small size of the tumor, she will undergo bilateral lumpectomy upfront.  After surgery,  we will review final pathology and prognostics.  If she continues to have small tumors, early and a low proliferation index, she will proceed with adjuvant radiation.   We have discussed about antiestrogen therapy today.  I discussed about tamoxifen as well as aromatase inhibitors.  I discussed about mechanism of action of tamoxifen, adverse  effects from tamoxifen including but not limited to postmenopausal symptoms, increased risk of DVT/PE, increased risk of uterine cancer, questionably increased risk of cardiovascular events.  Benefit with tamoxifen would be improvement in bone density.  We also discussed about aromatase inhibitors, mechanism of action, adverse effects including but not limited to postmenopausal syndrome, arthralgias, myalgias, increased bone loss.  There may be an increased risk of cardiovascular events with either antiestrogen therapy.  At this time she is not quite sure as to which medication she would like to pursue however she was worried about a blood clot in the endometrial cancer risk hence she might want to proceed with anastrozole which is one of the aromatase inhibitors.    Again since she is older than 72, if she chooses to omit antiestrogen therapy, this can be considered however since she had bilateral cancer and since she is a pretty robust 81 year old, she might want to also pursue antiestrogen therapy after adjuvant radiation.  Have answered all questions to the best of my knowledge.  She will return to clinic to follow-up with [place] after surgery to discuss any further recommendations.  She expressed understanding of all the recommendations.   All questions were answered. The patient knows to call the clinic with any problems, questions or concerns.    Benay Pike, MD 11/16/21

## 2021-11-16 NOTE — Progress Notes (Signed)
REFERRING PROVIDER: Benay Pike, MD [place],  [place] [id number]  PRIMARY PROVIDER:  Antony Contras, MD  PRIMARY REASON FOR VISIT:  1. Malignant neoplasm of upper-inner quadrant of right breast in female, estrogen receptor positive ([place])   2. Malignant neoplasm of upper-outer quadrant of left female breast, unspecified estrogen receptor status ([place])   3. Family history of breast cancer   4. Family history of prostate cancer     HISTORY OF PRESENT ILLNESS:   Tracey Richard, a 80 y.o. female, was seen for a [facility] cancer genetics consultation at the request of Dr. Chryl Heck due to a personal and family history of cancer.  Tracey Richard presents to clinic today to discuss the possibility of a hereditary predisposition to cancer, to discuss genetic testing, and to further clarify her future cancer risks, as well as potential cancer risks for family members.   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 The treatment plan is pending.  She also reports a history of basal cell carcinoma.    CANCER HISTORY:  Oncology History Overview Note   10/18/2021, screening mammogram  Developing asymmetry in right breast lower inner quadrant anterior depth which is indeterminate. Additional views with [place] recommended. Asymmetry in left breast posterior depth central to nipple is indeterminate.  Diagnostic mammogram 2 * 1.3 cms irregular high density mass in right breast lower inner aspect anterior depth is indeterminate. [place] recommended.  Focal asymmetry in the left breast upper outer aspect posterior depth is indeterminate, ultrasound recommended.  Focal asymmetry in the left breast upper inner aspect middle depth is indeterminate, ultrasound recommended.  Ultrasound breast bilateral on 10/28/2021 showed 1.8 cm x 9 mm x 1.7 cm lobulated mass in the right breast upper inner aspect anterior depth highly suggestive of malignancy.  I believe  the 9 cm reported in the radiology report could be dictation errors.  Ultrasound-guided biopsy was recommended. 0.5 x 0.5 x 0.4 cm lobulated mass in the left breast upper outer aspect posterior depth is suspicious of malignancy.  Ultrasound-guided biopsy is recommended 0.6 cm x 0.4 cm x 0.5 cm mass in the left breast upper inner aspect middle depth suspicious of malignancy, ultrasound-guided biopsy is recommended.  Pathology provided at breast [facility] showed left breast mass to be invasive lobular carcinoma grade 1 or 2, ER 100% strong, PR 100% strong, HER2 FISH pending, KI of 1%.  We did not have any prognostics on the right breast mass.  We will need to pathology at breast etc. number this is what they usually answer so I will call him back possible thinking  I reviewed her records extensively and collaborated the history with the patient.    Malignant neoplasm of upper-inner quadrant of right female breast ([place])  11/15/2021 Initial Diagnosis   Malignant neoplasm of upper-inner quadrant of right female breast ([place])   11/16/2021 Cancer Staging   Staging form: Breast, AJCC 8th Edition - Clinical stage from 11/16/2021: cT1c, cN0, cM0, G2 - Signed by Benay Pike, MD on 11/16/2021 Stage prefix: Initial diagnosis Method of lymph node assessment: Clinical Histologic grading system: 3 grade system    Malignant neoplasm of upper-outer quadrant of left female breast ([place])  11/15/2021 Initial Diagnosis   Malignant neoplasm of upper-outer quadrant of left female breast ([place])   11/16/2021 Cancer Staging   Staging form: Breast, AJCC 8th Edition - Clinical stage from 11/16/2021: cT1a, cN0, cM0, G2, ER+, PR+, HER2: Unknown -  Signed by Benay Pike, MD on 11/16/2021 Stage prefix: Initial diagnosis Method of lymph node assessment: Clinical Histologic grading system: 3 grade system      RISK FACTORS:  Menarche was at age 43.  Nulliparous.  OCP use for approximately 0 years.  Menopausal status:  postmenopausal.  HRT use: 0 years. Colonoscopy: yes;  most recent in 2013 .   Past Medical History:  Diagnosis Date   Allergy    Cancer ([place])    Cataract    GERD (gastroesophageal reflux disease)    Hyperlipidemia    Hypertension    Stroke [facility])    ruptured aneurysm    Past Surgical History:  Procedure Laterality Date   APPENDECTOMY  1963   BACK SURGERY  2007   BREAST SURGERY     DILATION AND CURETTAGE OF UTERUS     EYE SURGERY  1999   bilateral cataract extraction   moles  [place] HISTORY:  We obtained a detailed, 4-generation family history.  Significant diagnoses are listed below: Family History  Problem Relation Age of Onset   Vaginal cancer Mother        d. 60   Prostate cancer Father        d. 24   Breast cancer Other        PGF's sister; dx after 44    Tracey Richard is unaware of previous family history of genetic testing for hereditary cancer risks. There is no reported Ashkenazi Jewish ancestry. There is no known consanguinity.  GENETIC COUNSELING ASSESSMENT: Tracey Richard is a 81 y.o. female with a personal and family history of cancer which is somewhat suggestive of a hereditary cancer syndrome and predisposition to cancer given her diagnoses of bilateral breast cancer and the presence of related cancers in the family. We, therefore, discussed and recommended the following at today's visit.   DISCUSSION: We discussed that 5 - 10% of cancer is hereditary.  Most cases of hereditary breast cancer are associated with mutations in BRCA1/2.  There are other genes that can be associated with hereditary breast cancer syndromes.  We discussed that testing is beneficial for several reasons including knowing how to follow individuals for their cancer risks, identifying whether potential treatment options would be beneficial, and understanding if other family members could be at risk for cancer and allowing them to undergo  genetic testing.   We reviewed the characteristics, features and inheritance patterns of hereditary cancer syndromes. We also discussed genetic testing, including the appropriate family members to test, the process of testing, insurance coverage and turn-around-time for results. We discussed the implications of a negative, positive, carrier and/or variant of uncertain significant result. We recommended Tracey Richard pursue genetic testing for a panel that includes genes associated with breast and prostate cancer.   The CustomNext-Cancer+RNAinsight panel offered by [facility] includes sequencing and rearrangement analysis for the following 47 genes:  APC, ATM, AXIN2, BARD1, BMPR1A, BRCA1, BRCA2, BRIP1, CDH1, CDK4, CDKN2A, CHEK2, DICER1, EPCAM, GREM1, HOXB13, MEN1, MLH1, MSH2, MSH3, MSH6, MUTYH, NBN, NF1, NF2, NTHL1, PALB2, PMS2, POLD1, POLE, PTEN, RAD51C, RAD51D, RECQL, RET, SDHA, SDHAF2, SDHB, SDHC, SDHD, SMAD4, SMARCA4, STK11, TP53, TSC1, TSC2, and VHL.  RNA data is routinely analyzed for use in variant interpretation for all genes.  Based on Tracey Richard's personal history of bilateral breast cancer, she meets medical criteria for genetic testing. Despite that she meets criteria, she may still have an out  of pocket cost. We discussed that if her out of pocket cost for testing is over $100, the laboratory should contact her and discuss the self-pay prices and/or patient pay assistance programs.    PLAN: After considering the risks, benefits, and limitations, Tracey Richard provided informed consent to pursue genetic testing and the blood sample was sent to [facility] for analysis of the CustomNext-Cancer +RNAinsight Panel. Results should be available within approximately 3 weeks' time, at which point they will be disclosed by telephone to Tracey Richard, as will any additional recommendations warranted by these results. Tracey Richard will receive a summary of her genetic counseling visit and  a copy of her results once available. This information will also be available in Epic.   Lastly, we encouraged Tracey Richard to remain in contact with cancer genetics annually so that we can continuously update the family history and inform her of any changes in cancer genetics and testing that may be of benefit for this family.   Tracey Richard's questions were answered to her satisfaction today. Our contact information was provided should additional questions or concerns arise. Thank you for the referral and allowing [place] to share in the care of your patient.   Joylyn Richard M. Joette Richard, [place], [facility] Genetic Counselor [email or web address] (P) [phone]  The patient was seen for a total of 20 minutes in face-to-face genetic counseling.  The patient was accompanied by her friend and significant other.  Drs. Lindi Adie and/or Burr Medico were available to discuss this case as needed.    _______________________________________________________________________ For Office Staff:  Number of people involved in session: 3 Was an Intern/ student involved with case: no

## 2021-11-17 ENCOUNTER — Encounter: Payer: Self-pay | Admitting: Genetic Counselor

## 2021-11-18 ENCOUNTER — Encounter: Payer: Self-pay | Admitting: *Deleted

## 2021-11-21 NOTE — Progress Notes (Signed)
Surgical Instructions    Your procedure is scheduled on Thursday February 2nd.  Report to [facility] Main Entrance "A" at 1 P.M., then check in with the Admitting office.  Call this number if you have problems the morning of surgery:  [phone]   If you have any questions prior to your surgery date call [phone]: Open Monday-Friday 8am-4pm    Remember:  Do not eat after midnight the night before your surgery  You may drink clear liquids until 12 pm the day of your surgery.   Clear liquids allowed are: Water, Non-Citrus Juices (without pulp), Carbonated Beverages, Clear Tea, Black Coffee ONLY (NO MILK, CREAM OR POWDERED CREAMER of any kind), and Gatorade  Please complete your PRE-SURGERY ENSURE that was provided to you by 12.pm.. the day of surgery.  Please, if able, drink it in one setting. DO NOT SIP.     Take these medicines the morning of surgery with A SIP OF WATER IF NEEDED  acetaminophen (TYLENOL) 500 MG tablet esomeprazole (NEXIUM) 20 MG capsule hydroxypropyl methylcellulose / hypromellose (ISOPTO TEARS / GONIOVISC) 2.5 % ophthalmic solution   Follow your surgeon's instructions on when to stop Aspirin.  If no instructions were given by your surgeon then you will need to call the office to get those instructions.     As of today, STOP taking any Aspirin (unless otherwise instructed by your surgeon) Aleve, Naproxen, Ibuprofen, Motrin, Advil, Goody's, BC's, all herbal medications, fish oil, and all vitamins.  After your COVID test   You are not required to quarantine however you are required to wear a well-fitting mask when you are out and around people not in your household.  If your mask becomes wet or soiled, replace with a new one.  Wash your hands often with soap and water for 20 seconds or clean your hands with an alcohol-based hand sanitizer that contains at least 60% alcohol.  Do not share personal items.  Notify your provider: if you are in close contact  with someone who has COVID  or if you develop a fever of 100.4 or greater, sneezing, cough, sore throat, shortness of breath or body aches.           Do not wear jewelry or makeup Do not wear lotions, powders, perfumes, or deodorant. Do not shave 48 hours prior to surgery.   Do not bring valuables to the hospital. Do not wear nail polish, gel polish, artificial nails, or any other type of covering on natural nails (fingers and toes) If you have artificial nails or gel coating that need to be removed by a nail salon, please have this removed prior to surgery. Artificial nails or gel coating may interfere with anesthesia's ability to adequately monitor your vital signs.             [facility] is not responsible for any belongings or valuables.  Do NOT Smoke (Tobacco/Vaping)  24 hours prior to your procedure  If you use a CPAP at night, you may bring your mask for your overnight stay.   Contacts, glasses, hearing aids, dentures or partials may not be worn into surgery, please bring cases for these belongings   For patients admitted to the hospital, discharge time will be determined by your treatment team.   Patients discharged the day of surgery will not be allowed to drive home, and someone needs to stay with them for 24 hours.  NO VISITORS WILL BE ALLOWED IN PRE-OP WHERE PATIENTS ARE PREPPED FOR SURGERY.  ONLY 1 SUPPORT PERSON MAY BE PRESENT IN THE WAITING ROOM WHILE YOU ARE IN SURGERY.  IF YOU ARE TO BE ADMITTED, ONCE YOU ARE IN YOUR ROOM YOU WILL BE ALLOWED TWO (2) VISITORS. 1 (ONE) VISITOR MAY STAY OVERNIGHT BUT MUST ARRIVE TO THE ROOM BY 8pm.  Minor children may have two parents present. Special consideration for safety and communication needs will be reviewed on a case by case basis.  Special instructions:    Oral Hygiene is also important to reduce your risk of infection.  Remember - BRUSH YOUR TEETH THE MORNING OF SURGERY WITH YOUR REGULAR TOOTHPASTE   [place]- Preparing For  Surgery  Before surgery, you can play an important role. Because skin is not sterile, your skin needs to be as free of germs as possible. You can reduce the number of germs on your skin by washing with CHG (chlorahexidine gluconate) Soap before surgery.  CHG is an antiseptic cleaner which kills germs and bonds with the skin to continue killing germs even after washing.     Please do not use if you have an allergy to CHG or antibacterial soaps. If your skin becomes reddened/irritated stop using the CHG.  Do not shave (including legs and underarms) for at least 48 hours prior to first CHG shower. It is OK to shave your face.  Please follow these instructions carefully.     Shower the NIGHT BEFORE SURGERY and the MORNING OF SURGERY with CHG Soap.   If you chose to wash your hair, wash your hair first as usual with your normal shampoo. After you shampoo, rinse your hair and body thoroughly to remove the shampoo.  Then [employer] and genitals (private parts) with your normal soap and rinse thoroughly to remove soap.  After that Use CHG Soap as you would any other liquid soap. You can apply CHG directly to the skin and wash gently with a scrungie or a clean washcloth.   Apply the CHG Soap to your body ONLY FROM THE NECK DOWN.  Do not use on open wounds or open sores. Avoid contact with your eyes, ears, mouth and genitals (private parts). Wash Face and genitals (private parts)  with your normal soap.   Wash thoroughly, paying special attention to the area where your surgery will be performed.  Thoroughly rinse your body with warm water from the neck down.  DO NOT shower/wash with your normal soap after using and rinsing off the CHG Soap.  Pat yourself dry with a CLEAN TOWEL.  Wear CLEAN PAJAMAS to bed the night before surgery  Place CLEAN SHEETS on your bed the night before your surgery  DO NOT SLEEP WITH PETS.   Day of Surgery:  Take a shower with CHG soap. Wear Clean/Comfortable  clothing the morning of surgery Do not apply any deodorants/lotions.   Remember to brush your teeth WITH YOUR REGULAR TOOTHPASTE.   Please read over the following fact sheets that you were given.

## 2021-11-22 ENCOUNTER — Other Ambulatory Visit: Payer: Self-pay

## 2021-11-22 ENCOUNTER — Encounter (HOSPITAL_COMMUNITY)
Admission: RE | Admit: 2021-11-22 | Discharge: 2021-11-22 | Disposition: A | Discharge status: Home / Self Care | Payer: Medicare Other | Source: Ambulatory Visit | Attending: General Surgery | Admitting: General Surgery

## 2021-11-22 ENCOUNTER — Encounter (HOSPITAL_COMMUNITY): Payer: Self-pay

## 2021-11-22 VITALS — BP 120/70 | HR 65 | Temp 98.2°F | Resp 17 | Ht 63.0 in | Wt 171.2 lb

## 2021-11-22 DIAGNOSIS — R7303 Prediabetes: Secondary | ICD-10-CM | POA: Insufficient documentation

## 2021-11-22 DIAGNOSIS — Z01818 Encounter for other preprocedural examination: Secondary | ICD-10-CM | POA: Insufficient documentation

## 2021-11-22 HISTORY — DX: Sleep apnea, unspecified: G47.30

## 2021-11-22 HISTORY — DX: Chronic kidney disease, unspecified: N18.9

## 2021-11-22 HISTORY — DX: Prediabetes: R73.03

## 2021-11-22 LAB — HEMOGLOBIN A1C
Hgb A1c MFr Bld: 5.8 % — ABNORMAL HIGH (ref 4.8–5.6)
Mean Plasma Glucose: 119.76 mg/dL

## 2021-11-22 LAB — GLUCOSE, CAPILLARY: Glucose-Capillary: 101 mg/dL — ABNORMAL HIGH (ref 70–99)

## 2021-11-22 NOTE — Progress Notes (Signed)
PCP - Antony Contras MD Cardiologist - none  PPM/ICD - denies Device Orders -  Rep Notified -   Chest x-ray -  EKG - 11/22/20 Stress Test -  ECHO - 04/22/15 Cardiac Cath -   Sleep Study - 07/30/15 CPAP - yes  Fasting Blood Sugar - 98-104 Checks Blood Sugar-1-2 times a week  Blood Thinner Instructions:n/a Aspirin Instructions:last dose more than a week ago   ERAS Protcol -yes- clear liquids until 12pm PRE-SURGERY Ensure or G2-Ensure  COVID TEST- no,ambulatory surgery   Anesthesia review: yes-seed patient.  Patient denies shortness of breath, fever, cough and chest pain at PAT appointment   All instructions explained to the patient, with a verbal understanding of the material. Patient agrees to go over the instructions while at home for a better understanding. Patient also instructed to self quarantine after being tested for COVID-19. The opportunity to ask questions was provided.

## 2021-11-22 NOTE — Progress Notes (Signed)
Surgical Instructions    Your procedure is scheduled on Thursday February 2nd.  Report to Tracey Richard Main Entrance "A" at 1 P.M., then check in with the Admitting office.  Call this number if you have problems the morning of surgery:  [phone]   If you have any questions prior to your surgery date call [phone]: Open Monday-Friday 8am-4pm    Remember:  Do not eat after midnight the night before your surgery  You may drink clear liquids until 12 pm the day of your surgery.   Clear liquids allowed are: Water, Non-Citrus Juices (without pulp), Carbonated Beverages, Clear Tea, Black Coffee ONLY (NO MILK, CREAM OR POWDERED CREAMER of any kind), and Gatorade  Please complete your PRE-SURGERY ENSURE that was provided to you by 12.pm.. the day of surgery.  Please, if able, drink it in one setting. DO NOT SIP.     Take these medicines the morning of surgery with A SIP OF WATER IF NEEDED  acetaminophen (TYLENOL) 500 MG tablet esomeprazole (NEXIUM) 20 MG capsule hydroxypropyl methylcellulose / hypromellose (ISOPTO TEARS / GONIOVISC) 2.5 % ophthalmic solution   Follow your surgeon's instructions on when to stop Aspirin.  If no instructions were given by your surgeon then you will need to call the office to get those instructions.     As of today, STOP taking any Aspirin (unless otherwise instructed by your surgeon) Aleve, Naproxen, Ibuprofen, Motrin, Advil, Goody's, BC's, all herbal medications, fish oil, and all vitamins.  HOW TO MANAGE YOUR DIABETES BEFORE AND AFTER SURGERY  Why is it important to control my blood sugar before and after surgery? Improving blood sugar levels before and after surgery helps healing and can limit problems. A way of improving blood sugar control is eating a healthy diet by:  Eating less sugar and carbohydrates  Increasing activity/exercise  Talking with your doctor about reaching your blood sugar goals High blood sugars (greater than 180 mg/dL) can  raise your risk of infections and slow your recovery, so you will need to focus on controlling your diabetes during the weeks before surgery. Make sure that the doctor who takes care of your diabetes knows about your planned surgery including the date and location.  How do I manage my blood sugar before surgery? Check your blood sugar at least 4 times a day, starting 2 days before surgery, to make sure that the level is not too high or low. Check your blood sugar the morning of your surgery when you wake up and every 2 hours until you get to the Short Stay unit. If your blood sugar is less than 70 mg/dL, you will need to treat for low blood sugar: Do not take insulin. Treat a low blood sugar (less than 70 mg/dL) with  cup of clear juice (cranberry or apple), 4 glucose tablets, OR glucose gel. Recheck blood sugar in 15 minutes after treatment (to make sure it is greater than 70 mg/dL). If your blood sugar is not greater than 70 mg/dL on recheck, call [phone]  for further instructions. Report your blood sugar to the short stay nurse when you get to Short Stay.  If you are admitted to the hospital after surgery: Your blood sugar will be checked by the staff and you will probably be given insulin after surgery (instead of oral diabetes medicines) to make sure you have good blood sugar levels. The goal for blood sugar control after surgery is 80-180 mg/dL.      After your COVID test  You are not required to quarantine however you are required to wear a well-fitting mask when you are out and around people not in your household.  If your mask becomes wet or soiled, replace with a new one.  Wash your hands often with soap and water for 20 seconds or clean your hands with an alcohol-based hand sanitizer that contains at least 60% alcohol.  Do not share personal items.  Notify your provider: if you are in close contact with someone who has COVID  or if you develop a fever of 100.4 or  greater, sneezing, cough, sore throat, shortness of breath or body aches.           Do not wear jewelry or makeup Do not wear lotions, powders, perfumes, or deodorant. Do not shave 48 hours prior to surgery.   Do not bring valuables to the hospital. Do not wear nail polish, gel polish, artificial nails, or any other type of covering on natural nails (fingers and toes) If you have artificial nails or gel coating that need to be removed by a nail salon, please have this removed prior to surgery. Artificial nails or gel coating may interfere with anesthesia's ability to adequately monitor your vital signs.             [facility] is not responsible for any belongings or valuables.  Do NOT Smoke (Tobacco/Vaping)  24 hours prior to your procedure  If you use a CPAP at night, you may bring your mask for your overnight stay.   Contacts, glasses, hearing aids, dentures or partials may not be worn into surgery, please bring cases for these belongings   For patients admitted to the hospital, discharge time will be determined by your treatment team.   Patients discharged the day of surgery will not be allowed to drive home, and someone needs to stay with them for 24 hours.  NO VISITORS WILL BE ALLOWED IN PRE-OP WHERE PATIENTS ARE PREPPED FOR SURGERY.  ONLY 1 SUPPORT PERSON MAY BE PRESENT IN THE WAITING ROOM WHILE YOU ARE IN SURGERY.  IF YOU ARE TO BE ADMITTED, ONCE YOU ARE IN YOUR ROOM YOU WILL BE ALLOWED TWO (2) VISITORS. 1 (ONE) VISITOR MAY STAY OVERNIGHT BUT MUST ARRIVE TO THE ROOM BY 8pm.  Minor children may have two parents present. Special consideration for safety and communication needs will be reviewed on a case by case basis.  Special instructions:    Oral Hygiene is also important to reduce your risk of infection.  Remember - BRUSH YOUR TEETH THE MORNING OF SURGERY WITH YOUR REGULAR TOOTHPASTE   [place]- Preparing For Surgery  Before surgery, you can play an important role. Because  skin is not sterile, your skin needs to be as free of germs as possible. You can reduce the number of germs on your skin by washing with CHG (chlorahexidine gluconate) Soap before surgery.  CHG is an antiseptic cleaner which kills germs and bonds with the skin to continue killing germs even after washing.     Please do not use if you have an allergy to CHG or antibacterial soaps. If your skin becomes reddened/irritated stop using the CHG.  Do not shave (including legs and underarms) for at least 48 hours prior to first CHG shower. It is OK to shave your face.  Please follow these instructions carefully.     Shower the NIGHT BEFORE SURGERY and the MORNING OF SURGERY with CHG Soap.   If you chose to wash  your hair, wash your hair first as usual with your normal shampoo. After you shampoo, rinse your hair and body thoroughly to remove the shampoo.  Then [employer] and genitals (private parts) with your normal soap and rinse thoroughly to remove soap.  After that Use CHG Soap as you would any other liquid soap. You can apply CHG directly to the skin and wash gently with a scrungie or a clean washcloth.   Apply the CHG Soap to your body ONLY FROM THE NECK DOWN.  Do not use on open wounds or open sores. Avoid contact with your eyes, ears, mouth and genitals (private parts). Wash Face and genitals (private parts)  with your normal soap.   Wash thoroughly, paying special attention to the area where your surgery will be performed.  Thoroughly rinse your body with warm water from the neck down.  DO NOT shower/wash with your normal soap after using and rinsing off the CHG Soap.  Pat yourself dry with a CLEAN TOWEL.  Wear CLEAN PAJAMAS to bed the night before surgery  Place CLEAN SHEETS on your bed the night before your surgery  DO NOT SLEEP WITH PETS.   Day of Surgery:  Take a shower with CHG soap. Wear Clean/Comfortable clothing the morning of surgery Do not apply any deodorants/lotions.    Remember to brush your teeth WITH YOUR REGULAR TOOTHPASTE.   Please read over the following fact sheets that you were given.

## 2021-11-23 ENCOUNTER — Telehealth: Payer: Self-pay | Admitting: *Deleted

## 2021-11-23 ENCOUNTER — Encounter: Payer: Self-pay | Admitting: *Deleted

## 2021-11-23 ENCOUNTER — Telehealth: Payer: Self-pay | Admitting: Hematology and Oncology

## 2021-11-23 DIAGNOSIS — C50412 Malignant neoplasm of upper-outer quadrant of left female breast: Secondary | ICD-10-CM | POA: Diagnosis not present

## 2021-11-23 NOTE — Telephone Encounter (Signed)
Spoke with patient to follow up from [facility] 1/25 and assess navigation needs. Patient denies any questions or concerns at this time. Encouraged her to call should anything arise. Patient verbalized understanding.

## 2021-11-23 NOTE — Telephone Encounter (Signed)
Spoke with patient to follow up from

## 2021-11-23 NOTE — Telephone Encounter (Signed)
Sch per 1/27 inbasket,left msg

## 2021-11-24 ENCOUNTER — Other Ambulatory Visit: Payer: Self-pay | Admitting: *Deleted

## 2021-11-24 ENCOUNTER — Ambulatory Visit (HOSPITAL_COMMUNITY): Payer: Medicare Other | Admitting: Physician Assistant

## 2021-11-24 ENCOUNTER — Other Ambulatory Visit: Payer: Self-pay

## 2021-11-24 ENCOUNTER — Encounter (HOSPITAL_COMMUNITY)
Admission: RE | Disposition: A | Discharge status: Home / Self Care | Payer: Self-pay | Source: Home / Self Care | Attending: General Surgery

## 2021-11-24 ENCOUNTER — Encounter (HOSPITAL_COMMUNITY): Payer: Self-pay | Admitting: General Surgery

## 2021-11-24 ENCOUNTER — Ambulatory Visit (HOSPITAL_COMMUNITY): Payer: Medicare Other | Admitting: Anesthesiology

## 2021-11-24 ENCOUNTER — Ambulatory Visit (HOSPITAL_COMMUNITY)
Admission: RE | Admit: 2021-11-24 | Discharge: 2021-11-24 | Disposition: A | Discharge status: Home / Self Care | Payer: Medicare Other | Attending: General Surgery | Admitting: General Surgery

## 2021-11-24 DIAGNOSIS — C50412 Malignant neoplasm of upper-outer quadrant of left female breast: Secondary | ICD-10-CM | POA: Diagnosis not present

## 2021-11-24 DIAGNOSIS — Z85828 Personal history of other malignant neoplasm of skin: Secondary | ICD-10-CM | POA: Insufficient documentation

## 2021-11-24 DIAGNOSIS — C50211 Malignant neoplasm of upper-inner quadrant of right female breast: Secondary | ICD-10-CM | POA: Insufficient documentation

## 2021-11-24 DIAGNOSIS — C50911 Malignant neoplasm of unspecified site of right female breast: Secondary | ICD-10-CM | POA: Diagnosis not present

## 2021-11-24 DIAGNOSIS — C50311 Malignant neoplasm of lower-inner quadrant of right female breast: Secondary | ICD-10-CM | POA: Diagnosis not present

## 2021-11-24 DIAGNOSIS — Z8042 Family history of malignant neoplasm of prostate: Secondary | ICD-10-CM | POA: Insufficient documentation

## 2021-11-24 DIAGNOSIS — Z8049 Family history of malignant neoplasm of other genital organs: Secondary | ICD-10-CM | POA: Diagnosis not present

## 2021-11-24 DIAGNOSIS — I10 Essential (primary) hypertension: Secondary | ICD-10-CM | POA: Diagnosis not present

## 2021-11-24 DIAGNOSIS — C50912 Malignant neoplasm of unspecified site of left female breast: Secondary | ICD-10-CM | POA: Diagnosis not present

## 2021-11-24 DIAGNOSIS — E119 Type 2 diabetes mellitus without complications: Secondary | ICD-10-CM | POA: Insufficient documentation

## 2021-11-24 DIAGNOSIS — Z170 Estrogen receptor positive status [ER+]: Secondary | ICD-10-CM

## 2021-11-24 HISTORY — PX: BREAST LUMPECTOMY WITH RADIOACTIVE SEED LOCALIZATION: SHX6424

## 2021-11-24 LAB — GLUCOSE, CAPILLARY: Glucose-Capillary: 97 mg/dL (ref 70–99)

## 2021-11-24 SURGERY — BREAST LUMPECTOMY WITH RADIOACTIVE SEED LOCALIZATION
Anesthesia: General | Site: Breast | Laterality: Bilateral

## 2021-11-24 MED ORDER — HYDROCODONE-ACETAMINOPHEN 5-325 MG PO TABS
ORAL_TABLET | ORAL | Status: AC
  Filled 2021-11-24: qty 1

## 2021-11-24 MED ORDER — CIPROFLOXACIN IN D5W 400 MG/200ML IV SOLN
INTRAVENOUS | Status: AC
  Filled 2021-11-24: qty 200

## 2021-11-24 MED ORDER — ACETAMINOPHEN 500 MG PO TABS: 1000.0000 mg | ORAL_TABLET | Freq: Once | ORAL | Status: AC

## 2021-11-24 MED ORDER — PROPOFOL 10 MG/ML IV BOLUS
INTRAVENOUS | Status: AC
  Filled 2021-11-24: qty 20

## 2021-11-24 MED ORDER — CHLORHEXIDINE GLUCONATE CLOTH 2 % EX PADS: 6.0000 | MEDICATED_PAD | Freq: Once | CUTANEOUS | Status: DC

## 2021-11-24 MED ORDER — FENTANYL CITRATE (PF) 100 MCG/2ML IJ SOLN
INTRAMUSCULAR | Status: DC | PRN
Start: 2021-11-24 — End: 2021-11-24
  Administered 2021-11-24: 50 ug via INTRAVENOUS
  Administered 2021-11-24 (×2): 25 ug via INTRAVENOUS

## 2021-11-24 MED ORDER — DEXAMETHASONE SODIUM PHOSPHATE 10 MG/ML IJ SOLN
INTRAMUSCULAR | Status: AC
  Filled 2021-11-24: qty 1

## 2021-11-24 MED ORDER — ONDANSETRON HCL 4 MG/2ML IJ SOLN
INTRAMUSCULAR | Status: AC
  Filled 2021-11-24: qty 2

## 2021-11-24 MED ORDER — LACTATED RINGERS IV SOLN: INTRAVENOUS | Status: DC

## 2021-11-24 MED ORDER — FENTANYL CITRATE (PF) 250 MCG/5ML IJ SOLN
INTRAMUSCULAR | Status: AC
  Filled 2021-11-24: qty 5

## 2021-11-24 MED ORDER — AMISULPRIDE (ANTIEMETIC) 5 MG/2ML IV SOLN: 10.0000 mg | Freq: Once | INTRAVENOUS | Status: DC | PRN

## 2021-11-24 MED ORDER — CHLORHEXIDINE GLUCONATE 0.12 % MT SOLN: 15.0000 mL | Freq: Once | OROMUCOSAL | Status: AC

## 2021-11-24 MED ORDER — PROMETHAZINE HCL 25 MG/ML IJ SOLN: 6.2500 mg | INTRAMUSCULAR | Status: DC | PRN

## 2021-11-24 MED ORDER — LIDOCAINE HCL (PF) 1 % IJ SOLN
INTRAMUSCULAR | Status: AC
  Filled 2021-11-24: qty 30

## 2021-11-24 MED ORDER — CIPROFLOXACIN IN D5W 400 MG/200ML IV SOLN
400.0000 mg | INTRAVENOUS | Status: AC
  Administered 2021-11-24: 400 mg via INTRAVENOUS

## 2021-11-24 MED ORDER — LIDOCAINE HCL (CARDIAC) PF 100 MG/5ML IV SOSY
PREFILLED_SYRINGE | INTRAVENOUS | Status: DC | PRN
  Administered 2021-11-24: 60 mg via INTRAVENOUS

## 2021-11-24 MED ORDER — PHENYLEPHRINE 40 MCG/ML (10ML) SYRINGE FOR IV PUSH (FOR BLOOD PRESSURE SUPPORT)
PREFILLED_SYRINGE | INTRAVENOUS | Status: DC | PRN
  Administered 2021-11-24: 40 ug via INTRAVENOUS
  Administered 2021-11-24 (×2): 80 ug via INTRAVENOUS
  Administered 2021-11-24: 40 ug via INTRAVENOUS

## 2021-11-24 MED ORDER — ENSURE PRE-SURGERY PO LIQD: 296.0000 mL | Freq: Once | ORAL | Status: DC

## 2021-11-24 MED ORDER — FENTANYL CITRATE (PF) 100 MCG/2ML IJ SOLN: 25.0000 ug | INTRAMUSCULAR | Status: DC | PRN

## 2021-11-24 MED ORDER — BUPIVACAINE-EPINEPHRINE (PF) 0.25% -1:200000 IJ SOLN
INTRAMUSCULAR | Status: AC
  Filled 2021-11-24: qty 30

## 2021-11-24 MED ORDER — ONDANSETRON HCL 4 MG/2ML IJ SOLN
INTRAMUSCULAR | Status: DC | PRN
  Administered 2021-11-24: 4 mg via INTRAVENOUS

## 2021-11-24 MED ORDER — CHLORHEXIDINE GLUCONATE 0.12 % MT SOLN
OROMUCOSAL | Status: AC
  Administered 2021-11-24: 15 mL via OROMUCOSAL
  Filled 2021-11-24: qty 15

## 2021-11-24 MED ORDER — PROPOFOL 10 MG/ML IV BOLUS
INTRAVENOUS | Status: DC | PRN
  Administered 2021-11-24: 110 mg via INTRAVENOUS

## 2021-11-24 MED ORDER — 0.9 % SODIUM CHLORIDE (POUR BTL) OPTIME
TOPICAL | Status: DC | PRN
  Administered 2021-11-24: 1000 mL

## 2021-11-24 MED ORDER — HYDROCODONE-ACETAMINOPHEN 5-325 MG PO TABS
1.0000 | ORAL_TABLET | Freq: Once | ORAL | Status: AC
  Administered 2021-11-24: 1 via ORAL

## 2021-11-24 MED ORDER — LIDOCAINE HCL 1 % IJ SOLN
INTRAMUSCULAR | Status: DC | PRN
  Administered 2021-11-24: 60 mL

## 2021-11-24 MED ORDER — HYDROCODONE-ACETAMINOPHEN 5-325 MG PO TABS: 1.0000 | ORAL_TABLET | Freq: Four times a day (QID) | ORAL | 0 refills | Status: DC | PRN

## 2021-11-24 MED ORDER — ACETAMINOPHEN 500 MG PO TABS
ORAL_TABLET | ORAL | Status: AC
  Administered 2021-11-24: 1000 mg via ORAL
  Filled 2021-11-24: qty 2

## 2021-11-24 MED ORDER — ACETAMINOPHEN 500 MG PO TABS: 1000.0000 mg | ORAL_TABLET | ORAL | Status: DC

## 2021-11-24 MED ORDER — ORAL CARE MOUTH RINSE: 15.0000 mL | Freq: Once | OROMUCOSAL | Status: AC

## 2021-11-24 MED ORDER — DEXAMETHASONE SODIUM PHOSPHATE 4 MG/ML IJ SOLN
INTRAMUSCULAR | Status: DC | PRN
Start: 2021-11-24 — End: 2021-11-24
  Administered 2021-11-24: 5 mg via INTRAVENOUS

## 2021-11-24 SURGICAL SUPPLY — 41 items
ADH SKN CLS APL DERMABOND .7 (GAUZE/BANDAGES/DRESSINGS) ×1
APL PRP STRL LF DISP 70% ISPRP (MISCELLANEOUS) ×1
BAG COUNTER SPONGE SURGICOUNT (BAG) ×2 IMPLANT
BAG SPNG CNTER NS LX DISP (BAG) ×1
BINDER BREAST XLRG (GAUZE/BANDAGES/DRESSINGS) ×1 IMPLANT
BLADE SURG 10 STRL SS (BLADE) ×2 IMPLANT
CANISTER SUCT 3000ML PPV (MISCELLANEOUS) IMPLANT
CHLORAPREP W/TINT 26 (MISCELLANEOUS) ×2 IMPLANT
CLIP VESOCCLUDE LG 6/CT (CLIP) ×2 IMPLANT
COVER PROBE W GEL 5X96 (DRAPES) ×2 IMPLANT
COVER SURGICAL LIGHT HANDLE (MISCELLANEOUS) ×2 IMPLANT
DERMABOND ADVANCED (GAUZE/BANDAGES/DRESSINGS) ×1
DERMABOND ADVANCED .7 DNX12 (GAUZE/BANDAGES/DRESSINGS) ×1 IMPLANT
DEVICE DUBIN SPECIMEN MAMMOGRA (MISCELLANEOUS) ×2 IMPLANT
DRAPE CHEST BREAST 15X10 FENES (DRAPES) ×2 IMPLANT
DRSG PAD ABDOMINAL 8X10 ST (GAUZE/BANDAGES/DRESSINGS) ×2 IMPLANT
ELECT COATED BLADE 2.86 ST (ELECTRODE) ×2 IMPLANT
ELECT REM PT RETURN 9FT ADLT (ELECTROSURGICAL) ×2
ELECTRODE REM PT RTRN 9FT ADLT (ELECTROSURGICAL) ×1 IMPLANT
GAUZE SPONGE 4X4 12PLY STRL LF (GAUZE/BANDAGES/DRESSINGS) ×2 IMPLANT
GLOVE SURG ENC MOIS LTX SZ6 (GLOVE) ×2 IMPLANT
GLOVE SURG UNDER LTX SZ6.5 (GLOVE) ×2 IMPLANT
GOWN STRL REUS W/ TWL LRG LVL3 (GOWN DISPOSABLE) ×1 IMPLANT
GOWN STRL REUS W/TWL 2XL LVL3 (GOWN DISPOSABLE) ×2 IMPLANT
GOWN STRL REUS W/TWL LRG LVL3 (GOWN DISPOSABLE) ×2
KIT BASIN OR (CUSTOM PROCEDURE TRAY) ×2 IMPLANT
KIT MARKER MARGIN INK (KITS) ×2 IMPLANT
LIGHT WAVEGUIDE WIDE FLAT (MISCELLANEOUS) ×1 IMPLANT
NDL HYPO 25GX1X1/2 BEV (NEEDLE) ×1 IMPLANT
NEEDLE HYPO 25GX1X1/2 BEV (NEEDLE) ×2 IMPLANT
NS IRRIG 1000ML POUR BTL (IV SOLUTION) ×1 IMPLANT
PACK GENERAL/GYN (CUSTOM PROCEDURE TRAY) ×2 IMPLANT
STRIP CLOSURE SKIN 1/2X4 (GAUZE/BANDAGES/DRESSINGS) ×2 IMPLANT
SUT MNCRL AB 4-0 PS2 18 (SUTURE) ×2 IMPLANT
SUT VIC AB 2-0 SH 27 (SUTURE) ×2
SUT VIC AB 2-0 SH 27XBRD (SUTURE) ×1 IMPLANT
SUT VIC AB 3-0 SH 27 (SUTURE) ×2
SUT VIC AB 3-0 SH 27X BRD (SUTURE) ×1 IMPLANT
SYR CONTROL 10ML LL (SYRINGE) ×2 IMPLANT
TOWEL GREEN STERILE (TOWEL DISPOSABLE) ×2 IMPLANT
TOWEL GREEN STERILE FF (TOWEL DISPOSABLE) ×2 IMPLANT

## 2021-11-24 NOTE — Op Note (Signed)
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 The risks, benefits, complications, treatment options, and expected outcomes were discussed with the patient. The possibilities of bleeding, infection, the need for additional procedures, failure to diagnose a condition, and creating a complication requiring other procedures or operations were discussed with the patient. The patient concurred with the proposed plan, giving informed consent.  The site of surgery properly noted/marked. The patient was taken to Operating Room # 7, identified, and the procedure verified as bilateral breast seed localized lumpectomy.  The bilateral breast and chest were prepped and draped in standard fashion. The left side was addressed first.  A transverse incision was made near the previously placed radioactive seed.  Dissection was carried down around the point of maximum signal intensity. The cautery was used to perform the dissection.   The specimen was inked with the margin marker paint kit.    Specimen radiography confirmed inclusion of the mammographic lesion, the clip, and the seed.  It appeared to be close to the medial margin, so an additional medial margin specimen was taken.  The background signal in the breast was zero.  Hemostasis was achieved with cautery.  The cavity was marked with clips on each border other than the anterior border.    The right side was addressed similarly but a medial circumareolar incision was made.  The seed and clip  appeared to be in the center of the specimen.  This tissue was rearranged a bit to minimize the defect present in the medial breast.    Local anesthetic was administered into the tissue surrounding the incisions.  The wounds were irrigated and closed with 3-0 vicryl interrupted deep dermal sutures and 4-0 monocryl running subcuticular suture.      Sterile dressings were applied. At the end of the operation, all sponge, instrument, and needle counts were correct.   Findings: Seed, clip in both specimens.  Both sides had anterior margin of skin and posterior margin as pectoralis.     Estimated Blood Loss:  min         Specimens: left breast tissue with seed         Complications:  None; patient tolerated the procedure well.         Disposition: PACU - hemodynamically stable.         Condition: stable

## 2021-11-24 NOTE — Anesthesia Preprocedure Evaluation (Addendum)
Anesthesia Evaluation  Patient identified by MRN, date of birth, ID band Patient awake    Reviewed: Allergy & Precautions, NPO status , Patient's Chart, lab work & pertinent test results  History of Anesthesia Complications Negative for: history of anesthetic complications  Airway Mallampati: II  TM Distance: >3 FB Neck ROM: Full    Dental no notable dental hx. (+) Dental Advisory Given   Pulmonary sleep apnea and Continuous Positive Airway Pressure Ventilation ,    Pulmonary exam normal        Cardiovascular hypertension, Pt. on medications Normal cardiovascular exam  Study Conclusions   - Left ventricle: The cavity size was normal. Wall thickness was  normal. Systolic function was normal. The estimated ejection  fraction was in the range of 55% to 60%. Wall motion was normal;  there were no regional wall motion abnormalities. Left  ventricular diastolic function parameters were normal.  - Left atrium: The atrium was mildly dilated.    Neuro/Psych CVA    GI/Hepatic Neg liver ROS, GERD  Medicated,  Endo/Other  diabetes  Renal/GU negative Renal ROS     Musculoskeletal negative musculoskeletal ROS (+)   Abdominal   Peds  Hematology negative hematology ROS (+) anemia ,   Anesthesia Other Findings   Reproductive/Obstetrics                            Anesthesia Physical Anesthesia Plan  ASA: 3  Anesthesia Plan: General   Post-op Pain Management: Tylenol PO (pre-op)   Induction:   PONV Risk Score and Plan: 4 or greater and Ondansetron, Dexamethasone and Diphenhydramine  Airway Management Planned: LMA  Additional Equipment:   Intra-op Plan:   Post-operative Plan: Extubation in OR  Informed Consent: I have reviewed the patients History and Physical, chart, labs and discussed the procedure including the risks, benefits and alternatives for the proposed anesthesia with the  patient or authorized representative who has indicated his/her understanding and acceptance.     Dental advisory given  Plan Discussed with: Anesthesiologist and CRNA  Anesthesia Plan Comments:       Anesthesia Quick Evaluation

## 2021-11-24 NOTE — Anesthesia Procedure Notes (Signed)
Procedure Name: LMA Insertion Date/Time: 11/24/2021 2:05 PM Performed by: Oletta Lamas, CRNA Pre-anesthesia Checklist: Patient identified, Emergency Drugs available, Suction available, Patient being monitored and Timeout performed Patient Re-evaluated:Patient Re-evaluated prior to induction Oxygen Delivery Method: Circle system utilized Preoxygenation: Pre-oxygenation with 100% oxygen Induction Type: IV induction LMA: LMA inserted LMA Size: 4.0 Placement Confirmation: positive ETCO2 and breath sounds checked- equal and bilateral Tube secured with: Tape Dental Injury: Teeth and Oropharynx as per pre-operative assessment

## 2021-11-24 NOTE — H&P (Signed)
REFERRING PHYSICIAN: Robbi Garter, MD  PROVIDER: Georgianne Fick, MD  Care Team: Patient Care Team: Olen Pel, MD as PCP - General (Family Medicine) Georgianne Fick, MD as Consulting Provider (Surgical Oncology) Marye Round, MD (Radiation Oncology) Iruku, Flo Shanks, MD (Hematology and Oncology) Dohmeier, Asencion Partridge, MD (Neurology) Claudina Lick, MD (Radiology)   MRN: [id number] DOB: 11-22-1940 DATE OF ENCOUNTER: 11/16/2021  Subjective   Chief Complaint: Breast Cancer   History of Present Illness: Tracey Richard is a 81 y.o. female who is seen today as an office consultation at the request of Dr. Truman Hayward for evaluation of Breast Cancer  Pt is a lovely 81 yo F who is diagnosed with bilateral breast cancer 10/2021. She had screening detected masses bilaterally. She was found to have a 2 cm spiculated mass on the right in the upper inner quadrant (2 o'clock). The left side was a 5 mm mass at 2 o'clock as well. 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 The prognostic panel on the LEFT showed +/+/pending, Ki 67 1%. The right side isn't back yet.   She has no prior history of cancer before this dx other than basal cell skin cancer. She had family history of prostate cancer in her father and vaginal cancer in her mother. She is nulliparous. Menarche was age 65. She is unsure when menopause occurred. She did not use HRT or hormonal contraception.   She is accompanied by Dr. Josie Dixon sister in law as a family friend and her gentleman friend, Elesa Hacker. Of note, I did an office procedure on him and Dr. Marcello Moores operated on him.   Diagnostic mammogram/us: [facility] 10/28/21.  RIGHT - 1.8-2 cm mass lower inner quadrant irregular high density.  LEFT - two areas of focal asymmetry in the upper inner (6 mm) and upper outer (5 mm) Axilla negative bilaterally  Pathology core needle biopsy:  RIGHT: invasive mammary carcinoma  (negative e cadherin for lobular), grade 1-2.  Receptors pending. **  LEFT: invasive mammary carcinoma (negative e cadherin for lobular), grade 1-2. Receptors: ER 100% strong, PR 100% strong, Ki 67 1%  Review of Systems: A complete review of systems was obtained from the patient. I have reviewed this information and discussed as appropriate with the patient. See HPI as well for other ROS.  Review of Systems  Respiratory: Positive for shortness of breath (with stairs).  Musculoskeletal: Positive for back pain.  All other systems reviewed and are negative.   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)   Dyslipidemia   Past Surgical History:  Procedure Laterality Date   APPENDECTOMY N/A  1963   Back Surgery N/A  2007   heart surgery   TONSILLECTOMY    Allergies  Allergen Reactions   Penicillins Other (See Comments)  Unknown, from childhood Childhood    Adhesive Tape-Silicones Itching   Oxycodone Hcl Itching and Other (See Comments)   Sulfa (Sulfonamide Antibiotics) Other (See Comments)  Unknown, from childhood   Current Outpatient Medications on File Prior to Visit  Medication Sig Dispense  Refill   aspirin 81 MG EC tablet Take 81 mg by mouth once daily   calcium carbonate-vitamin D3 (CALTRATE 600+D) 600 mg-10 mcg (400 unit) tablet Take 1 tablet by mouth 2 (two) times daily with meals   fluticasone propion-salmeteroL (ADVAIR DISKUS) 250-50 mcg/dose diskus inhaler Inhale 1 inhalation into the lungs every 12 (twelve) hours   imipramine (TOFRANIL) 25 MG tablet Take 25 mg by mouth at bedtime    simvastatin (ZOCOR) 40 MG tablet Take 40 mg by mouth at bedtime   No current facility-administered medications on file prior to visit.   Family History  Problem Relation Age of Onset   High blood pressure (Hypertension) Mother   Diabetes Mother   Diabetes Brother   Breast cancer Paternal Aunt    Social History   Tobacco Use  Smoking Status Never  Smokeless Tobacco Never    Social History   Socioeconomic History   Marital status: Unknown  Tobacco Use   Smoking status: Never   Smokeless tobacco: Never  Vaping Use   Vaping Use: Never used  Substance and Sexual Activity   Alcohol use: Never   Drug use: Never   Objective:   Vitals:  11/16/21 1158  BP: (!) 147/69  Pulse: 83  Resp: 18  Temp: 36.7 C (98.1 F)  Weight: 78 kg (172 lb)  Height: 160 cm (5\' 3" )   Body mass index is 30.47 kg/m.  Gen: No acute distress. Well nourished and well groomed.  Neurological: Alert and oriented to person, place, and time. Coordination normal.  Head: Normocephalic and atraumatic.  Eyes: Conjunctivae are normal. Pupils are equal, round, and reactive to light. No scleral icterus.  Neck: Normal range of motion. Neck supple. No tracheal deviation or thyromegaly present.  Cardiovascular: Normal rate, regular rhythm, normal heart sounds and intact distal pulses. Exam reveals no gallop and no friction rub. No murmur heard. Breast: relatively symmetric in size. No palpable masses. Hematomas bilaterally. No skin dimpling. No nipple discharge. No nipple retraction. No LAD.  Respiratory: Effort normal. No respiratory distress. No chest wall tenderness. Breath sounds normal. No wheezes, rales or rhonchi.  GI: Soft. Bowel sounds are normal. The abdomen is soft and nontender. There is no rebound and no guarding.  Musculoskeletal: Normal range of motion. Extremities are nontender.  Lymphadenopathy: No cervical, preauricular, postauricular or axillary adenopathy is present Skin: Skin is warm and  dry. No rash noted. No diaphoresis. No erythema. No pallor. No clubbing, cyanosis, or edema.  Psychiatric: Normal mood and affect. Behavior is normal. Judgment and thought content normal.   Labs CBC and CMET 11/16/21 near normal. Sl anemia with HCT 35.6. Cr very mildly elevated at 1.05.   Assessment and Plan:   Malignant neoplasm of upper-inner quadrant of right female breast (CMS-HCC) Pt has a new diagnosis of cT1cN0 right breast cancer. This is likely hormone positive, but receptors are pending. Given age and other prognostic panel will omit SLN bx. Will plan seed localized lumpectomy. This will be followed by possible radiation and/or antihormonal tx.   The surgical procedure was described to the patient. I discussed the incision type and location and that we would need radiology involved on with a wire or seed marker and/or sentinel node.   The risks and benefits of the procedure were described to the patient and she wishes to proceed.   We discussed the risks bleeding, infection, damage to other structures, need for further procedures/surgeries. We discussed the risk of seroma. The patient was advised if  the area in the breast in cancer, we may need to go back to surgery for additional tissue to obtain negative margins or for a lymph node biopsy. The patient was advised that these are the most common complications, but that others can occur as well. They were advised against taking aspirin or other anti-inflammatory agents/blood thinners the week before surgery.   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 This is the smaller of the two cancers.   Given bilateral cancers, patient received genetic counseling and testing was sent today.

## 2021-11-24 NOTE — Interval H&P Note (Signed)
History and Physical Interval Note:  11/24/2021 1:47 PM  Tracey Richard  has presented today for surgery, with the diagnosis of BILATERAL BREAST CANCER.  The various methods of treatment have been discussed with the patient and family. After consideration of risks, benefits and other options for treatment, the patient has consented to  Procedure(s): BILATERAL BREAST LUMPECTOMY WITH RADIOACTIVE SEED LOCALIZATION (Bilateral) as a surgical intervention.  The patient's history has been reviewed, patient examined, no change in status, stable for surgery.  I have reviewed the patient's chart and labs.  Questions were answered to the patient's satisfaction.     Stark Klein

## 2021-11-24 NOTE — Discharge Instructions (Addendum)
[facility] Office Phone Number [phone]  BREAST BIOPSY/ PARTIAL MASTECTOMY: POST OP INSTRUCTIONS  Always review your discharge instruction sheet given to you by the facility where your surgery was performed.  IF YOU HAVE DISABILITY OR FAMILY LEAVE FORMS, YOU MUST BRING THEM TO THE OFFICE FOR PROCESSING.  DO NOT GIVE THEM TO YOUR DOCTOR.  A prescription for pain medication may be given to you upon discharge.  Take your pain medication as prescribed, if needed.  If narcotic pain medicine is not needed, then you may take acetaminophen (Tylenol) or ibuprofen (Advil) as needed. Take your usually prescribed medications unless otherwise directed If you need a refill on your pain medication, please contact your pharmacy.  They will contact our office to request authorization.  Prescriptions will not be filled after 5pm or on week-ends. You should eat very light the first 24 hours after surgery, such as soup, crackers, pudding, etc.  Resume your normal diet the day after surgery. Most patients will experience some swelling and bruising in the breast.  Ice packs and a good support bra will help.  Swelling and bruising can take several days to resolve.  It is common to experience some constipation if taking pain medication after surgery.  Increasing fluid intake and taking a stool softener will usually help or prevent this problem from occurring.  A mild laxative (Milk of Magnesia or Miralax) should be taken according to package directions if there are no bowel movements after 48 hours. Unless discharge instructions indicate otherwise, you may remove your bandages 48 hours after surgery, and you may shower at that time.  You may have steri-strips (small skin tapes) in place directly over the incision.  These strips should be left on the skin for 7-10 days.   Any sutures or staples will be removed at the office during your follow-up visit. ACTIVITIES:  You may resume regular daily activities  (gradually increasing) beginning the next day.  Wearing a good support bra or sports bra (or the breast binder) minimizes pain and swelling.  You may have sexual intercourse when it is comfortable. You may drive when you no longer are taking prescription pain medication, you can comfortably wear a seatbelt, and you can safely maneuver your car and apply brakes. RETURN TO WORK:  __________1 week_______________ You should see your doctor in the office for a follow-up appointment approximately two weeks after your surgery.  Your doctor's nurse will typically make your follow-up appointment when she calls you with your pathology report.  Expect your pathology report 2-3 business days after your surgery.  You may call to check if you do not hear from us after three days.   WHEN TO CALL YOUR DOCTOR: Fever over 101.0 Nausea and/or vomiting. Extreme swelling or bruising. Continued bleeding from incision. Increased pain, redness, or drainage from the incision.  The clinic staff is available to answer your questions during regular business hours.  Please don't hesitate to call and ask to speak to one of the nurses for clinical concerns.  If you have a medical emergency, go to the nearest emergency room or call 911.  A surgeon from [facility] is always on call at the hospital.  For further questions, please visit [email or web address]   

## 2021-11-24 NOTE — Anesthesia Postprocedure Evaluation (Signed)
Anesthesia Post Note  Patient: Tracey Richard  Procedure(s) Performed: BILATERAL BREAST LUMPECTOMY WITH RADIOACTIVE SEED LOCALIZATION (Bilateral: Breast)     Patient location during evaluation: PACU Anesthesia Type: General Level of consciousness: awake and alert Pain management: pain level controlled Vital Signs Assessment: post-procedure vital signs reviewed and stable Respiratory status: spontaneous breathing, nonlabored ventilation, respiratory function stable and patient connected to nasal cannula oxygen Cardiovascular status: blood pressure returned to baseline and stable Postop Assessment: no apparent nausea or vomiting Anesthetic complications: no   No notable events documented.  Last Vitals:  Vitals:   11/24/21 1605 11/24/21 1620  BP: (!) 146/81 (!) 149/82  Pulse: 67 70  Resp: 16 18  Temp:    SpO2: 94% 97%    Last Pain:  Vitals:   11/24/21 1620  PainSc: 3                  Kayli Beal

## 2021-11-24 NOTE — Transfer of Care (Signed)
Immediate Anesthesia Transfer of Care Note  Patient: Tracey Richard  Procedure(s) Performed: BILATERAL BREAST LUMPECTOMY WITH RADIOACTIVE SEED LOCALIZATION (Bilateral: Breast)  Patient Location: PACU  Anesthesia Type:General  Level of Consciousness: drowsy  Airway & Oxygen Therapy: Patient Spontanous Breathing and Patient connected to face mask oxygen  Post-op Assessment: Report given to RN and Post -op Vital signs reviewed and stable  Post vital signs: Reviewed and stable  Last Vitals:  Vitals Value Taken Time  BP 152/79 11/24/21 1551  Temp    Pulse 64 11/24/21 1552  Resp 16 11/24/21 1552  SpO2 100 % 11/24/21 1552  Vitals shown include unvalidated device data.  Last Pain:  Vitals:   11/24/21 1339  PainSc: 0-No pain         Complications: No notable events documented.

## 2021-11-25 ENCOUNTER — Encounter (HOSPITAL_COMMUNITY): Payer: Self-pay | Admitting: General Surgery

## 2021-11-29 ENCOUNTER — Ambulatory Visit
Admission: RE | Admit: 2021-11-29 | Discharge: 2021-11-29 | Disposition: A | Discharge status: Home / Self Care | Payer: Self-pay | Source: Ambulatory Visit | Attending: Radiation Oncology | Admitting: Radiation Oncology

## 2021-11-29 ENCOUNTER — Other Ambulatory Visit: Payer: Self-pay | Admitting: Radiation Oncology

## 2021-11-29 ENCOUNTER — Inpatient Hospital Stay
Admission: RE | Admit: 2021-11-29 | Discharge: 2021-11-29 | Disposition: A | Discharge status: Home / Self Care | Payer: Self-pay | Source: Ambulatory Visit | Attending: Radiation Oncology | Admitting: Radiation Oncology

## 2021-11-29 DIAGNOSIS — C50211 Malignant neoplasm of upper-inner quadrant of right female breast: Secondary | ICD-10-CM

## 2021-11-29 DIAGNOSIS — C50412 Malignant neoplasm of upper-outer quadrant of left female breast: Secondary | ICD-10-CM

## 2021-11-29 LAB — SURGICAL PATHOLOGY

## 2021-12-01 ENCOUNTER — Ambulatory Visit: Payer: Self-pay | Admitting: Genetic Counselor

## 2021-12-01 ENCOUNTER — Encounter: Payer: Self-pay | Admitting: Genetic Counselor

## 2021-12-01 ENCOUNTER — Telehealth: Payer: Self-pay | Admitting: Genetic Counselor

## 2021-12-01 DIAGNOSIS — Z170 Estrogen receptor positive status [ER+]: Secondary | ICD-10-CM

## 2021-12-01 DIAGNOSIS — Z1379 Encounter for other screening for genetic and chromosomal anomalies: Secondary | ICD-10-CM | POA: Insufficient documentation

## 2021-12-01 DIAGNOSIS — C50211 Malignant neoplasm of upper-inner quadrant of right female breast: Secondary | ICD-10-CM

## 2021-12-01 DIAGNOSIS — Z803 Family history of malignant neoplasm of breast: Secondary | ICD-10-CM

## 2021-12-01 DIAGNOSIS — C50412 Malignant neoplasm of upper-outer quadrant of left female breast: Secondary | ICD-10-CM

## 2021-12-01 DIAGNOSIS — Z8042 Family history of malignant neoplasm of prostate: Secondary | ICD-10-CM

## 2021-12-01 NOTE — Progress Notes (Signed)
HPI:   Ms. Ponti was previously seen in the [facility] clinic due to a personal and family history of cancer and concerns regarding a hereditary predisposition to cancer. Please refer to our prior cancer genetics clinic note for more information regarding our discussion, assessment and recommendations, at the time. Ms. Tash recent genetic test results were disclosed to her, as were recommendations warranted by these results. These results and recommendations are discussed in more detail below.  CANCER HISTORY:  Oncology History Overview Note   10/18/2021, screening mammogram  Developing asymmetry in right breast lower inner quadrant anterior depth which is indeterminate. Additional views with [place] recommended. Asymmetry in left breast posterior depth central to nipple is indeterminate.  Diagnostic mammogram 2 * 1.3 cms irregular high density mass in right breast lower inner aspect anterior depth is indeterminate. [place] recommended.  Focal asymmetry in the left breast upper outer aspect posterior depth is indeterminate, ultrasound recommended.  Focal asymmetry in the left breast upper inner aspect middle depth is indeterminate, ultrasound recommended.  Ultrasound breast bilateral on 10/28/2021 showed 1.8 cm x 9 mm x 1.7 cm lobulated mass in the right breast upper inner aspect anterior depth highly suggestive of malignancy.  I believe the 9 cm reported in the radiology report could be dictation errors.  Ultrasound-guided biopsy was recommended. 0.5 x 0.5 x 0.4 cm lobulated mass in the left breast upper outer aspect posterior depth is suspicious of malignancy.  Ultrasound-guided biopsy is recommended 0.6 cm x 0.4 cm x 0.5 cm mass in the left breast upper inner aspect middle depth suspicious of malignancy, ultrasound-guided biopsy is recommended.  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  We did not have any prognostics on the right breast mass.  We will need to pathology at breast etc. number this is what they usually answer so I will call him back possible thinking  I reviewed her records extensively and collaborated the history with the patient.    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  The report date is 11/30/2021.   The CustomNext-Cancer+RNAinsight panel offered by [facility] includes sequencing and rearrangement analysis for the following 47 genes:  APC, ATM, AXIN2, BARD1, BMPR1A, BRCA1, BRCA2, BRIP1, CDH1, CDK4, CDKN2A, CHEK2, DICER1, EPCAM, GREM1, HOXB13, MEN1, MLH1, MSH2, MSH3, MSH6, MUTYH, NBN, NF1, NF2, NTHL1, PALB2, PMS2, POLD1, POLE, PTEN, RAD51C, RAD51D, RECQL, RET, SDHA, SDHAF2, SDHB, SDHC, SDHD, SMAD4, SMARCA4, STK11, TP53, TSC1, TSC2, and VHL.  RNA data is routinely analyzed for use in variant interpretation for all genes.   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  The report date is 11/30/2021.   The CustomNext-Cancer+RNAinsight panel offered by [facility] includes sequencing and rearrangement analysis for the following 47 genes:  APC, ATM, AXIN2, BARD1, BMPR1A, BRCA1, BRCA2, BRIP1, CDH1, CDK4, CDKN2A, CHEK2, DICER1, EPCAM, GREM1, HOXB13, MEN1, MLH1, MSH2, MSH3, MSH6, MUTYH, NBN, NF1, NF2, NTHL1, PALB2, PMS2, POLD1, POLE, PTEN, RAD51C, RAD51D, RECQL, RET, SDHA, SDHAF2, SDHB, SDHC, SDHD, SMAD4, SMARCA4, STK11, TP53, TSC1, TSC2, and VHL.  RNA data is routinely analyzed for use in variant interpretation for all genes.     FAMILY HISTORY:  We obtained a detailed, 4-generation family history.  Significant diagnoses are listed below: Family History  Problem Relation Age of Onset   Vaginal cancer Mother        d. 90   Prostate cancer Father        d. 65   Lung cancer Paternal Grandmother        dx after 5   Breast cancer Other        PGF's sister; dx after 32    Ms. Ponti is unaware of previous family history of genetic testing for hereditary cancer risks. There is no reported Ashkenazi Jewish ancestry. There is no known consanguinity.    GENETIC TEST RESULTS:  The [facility] CustomNext-Cancer +RNAinsight Panel found no pathogenic mutations. The CustomNext-Cancer+RNAinsight panel offered by [facility] includes sequencing and rearrangement analysis for the following 47 genes:  APC, ATM, AXIN2, BARD1, BMPR1A, BRCA1, BRCA2, BRIP1, CDH1, CDK4, CDKN2A, CHEK2, DICER1, EPCAM, GREM1, HOXB13, MEN1, MLH1, MSH2, MSH3, MSH6, MUTYH, NBN, NF1, NF2, NTHL1, PALB2, PMS2, POLD1, POLE, PTEN, RAD51C, RAD51D, RECQL, RET, SDHA, SDHAF2, SDHB, SDHC, SDHD, SMAD4, SMARCA4, STK11, TP53, TSC1, TSC2, and VHL.  RNA data is routinely analyzed for use in variant interpretation  for all genes.  The test report has been scanned into EPIC and is located under the Molecular Pathology section of the Results Review tab.  A portion of the result report is included below for reference. Genetic testing reported out on November 30, 2021.       Even though a pathogenic variant was not identified, possible explanations for the cancer in the family may include: There may be no hereditary risk for cancer in the family. The cancers in Ms. Amey and/or her family may be sporadic/familial or due to other genetic and environmental factors. There may be a gene mutation in one of these genes that current testing methods cannot detect but that chance is small. There could be another gene that has not yet been discovered, or that we have not yet tested, that is responsible for the cancer diagnoses in the family.  It is also possible there is a hereditary cause for the cancer in the family that Ms. Obryan did not inherit.   Therefore, it is important to remain in touch with cancer genetics in the future so that we can continue to offer Ms. Fildes the most up to date genetic testing.    ADDITIONAL GENETIC TESTING:  We discussed with Ms. Spieth that her genetic testing was fairly extensive.  If there are genes identified to increase cancer risk that can be analyzed in the future, we would be happy to discuss and coordinate this testing at that time.  CANCER SCREENING RECOMMENDATIONS:  Ms. Riggio test result is considered negative (normal).  This means that we have not identified a hereditary cause for her personal history of breast cancer at this time.   An individual's cancer risk and medical management are not determined by genetic test results alone. Overall cancer risk assessment incorporates additional factors, including personal medical history, family history, and any available genetic information that may result in a personalized plan for cancer prevention  and surveillance. Therefore, it is recommended she continue to follow the cancer management and screening guidelines provided by her oncology and primary healthcare provider.   RECOMMENDATIONS FOR FAMILY MEMBERS:   Individuals in this family might be at some increased risk of developing cancer, over the general population risk, due to the family history of cancer.  Individuals in the family should notify their providers of the family history of cancer. We recommend women in this family have a yearly mammogram beginning at age 8, or 93 years younger than the earliest onset of cancer, an annual clinical breast exam, and perform monthly breast self-exams.    FOLLOW-UP:  Lastly, we discussed with Ms. Sheller that cancer genetics is a rapidly advancing field and it is possible that new genetic tests will be appropriate for her and/or her family members in the future. We encouraged her to remain in contact with cancer genetics on an annual basis so we can update her personal and family histories and let her know of advances in cancer genetics that may benefit this family.   Our contact number was provided. Ms. Gallier's questions were answered to her satisfaction, and she knows she is welcome to call us at anytime with additional questions or concerns.   Miski Feldpausch M. Joette Catching, [place], [facility] Genetic Counselor [email or web address] (P) [phone]

## 2021-12-01 NOTE — Telephone Encounter (Signed)
Revealed negative genetic testing.  Discussed that we do not know why she has breast cancer or why there is cancer in the family. It could be sporadic/familial, due to a different gene that we are not testing, or maybe our current technology may not be able to pick something up.  It will be important for her to keep in contact with genetics to keep up with whether additional testing may be needed.   

## 2021-12-02 ENCOUNTER — Encounter: Payer: Self-pay | Admitting: *Deleted

## 2021-12-07 ENCOUNTER — Encounter: Payer: Self-pay | Admitting: Adult Health

## 2021-12-07 ENCOUNTER — Ambulatory Visit: Payer: Medicare Other | Admitting: Adult Health

## 2021-12-07 VITALS — BP 117/61 | HR 71 | Ht 63.0 in | Wt 167.4 lb

## 2021-12-07 DIAGNOSIS — G4733 Obstructive sleep apnea (adult) (pediatric): Secondary | ICD-10-CM | POA: Diagnosis not present

## 2021-12-07 DIAGNOSIS — Z9989 Dependence on other enabling machines and devices: Secondary | ICD-10-CM

## 2021-12-07 NOTE — Patient Instructions (Signed)
Continue using CPAP nightly and greater than 4 hours each night °If your symptoms worsen or you develop new symptoms please let us know.  ° °

## 2021-12-07 NOTE — Progress Notes (Signed)
PATIENT: Tracey Richard DOB: 1941-02-11  REASON FOR VISIT: follow up HISTORY FROM: patient PRIMARY NEUROLOGIST: Dr. Brett Fairy  HISTORY OF PRESENT ILLNESS: Today 12/07/21: Tracey Richard is a 81 year old female with a history of OSA on CPAP. Reports CPAP is working well. Patient had  Breast cancer surgery in February. Will meet will oncologist tomorrow.    HISTORY  12/06/20:   Tracey Richard is a 81 year old female with a history of obstructive sleep apnea on CPAP.  She returns today for follow-up.  She reports that the CPAP is still working well for her.  She denies any new issues.  Returns today for evaluation.   Compliance Report:   Compliance Usage 11/06/2020 - 12/05/2020 Usage days 30/30 days (100%) >= 4 hours 26 days (87%) Average usage (days used) 6 hours 24 minutes   AirSense 10 AutoSet For Her Serial number [id number] Mode AutoSet for Her Min Pressure 5 cmH2O Max Pressure 15 cmH2O EPR Fulltime EPR level 3   Therapy Pressure - cmH2O Median: 10.5 95th percentile: 13.2 Maximum: 13.9 Leaks - L/min Median: 4.0 95th percentile: 17.1 Maximum: 29.9 Events per hour AI: 2.2 HI: 0.4 AHI: 2.6 Apnea Index Central: 0.3 Obstructive: 1.5 Unknown: 0.3    REVIEW OF SYSTEMS: Out of a complete 14 system review of symptoms, the patient complains only of the following symptoms, and all other reviewed systems are negative.   ESS 10  ALLERGIES: Allergies  Allergen Reactions   Elemental Sulfur Other (See Comments)    Childhood    Penicillins Other (See Comments)    Childhood    Oxycodone Hcl Itching   Tape Itching    HOME MEDICATIONS: Outpatient Medications Prior to Visit  Medication Sig Dispense Refill   acetaminophen (TYLENOL) 500 MG tablet Take 500-1,000 mg by mouth every 6 (six) hours as needed (pain.).     Ascorbic Acid (VITAMIN C) 1000 MG tablet Take 1,000 mg by mouth daily at 4 PM.     aspirin EC 81 MG tablet Take 81 mg by mouth daily.     Calcium  Carb-Cholecalciferol (CALCIUM + D3) 600-800 MG-UNIT TABS Take 1 tablet by mouth daily after breakfast.     HYDROcodone-acetaminophen (NORCO/VICODIN) 5-325 MG tablet Take 1 tablet by mouth every 6 (six) hours as needed for moderate pain. 5 tablet 0   hydroxypropyl methylcellulose / hypromellose (ISOPTO TEARS / GONIOVISC) 2.5 % ophthalmic solution Place 1-2 drops into both eyes 3 (three) times daily as needed for dry eyes.     imipramine (TOFRANIL) 25 MG tablet TAKE 3 TABLETS BY MOUTH AT BEDTIME (Patient taking differently: Take 25 mg by mouth at bedtime.) 270 tablet 3   losartan (COZAAR) 50 MG tablet Take 50 mg by mouth at bedtime.     Multiple Vitamins-Minerals (ZINC PO) Take 50 mg by mouth daily at 4 PM.     polyethylene glycol (MIRALAX / GLYCOLAX) 17 g packet Take 17 g by mouth daily at 4 PM.     simvastatin (ZOCOR) 40 MG tablet TAKE 1 TABLET (40 MG TOTAL) BY MOUTH DAILY AT BEDTIME 90 tablet 3   Wheat Dextrin (BENEFIBER DRINK MIX PO) Take 1 Dose by mouth daily at 4 PM. 2 teaspoons     esomeprazole (NEXIUM) 20 MG capsule Take 20 mg by mouth daily as needed (acid reflux/indigestion).     No facility-administered medications prior to visit.    PAST MEDICAL HISTORY: Past Medical History:  Diagnosis Date   Allergy    Cancer (Tracey Richard)  Cataract    Chronic kidney disease    GERD (gastroesophageal reflux disease)    Hyperlipidemia    Hypertension    Pre-diabetes    Sleep apnea    Stroke ([place])    ruptured aneurysm    PAST SURGICAL HISTORY: Past Surgical History:  Procedure Laterality Date   APPENDECTOMY  1963   BACK SURGERY  2007   BREAST LUMPECTOMY WITH RADIOACTIVE SEED LOCALIZATION Bilateral 11/24/2021   Procedure: BILATERAL BREAST LUMPECTOMY WITH RADIOACTIVE SEED LOCALIZATION;  Surgeon: Stark Klein, MD;  Location: [facility];  Service: General;  Laterality: Bilateral;   BREAST SURGERY     DILATION AND CURETTAGE OF UTERUS     EYE SURGERY  1999   bilateral cataract extraction   moles   [place]    FAMILY HISTORY: Family History  Problem Relation Age of Onset   Vaginal cancer Mother        d. 1   Hypertension Mother    Prostate cancer Father        d. 35   Hypertension Father    Dementia Father    Diabetes Brother    Lung cancer Paternal Grandmother        dx after 57   Breast cancer Other        PGF's sister; dx after 72   Sleep apnea Neg Hx     SOCIAL HISTORY: Social History   Socioeconomic History   Marital status: Single    Spouse name: Not on file   Number of children: Not on file   Years of education: Not on file   Highest education level: Not on file  Occupational History   Occupation: retired  Tobacco Use   Smoking status: Never   Smokeless tobacco: Never  Vaping Use   Vaping Use: Never used  Substance and Sexual Activity   Alcohol use: Yes    Alcohol/week: 1.0 standard drink    Types: 1 Glasses of wine per week    Comment: 1 every 6 mts    Drug use: No   Sexual activity: Never  Other Topics Concern   Not on file  Social History Narrative   Walks daily. Divorced. Education: [employer].      Drinks 4 cups of caffeine daily.   Social Determinants of Health   Financial Resource Strain: Low Risk    Difficulty of Paying Living Expenses: Not hard at all  Food Insecurity: No Food Insecurity   Worried About Charity fundraiser in the Last Year: Never true   [place] in the Last Year: Never true  Transportation Needs: No Transportation Needs   Lack of Transportation (Medical): No   Lack of Transportation (Non-Medical): No  Physical Activity: Not on file  Stress: Not on file  Social Connections: Not on file  Intimate Partner Violence: Not on file      PHYSICAL EXAM  Vitals:   12/07/21 1335  BP: 117/61  Pulse: 71  Weight: 167 lb 6.4 oz (75.9 kg)  Height: 5\' 3"  (1.6 m)   Body mass index is 29.65 kg/m.  Generalized: Well developed, in no acute distress  Chest: Lungs clear to  auscultation bilaterally  Neurological examination  Mentation: Alert oriented to time, place, history taking. Follows all commands speech and language fluent Cranial nerve II-XII: Extraocular movements were full, visual field were full on confrontational test Head turning and shoulder shrug  were normal and symmetric.  Motor: The motor testing reveals 5 over 5 strength of all 4 extremities. Good symmetric motor tone is noted throughout.  Sensory: Sensory testing is intact to soft touch on all 4 extremities. No evidence of extinction is noted.  Gait and station: Gait is normal.    DIAGNOSTIC DATA (LABS, IMAGING, TESTING) - I reviewed patient records, labs, notes, testing and imaging myself where available.  Lab Results  Component Value Date   WBC 8.0 11/16/2021   HGB 11.7 (L) 11/16/2021   HCT 35.6 (L) 11/16/2021   MCV 88.6 11/16/2021   PLT 248 11/16/2021      Component Value Date/Time   NA 140 11/16/2021 0833   K 3.7 11/16/2021 0833   CL 106 11/16/2021 0833   CO2 26 11/16/2021 0833   GLUCOSE 132 (H) 11/16/2021 0833   BUN 21 11/16/2021 0833   CREATININE 1.05 (H) 11/16/2021 0833   CREATININE 0.98 (H) 07/03/2016 0835   CALCIUM 9.6 11/16/2021 0833   PROT 7.1 11/16/2021 0833   ALBUMIN 4.2 11/16/2021 0833   AST 18 11/16/2021 0833   ALT 17 11/16/2021 0833   ALKPHOS 51 11/16/2021 0833   BILITOT 0.6 11/16/2021 0833   GFRNONAA 54 (L) 11/16/2021 0833   GFRNONAA 57 (L) 07/03/2016 0835   GFRAA 55 (L) 05/25/2017 2240   GFRAA 66 07/03/2016 0835   Lab Results  Component Value Date   CHOL 155 07/03/2016   HDL 58 07/03/2016   LDLCALC 82 07/03/2016   TRIG 73 07/03/2016   CHOLHDL 2.7 07/03/2016   Lab Results  Component Value Date   HGBA1C 5.8 (H) 11/22/2021   No results found for: VITAMINB12 Lab Results  Component Value Date   TSH 3.888 09/30/2013      ASSESSMENT AND PLAN 80 y.o. year old adult  has a past medical history of Allergy, Cancer ([place]), Cataract, Chronic kidney  disease, GERD (gastroesophageal reflux disease), Hyperlipidemia, Hypertension, Pre-diabetes, Sleep apnea, and Stroke ([place]). here with:  OSA on CPAP  - CPAP compliance excellent - Good treatment of AHI  - Encourage patient to use CPAP nightly and > 4 hours each night - F/U in 1 year or sooner if needed    Ward Givens, MSN, NP-C 12/07/2021, 1:43 PM [facility] [address], [place], [place] [address] [phone]

## 2021-12-08 ENCOUNTER — Inpatient Hospital Stay: Payer: Medicare Other | Attending: Hematology and Oncology | Admitting: Hematology and Oncology

## 2021-12-08 ENCOUNTER — Other Ambulatory Visit: Payer: Self-pay

## 2021-12-08 ENCOUNTER — Encounter: Payer: Self-pay | Admitting: Hematology and Oncology

## 2021-12-08 DIAGNOSIS — C50412 Malignant neoplasm of upper-outer quadrant of left female breast: Secondary | ICD-10-CM | POA: Diagnosis not present

## 2021-12-08 DIAGNOSIS — C50211 Malignant neoplasm of upper-inner quadrant of right female breast: Secondary | ICD-10-CM | POA: Insufficient documentation

## 2021-12-08 DIAGNOSIS — Z170 Estrogen receptor positive status [ER+]: Secondary | ICD-10-CM | POA: Insufficient documentation

## 2021-12-08 NOTE — Assessment & Plan Note (Addendum)
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  Given strong ER/PR positive tumor, low proliferation index, I do not believe she will benefit from Oncotype testing.  We have discussed about antiestrogen therapy, anastrozole or other aromatase inhibitors for 5 years at least as adjuvant treatment.  We have discussed about mechanism of action, adverse effects of aromatase inhibitors including postmenopausal symptoms, arthralgias, myalgias, bone loss.  She will start this after completion of radiation.

## 2021-12-08 NOTE — Progress Notes (Signed)
[place] NOTE  Patient Care Team: Antony Contras, MD as PCP - General (Family Medicine) Calvert Cantor, MD as Consulting Physician (Ophthalmology) Mauro Kaufmann, RN as Oncology Nurse Navigator Rockwell Germany, RN as Oncology Nurse Navigator Stark Klein, MD as Consulting Physician (General Surgery) Benay Pike, MD as Consulting Physician (Hematology and Oncology) Kyung Rudd, MD as Consulting Physician (Radiation Oncology)  CHIEF COMPLAINTS/PURPOSE OF CONSULTATION:  Newly diagnosed breast cancer, follow up after lumpectomy  HISTORY OF PRESENTING ILLNESS:  Tracey Richard 81 y.o. adult is here because of recent diagnosis of bilateral breast cancer  Chronology SUMMARY OF ONCOLOGIC HISTORY: Oncology History Overview Note   10/18/2021, screening mammogram  Developing asymmetry in right breast lower inner quadrant anterior depth which is indeterminate. Additional views with [place] recommended. Asymmetry in left breast posterior depth central to nipple is indeterminate.  Diagnostic mammogram 2 * 1.3 cms irregular high density mass in right breast lower inner aspect anterior depth is indeterminate. [place] recommended.  Focal asymmetry in the left breast upper outer aspect posterior depth is indeterminate, ultrasound recommended.  Focal asymmetry in the left breast upper inner aspect middle depth is indeterminate, ultrasound recommended.  Ultrasound breast bilateral on 10/28/2021 showed 1.8 cm x 9 mm x 1.7 cm lobulated mass in the right breast upper inner aspect anterior depth highly suggestive of malignancy.  I believe the 9 cm reported in the radiology report could be dictation errors.  Ultrasound-guided biopsy was recommended. 0.5 x 0.5 x 0.4 cm lobulated mass in the left breast upper outer aspect posterior depth is suspicious of malignancy.  Ultrasound-guided biopsy is recommended 0.6 cm x 0.4 cm x 0.5 cm mass in the left breast upper inner aspect middle depth  suspicious of malignancy, ultrasound-guided biopsy is recommended.  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  We did not have any prognostics on the right breast mass.  We will need to pathology at breast etc. number this is what they usually answer so I will call him back possible thinking  I reviewed her records extensively and collaborated the history with the patient.    Malignant neoplasm of upper-inner quadrant of right female breast ([place])  11/15/2021 Initial Diagnosis   Malignant neoplasm of upper-inner quadrant of right female breast ([place])   11/16/2021 Cancer Staging   Staging form: Breast, AJCC 8th Edition - Clinical stage from 11/16/2021: cT1c, cN0, cM0, G2 - Signed by Benay Pike, MD on 11/16/2021 Stage prefix: Initial diagnosis Method of lymph node assessment: Clinical Histologic grading system: 3 grade system    12/01/2021 Genetic Testing   Negative hereditary cancer genetic testing: no pathogenic variants detected in [facility] CustomNext-Cancer +RNAinsight Panel.  The report date is 11/30/2021.   The CustomNext-Cancer+RNAinsight panel offered by [facility] includes sequencing and rearrangement analysis for the following 47 genes:  APC, ATM, AXIN2, BARD1, BMPR1A, BRCA1, BRCA2, BRIP1, CDH1, CDK4, CDKN2A, CHEK2, DICER1, EPCAM, GREM1, HOXB13, MEN1, MLH1, MSH2, MSH3, MSH6, MUTYH, NBN, NF1, NF2, NTHL1, PALB2, PMS2, POLD1, POLE, PTEN, RAD51C, RAD51D, RECQL, RET, SDHA, SDHAF2, SDHB, SDHC, SDHD, SMAD4, SMARCA4, STK11, TP53, TSC1, TSC2, and VHL.  RNA data is routinely analyzed for use in variant interpretation for all genes.   Malignant neoplasm of upper-outer quadrant of left female breast ([place])  11/15/2021 Initial Diagnosis   Malignant neoplasm of upper-outer quadrant of left female breast ([place])  11/16/2021 Cancer Staging   Staging form: Breast, AJCC 8th Edition -  Clinical stage from 11/16/2021: cT1a, cN0, cM0, G2, ER+, PR+, HER2: Unknown - Signed by Benay Pike, MD on 11/16/2021 Stage prefix: Initial diagnosis Method of lymph node assessment: Clinical Histologic grading system: 3 grade system    11/24/2021 Surgery   Right breast lumpectomy showed invasive lobular carcinoma measuring 2.7 cm, mammary carcinoma in situ, all surgical margins negative for carcinoma Invasive carcinoma is 0.3 cm from superior margin prognostics on this tumor showed ER 100% positive, strong staining, PR 100% positive, strong staining, HER2 equivocal with IHC and negative with FISH ratio 1.31, Ki-67 of 5% from the original biopsy. 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   12/01/2021 Genetic Testing   Negative hereditary cancer genetic testing: no pathogenic variants detected in [facility] CustomNext-Cancer +RNAinsight Panel.  The report date is 11/30/2021.   The CustomNext-Cancer+RNAinsight panel offered by [facility] includes sequencing and rearrangement analysis for the following 47 genes:  APC, ATM, AXIN2, BARD1, BMPR1A, BRCA1, BRCA2, BRIP1, CDH1, CDK4, CDKN2A, CHEK2, DICER1, EPCAM, GREM1, HOXB13, MEN1, MLH1, MSH2, MSH3, MSH6, MUTYH, NBN, NF1, NF2, NTHL1, PALB2, PMS2, POLD1, POLE, PTEN, RAD51C, RAD51D, RECQL, RET, SDHA, SDHAF2, SDHB, SDHC, SDHD, SMAD4, SMARCA4, STK11, TP53, TSC1, TSC2, and VHL.  RNA data is routinely analyzed for use in variant interpretation for all genes.    She has recovered quite well from surgery.  No pain.  She is very active, went to a wedding the day after surgery.  Rest of the pertinent 10 point ROS reviewed and  negative.  MEDICAL HISTORY:  Past Medical History:  Diagnosis Date   Allergy    Cancer ([place])    Cataract    Chronic kidney disease    GERD (gastroesophageal reflux disease)    Hyperlipidemia    Hypertension    Pre-diabetes    Sleep apnea    Stroke ([place])    ruptured aneurysm    SURGICAL HISTORY: Past Surgical History:  Procedure Laterality Date   [place]  2007   BREAST LUMPECTOMY WITH RADIOACTIVE SEED LOCALIZATION Bilateral 11/24/2021   Procedure: BILATERAL BREAST LUMPECTOMY WITH RADIOACTIVE SEED LOCALIZATION;  Surgeon: Stark Klein, MD;  Location: [facility];  Service: General;  Laterality: Bilateral;   BREAST SURGERY     DILATION AND CURETTAGE OF UTERUS     EYE SURGERY  1999   bilateral cataract extraction   moles  [place] HISTORY: Social History   Socioeconomic History   Marital status: Single    Spouse name: Not on file   Number of children: Not on file   Years of education: Not on file   Highest education level: Not on file  Occupational History   Occupation: retired  Tobacco Use   Smoking status: Never   Smokeless tobacco: Never  Vaping Use   Vaping Use: Never used  Substance and Sexual Activity   Alcohol use: Yes    Alcohol/week: 1.0 standard drink    Types: 1 Glasses of wine per week    Comment: 1 every 6 mts  Drug use: No   Sexual activity: Never  Other Topics Concern   Not on file  Social History Narrative   Walks daily. Divorced. Education: [employer].      Drinks 4 cups of caffeine daily.   Social Determinants of Health   Financial Resource Strain: Low Risk    Difficulty of Paying Living Expenses: Not hard at all  Food Insecurity: No Food Insecurity   Worried About Charity fundraiser in the Last Year: Never true   [place] in the Last Year: Never true  Transportation Needs: No Transportation Needs   Lack of Transportation (Medical): No   Lack of  Transportation (Non-Medical): No  Physical Activity: Not on file  Stress: Not on file  Social Connections: Not on file  Intimate Partner Violence: Not on file    FAMILY HISTORY: Family History  Problem Relation Age of Onset   Vaginal cancer Mother        d. 69   Hypertension Mother    Prostate cancer Father        d. 69   Hypertension Father    Dementia Father    Diabetes Brother    Lung cancer Paternal Grandmother        dx after 53   Breast cancer Other        PGF's sister; dx after 64   Sleep apnea Neg Hx     ALLERGIES:  is allergic to elemental sulfur, penicillins, oxycodone hcl, and tape.  MEDICATIONS:  Current Outpatient Medications  Medication Sig Dispense Refill   acetaminophen (TYLENOL) 500 MG tablet Take 500-1,000 mg by mouth every 6 (six) hours as needed (pain.).     Ascorbic Acid (VITAMIN C) 1000 MG tablet Take 1,000 mg by mouth daily at 4 PM.     aspirin EC 81 MG tablet Take 81 mg by mouth daily.     Calcium Carb-Cholecalciferol (CALCIUM + D3) 600-800 MG-UNIT TABS Take 1 tablet by mouth daily after breakfast.     esomeprazole (NEXIUM) 20 MG capsule Take 20 mg by mouth daily as needed (acid reflux/indigestion).     HYDROcodone-acetaminophen (NORCO/VICODIN) 5-325 MG tablet Take 1 tablet by mouth every 6 (six) hours as needed for moderate pain. 5 tablet 0   hydroxypropyl methylcellulose / hypromellose (ISOPTO TEARS / GONIOVISC) 2.5 % ophthalmic solution Place 1-2 drops into both eyes 3 (three) times daily as needed for dry eyes.     imipramine (TOFRANIL) 25 MG tablet TAKE 3 TABLETS BY MOUTH AT BEDTIME (Patient taking differently: Take 25 mg by mouth at bedtime.) 270 tablet 3   losartan (COZAAR) 50 MG tablet Take 50 mg by mouth at bedtime.     Multiple Vitamins-Minerals (ZINC PO) Take 50 mg by mouth daily at 4 PM.     polyethylene glycol (MIRALAX / GLYCOLAX) 17 g packet Take 17 g by mouth daily at 4 PM.     simvastatin (ZOCOR) 40 MG tablet TAKE 1 TABLET (40 MG TOTAL)  BY MOUTH DAILY AT BEDTIME 90 tablet 3   Wheat Dextrin (BENEFIBER DRINK MIX PO) Take 1 Dose by mouth daily at 4 PM. 2 teaspoons     No current facility-administered medications for this visit.    PHYSICAL EXAMINATION: ECOG PERFORMANCE STATUS: 0 - Asymptomatic  Vitals:   12/08/21 1435  BP: 136/75  Pulse: 94  Resp: 16  Temp: 98.4 F (36.9 C)  SpO2: 96%   Filed Weights   12/08/21 1435  Weight: 170 lb 12.8  oz (77.5 kg)    GENERAL:alert, no distress and comfortable SKIN: skin color, texture, turgor are normal, no rashes or significant lesions EYES: normal, conjunctiva are pink and non-injected, sclera clear OROPHARYNX:no exudate, no erythema and lips, buccal mucosa, and tongue normal  NECK: supple, thyroid normal size, non-tender, without nodularity LYMPH:  no palpable lymphadenopathy in the cervical, axillary or inguinal LUNGS: clear to auscultation and percussion with normal breathing effort HEART: regular rate & rhythm and no murmurs and no lower extremity edema ABDOMEN:abdomen soft, non-tender and normal bowel sounds Musculoskeletal:no cyanosis of digits and no clubbing  PSYCH: alert & oriented x 3 with fluent speech NEURO: no focal motor/sensory deficits BREAST: Bilateral breasts inspected and palpated.  Postop changes, bruising and seroma.  No other concerns.   LABORATORY DATA:  I have reviewed the data as listed Lab Results  Component Value Date   WBC 8.0 11/16/2021   HGB 11.7 (L) 11/16/2021   HCT 35.6 (L) 11/16/2021   MCV 88.6 11/16/2021   PLT 248 11/16/2021   Lab Results  Component Value Date   NA 140 11/16/2021   K 3.7 11/16/2021   CL 106 11/16/2021   CO2 26 11/16/2021    RADIOGRAPHIC STUDIES: I have personally reviewed the radiological reports and agreed with the findings in the report.  ASSESSMENT AND PLAN:  Malignant neoplasm of upper-inner quadrant of right female breast [facility]) This is a very pleasant 81 year old postmenopausal female patient with  past medical history significant for hypertension, dyslipidemia referred to breast [place] given new diagnosis of right breast invasive lobular cancer.  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  Prognostic show ER 100% positive strong staining, PR 100% positive strong staining, HER2 negative by FISH, Ki-67 of 5% from original biopsy. We have discussed that given strong ER/PR positive tumor and low proliferation index, she might not benefit from Oncotype testing or chemotherapy.  We have discussed about adjuvant antiestrogen therapy, prefer aromatase inhibitors for 5 years. She will meet radiation oncologist early March and will start adjuvant antiestrogen therapy after completion of radiation unless she decides to omit radiation.  Malignant neoplasm of upper-outer quadrant of left female breast ([place]) Left breast lumpectomy showed invasive ductal carcinoma with lobular features measuring 1.3 cm in greatest dimension,grade 1, all surgical margins negative, prognostics from initial biopsy show ER 100% strong staining, PR 100% positive strong staining and HER2 equivocal with IHC and negative by FISH, Ki-67 of 1%.  Given strong ER/PR positive tumor, low proliferation index, I do not believe she will benefit from Oncotype testing.  We have discussed about antiestrogen therapy, anastrozole or other aromatase inhibitors for 5 years at least as adjuvant treatment.  We have discussed about mechanism of action, adverse effects of aromatase inhibitors including postmenopausal symptoms, arthralgias, myalgias, bone loss.  She will start this after completion of radiation.  She is agreeable to all these recommendations.  She will return to clinic at the end of March.  All questions were answered. The patient knows to call the clinic with any problems, questions or concerns.    Benay Pike,  MD 12/08/21

## 2021-12-08 NOTE — Assessment & Plan Note (Addendum)
This is a very pleasant 81 year old postmenopausal female patient with past medical history significant for hypertension, dyslipidemia referred to breast [place] given new diagnosis of right breast invasive lobular cancer.  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  Prognostic show ER 100% positive strong staining, PR 100% positive strong staining, HER2 negative by FISH, Ki-67 of 5% from original biopsy. We have discussed that given strong ER/PR positive tumor and low proliferation index, she might not benefit from Oncotype testing or chemotherapy.  We have discussed about adjuvant antiestrogen therapy, prefer aromatase inhibitors for 5 years. She will meet radiation oncologist early March and will start adjuvant antiestrogen therapy after completion of radiation unless she decides to omit radiation.

## 2021-12-09 ENCOUNTER — Telehealth: Payer: Self-pay | Admitting: Hematology and Oncology

## 2021-12-09 NOTE — Telephone Encounter (Signed)
Scheduled appointment per 02/16 los. Patient aware.

## 2021-12-19 ENCOUNTER — Telehealth: Payer: Self-pay

## 2021-12-19 NOTE — Telephone Encounter (Signed)
Spoke w/ patient, verified identity and reminded patient of her 1:00pm-12/22/21 in-person appointment w/ Shona Simpson PA-C. I advised patient arrive 71min early for check-in. I left my extension [phone] in case patient needs anything. Patient verbalized understanding of information.

## 2021-12-22 ENCOUNTER — Ambulatory Visit: Payer: Medicare Other | Admitting: Radiation Oncology

## 2021-12-22 ENCOUNTER — Other Ambulatory Visit: Payer: Self-pay

## 2021-12-22 ENCOUNTER — Ambulatory Visit
Admission: RE | Admit: 2021-12-22 | Discharge: 2021-12-22 | Disposition: A | Discharge status: Home / Self Care | Payer: Medicare Other | Source: Ambulatory Visit | Attending: Radiation Oncology | Admitting: Radiation Oncology

## 2021-12-22 ENCOUNTER — Encounter: Payer: Self-pay | Admitting: Radiation Oncology

## 2021-12-22 VITALS — BP 128/69 | HR 71 | Temp 97.9°F | Resp 20 | Ht 63.0 in | Wt 172.6 lb

## 2021-12-22 DIAGNOSIS — C50211 Malignant neoplasm of upper-inner quadrant of right female breast: Secondary | ICD-10-CM | POA: Insufficient documentation

## 2021-12-22 DIAGNOSIS — Z79811 Long term (current) use of aromatase inhibitors: Secondary | ICD-10-CM | POA: Insufficient documentation

## 2021-12-22 DIAGNOSIS — Z7982 Long term (current) use of aspirin: Secondary | ICD-10-CM | POA: Diagnosis not present

## 2021-12-22 DIAGNOSIS — Z8673 Personal history of transient ischemic attack (TIA), and cerebral infarction without residual deficits: Secondary | ICD-10-CM | POA: Insufficient documentation

## 2021-12-22 DIAGNOSIS — K219 Gastro-esophageal reflux disease without esophagitis: Secondary | ICD-10-CM | POA: Insufficient documentation

## 2021-12-22 DIAGNOSIS — G4730 Sleep apnea, unspecified: Secondary | ICD-10-CM | POA: Diagnosis not present

## 2021-12-22 DIAGNOSIS — C50412 Malignant neoplasm of upper-outer quadrant of left female breast: Secondary | ICD-10-CM | POA: Diagnosis not present

## 2021-12-22 DIAGNOSIS — Z79899 Other long term (current) drug therapy: Secondary | ICD-10-CM | POA: Diagnosis not present

## 2021-12-22 DIAGNOSIS — Z170 Estrogen receptor positive status [ER+]: Secondary | ICD-10-CM | POA: Diagnosis not present

## 2021-12-22 DIAGNOSIS — E785 Hyperlipidemia, unspecified: Secondary | ICD-10-CM | POA: Diagnosis not present

## 2021-12-22 DIAGNOSIS — I129 Hypertensive chronic kidney disease with stage 1 through stage 4 chronic kidney disease, or unspecified chronic kidney disease: Secondary | ICD-10-CM | POA: Insufficient documentation

## 2021-12-22 DIAGNOSIS — N189 Chronic kidney disease, unspecified: Secondary | ICD-10-CM | POA: Diagnosis not present

## 2021-12-22 NOTE — Progress Notes (Addendum)
Spoke w/ patient, verified identity, and began nursing interview w/ significant other Mr. Elesa Hacker in attendance. Patient reports LT breast discomfort 4/10, otherwise doing well. No other issues reported at this time. ? ?Meaningful use complete. ? ?BP 128/69 (BP Location: Left Arm, Patient Position: Sitting, Cuff Size: Normal)   Pulse 71   Temp 97.9 ?F (36.6 ?C) (Temporal)   Resp 20   Ht 5\' 3"  (1.6 m)   Wt 172 lb 9.6 oz (78.3 kg)   SpO2 98%   BMI 30.57 kg/m?  ? ? ? ?

## 2021-12-23 ENCOUNTER — Ambulatory Visit
Admission: RE | Admit: 2021-12-23 | Discharge: 2021-12-23 | Disposition: A | Discharge status: Home / Self Care | Payer: Medicare Other | Source: Ambulatory Visit | Attending: Radiation Oncology | Admitting: Radiation Oncology

## 2021-12-23 DIAGNOSIS — Z170 Estrogen receptor positive status [ER+]: Secondary | ICD-10-CM | POA: Insufficient documentation

## 2021-12-23 DIAGNOSIS — Z510 Encounter for antineoplastic radiation therapy: Secondary | ICD-10-CM | POA: Diagnosis not present

## 2021-12-23 DIAGNOSIS — C50412 Malignant neoplasm of upper-outer quadrant of left female breast: Secondary | ICD-10-CM | POA: Insufficient documentation

## 2021-12-23 DIAGNOSIS — C50211 Malignant neoplasm of upper-inner quadrant of right female breast: Secondary | ICD-10-CM | POA: Diagnosis not present

## 2021-12-23 NOTE — Progress Notes (Signed)
Radiation Oncology         (336) [phone] ________________________________  Name: Tracey Richard        MRN: [id number]  Date of Service: 12/22/2021 DOB: 05/28/1941  WU:[id number], Shanon Brow, MD  Benay Pike, MD     REFERRING PHYSICIAN: Benay Pike, MD   DIAGNOSIS: The encounter diagnosis was Malignant neoplasm of upper-inner quadrant of right breast in female, estrogen receptor positive ([place]).   HISTORY OF PRESENT ILLNESS: Tracey Richard is a 81 y.o. female originally seen in the multidisciplinary breast clinic for a new diagnosis of bilateral breast cancer. The patient was noted to have assymmetry in both breasts on screening mammogram. She underwent diagnostic imaging that showed a 1.8 cm mass in the 2:00 position of the right breast and her axilla was negative for adenopathy. On the left a benign lesion at 12:00 was sampled, and in addiation at 2:00 there was a 5 mm lesion and her left axilla was negative for adenopathy. 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 The left 2:00 lesion was a grade 1-2 invasive lobular carcinoma that was ER/PR positive, HER2 negative, and Ki 67 was 1%.   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 Margins were negative. The right breast revealed a 2.7 cm, grade 2 invasive lobular carcinoma with negative margins. She is seen today to discuss treatment recommendations to include radiation.    PREVIOUS RADIATION THERAPY: No   PAST MEDICAL HISTORY:  Past Medical History:  Diagnosis Date   Allergy    Cancer ([place])    Cataract    Chronic kidney disease    GERD (gastroesophageal reflux disease)    Hyperlipidemia    Hypertension    Pre-diabetes    Sleep apnea    Stroke ([place])    ruptured aneurysm       PAST SURGICAL HISTORY: Past Surgical History:   Procedure Laterality Date   [place]  2007   BREAST LUMPECTOMY WITH RADIOACTIVE SEED LOCALIZATION Bilateral 11/24/2021   Procedure: BILATERAL BREAST LUMPECTOMY WITH RADIOACTIVE SEED LOCALIZATION;  Surgeon: Stark Klein, MD;  Location: [facility];  Service: General;  Laterality: Bilateral;   BREAST SURGERY     DILATION AND CURETTAGE OF UTERUS     EYE SURGERY  1999   bilateral cataract extraction   moles  [place]     FAMILY HISTORY:  Family History  Problem Relation Age of Onset   Vaginal cancer Mother        d. 87   Hypertension Mother    Prostate cancer Father        d. 2   Hypertension Father    Dementia Father    Diabetes Brother    Lung cancer Paternal Grandmother        dx after 17   Breast cancer Other        PGF's sister; dx after 67   Sleep apnea Neg Hx      SOCIAL HISTORY:  reports that she has never smoked. She has never used smokeless tobacco. She reports current alcohol use of about 1.0 standard drink per week. She reports that she does not use drugs. The patient lives in [place]. She is in a relationship with her partner Arnell Sieving  Droste. She is accompanied by him. She enjoys seeing wildlife on her walks that she takes each afternoon, and is very social in her neighborhood and in the [place] garden.       ALLERGIES: Elemental sulfur, Penicillins, Oxycodone hcl, and Tape   MEDICATIONS:  Current Outpatient Medications  Medication Sig Dispense Refill   acetaminophen (TYLENOL) 500 MG tablet Take 500-1,000 mg by mouth every 6 (six) hours as needed (pain.).     Ascorbic Acid (VITAMIN C) 1000 MG tablet Take 1,000 mg by mouth daily at 4 PM.     aspirin EC 81 MG tablet Take 81 mg by mouth daily.     Calcium Carb-Cholecalciferol (CALCIUM + D3) 600-800 MG-UNIT TABS Take 1 tablet by mouth daily after breakfast.     hydroxypropyl methylcellulose / hypromellose (ISOPTO TEARS / GONIOVISC) 2.5 % ophthalmic solution  Place 1-2 drops into both eyes 3 (three) times daily as needed for dry eyes.     imipramine (TOFRANIL) 25 MG tablet TAKE 3 TABLETS BY MOUTH AT BEDTIME (Patient taking differently: Take 25 mg by mouth at bedtime. 1 tab QHS) 270 tablet 3   losartan (COZAAR) 50 MG tablet Take 50 mg by mouth at bedtime.     Multiple Vitamins-Minerals (ZINC PO) Take 50 mg by mouth daily at 4 PM.     polyethylene glycol (MIRALAX / GLYCOLAX) 17 g packet Take 17 g by mouth daily at 4 PM.     simvastatin (ZOCOR) 40 MG tablet TAKE 1 TABLET (40 MG TOTAL) BY MOUTH DAILY AT BEDTIME 90 tablet 3   Wheat Dextrin (BENEFIBER DRINK MIX PO) Take 1 Dose by mouth daily at 4 PM. 2 teaspoons     esomeprazole (NEXIUM) 20 MG capsule Take 20 mg by mouth daily as needed (acid reflux/indigestion). (Patient not taking: Reported on 12/22/2021)     HYDROcodone-acetaminophen (NORCO/VICODIN) 5-325 MG tablet Take 1 tablet by mouth every 6 (six) hours as needed for moderate pain. (Patient not taking: Reported on 12/22/2021) 5 tablet 0   No current facility-administered medications for this encounter.     REVIEW OF SYSTEMS: On review of systems, the patient reports that she is doing well overall. She has had some fullness and discomfort in the left breast and has been wearing a compression bra which is difficult to put on and take off. No other complaints are verbalized.   PHYSICAL EXAM:  Wt Readings from Last 3 Encounters:  12/22/21 172 lb 9.6 oz (78.3 kg)  12/08/21 170 lb 12.8 oz (77.5 kg)  12/07/21 167 lb 6.4 oz (75.9 kg)   Temp Readings from Last 3 Encounters:  12/22/21 97.9 F (36.6 C) (Temporal)  12/08/21 98.4 F (36.9 C) (Temporal)  11/24/21 (!) 97.5 F (36.4 C)   BP Readings from Last 3 Encounters:  12/22/21 128/69  12/08/21 136/75  12/07/21 117/61   Pulse Readings from Last 3 Encounters:  12/22/21 71  12/08/21 94  12/07/21 71    In general this is a well appearing caucasian female in no acute distress. She's alert and  oriented x4 and appropriate throughout the examination. Cardiopulmonary assessment is negative for acute distress and she exhibits normal effort. Bilateral breast exam reveals well healed incision sites without erythema, separation, or drainage. No fluctuance is noted but mild induration is palpable deep to the incision sites.     ECOG = 1  0 - Asymptomatic (Fully active, able to carry on all predisease activities without restriction)  1 - Symptomatic but completely ambulatory (Restricted  in physically strenuous activity but ambulatory and able to carry out work of a light or sedentary nature. For example, light housework, office work)  2 - Symptomatic, <50% in bed during the day (Ambulatory and capable of all self care but unable to carry out any work activities. Up and about more than 50% of waking hours)  3 - Symptomatic, >50% in bed, but not bedbound (Capable of only limited self-care, confined to bed or chair 50% or more of waking hours)  4 - Bedbound (Completely disabled. Cannot carry on any self-care. Totally confined to bed or chair)  5 - Death   Eustace Pen MM, Creech RH, Tormey DC, et al. [phone]). "Toxicity and response criteria of the [facility]". [place] Oncol. 5 (6): 649-55    LABORATORY DATA:  Lab Results  Component Value Date   WBC 8.0 11/16/2021   HGB 11.7 (L) 11/16/2021   HCT 35.6 (L) 11/16/2021   MCV 88.6 11/16/2021   PLT 248 11/16/2021   Lab Results  Component Value Date   NA 140 11/16/2021   K 3.7 11/16/2021   CL 106 11/16/2021   CO2 26 11/16/2021   Lab Results  Component Value Date   ALT 17 11/16/2021   AST 18 11/16/2021   ALKPHOS 51 11/16/2021   BILITOT 0.6 11/16/2021      RADIOGRAPHY: No results found.     IMPRESSION/PLAN: 1.  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  Dr. Lisbeth Renshaw discusses the final pathology findings and reviews the nature of early stage bilatearl breast disease. She has done well since surgery and is healing well. She is encouraged to continue to wear her compression bra a bit longer due to soreness from her left sided induration. Dr. Lisbeth Renshaw reviews that he would recommend external radiotherapy to the right breast given the T2 measurements despite the other favorable features. He does discuss that radiotherapy to the left breast could be avoided, but after discussion, the patient is in agreement to treat both breasts since the right should be treated. We discussed the risks, benefits, short, and long term effects of radiotherapy, as well as the curative intent, and the patient may be interested in proceeding. Dr. Lisbeth Renshaw discusses the delivery and logistics of radiotherapy and recommends 4 weeks of radiotherapy to bilateral breasts. Written consent is obtained and placed in the chart, a copy was provided to the patient. She will simulate tomorrow.   In a visit lasting 45 minutes, greater than 50% of the time was spent face to face reviewing her case, as well as in preparation of, discussing, and coordinating the patient's care.  The above documentation reflects my direct findings during this shared patient visit. Please see the separate note by Dr. Lisbeth Renshaw on this date for the remainder of the patient's plan of care.    Carola Rhine, [facility]    **Disclaimer: This note was dictated with voice recognition software. Similar sounding words can inadvertently be transcribed and this note may contain transcription errors which may not have been corrected upon publication of note.**

## 2021-12-29 ENCOUNTER — Encounter: Payer: Self-pay | Admitting: *Deleted

## 2022-01-01 DIAGNOSIS — Z170 Estrogen receptor positive status [ER+]: Secondary | ICD-10-CM | POA: Diagnosis not present

## 2022-01-01 DIAGNOSIS — Z510 Encounter for antineoplastic radiation therapy: Secondary | ICD-10-CM | POA: Diagnosis not present

## 2022-01-01 DIAGNOSIS — C50412 Malignant neoplasm of upper-outer quadrant of left female breast: Secondary | ICD-10-CM | POA: Diagnosis not present

## 2022-01-01 DIAGNOSIS — C50211 Malignant neoplasm of upper-inner quadrant of right female breast: Secondary | ICD-10-CM | POA: Diagnosis not present

## 2022-01-02 ENCOUNTER — Other Ambulatory Visit: Payer: Self-pay

## 2022-01-02 ENCOUNTER — Ambulatory Visit
Admission: RE | Admit: 2022-01-02 | Discharge: 2022-01-02 | Disposition: A | Discharge status: Home / Self Care | Payer: Medicare Other | Source: Ambulatory Visit | Attending: Radiation Oncology | Admitting: Radiation Oncology

## 2022-01-02 DIAGNOSIS — Z510 Encounter for antineoplastic radiation therapy: Secondary | ICD-10-CM | POA: Diagnosis not present

## 2022-01-02 DIAGNOSIS — C50412 Malignant neoplasm of upper-outer quadrant of left female breast: Secondary | ICD-10-CM | POA: Diagnosis not present

## 2022-01-02 DIAGNOSIS — Z170 Estrogen receptor positive status [ER+]: Secondary | ICD-10-CM | POA: Diagnosis not present

## 2022-01-02 DIAGNOSIS — C50211 Malignant neoplasm of upper-inner quadrant of right female breast: Secondary | ICD-10-CM | POA: Diagnosis not present

## 2022-01-03 ENCOUNTER — Ambulatory Visit
Admission: RE | Admit: 2022-01-03 | Discharge: 2022-01-03 | Disposition: A | Discharge status: Home / Self Care | Payer: Medicare Other | Source: Ambulatory Visit | Attending: Radiation Oncology | Admitting: Radiation Oncology

## 2022-01-03 DIAGNOSIS — Z510 Encounter for antineoplastic radiation therapy: Secondary | ICD-10-CM | POA: Diagnosis not present

## 2022-01-03 DIAGNOSIS — Z170 Estrogen receptor positive status [ER+]: Secondary | ICD-10-CM | POA: Diagnosis not present

## 2022-01-03 DIAGNOSIS — C50412 Malignant neoplasm of upper-outer quadrant of left female breast: Secondary | ICD-10-CM | POA: Diagnosis not present

## 2022-01-03 DIAGNOSIS — C50211 Malignant neoplasm of upper-inner quadrant of right female breast: Secondary | ICD-10-CM | POA: Diagnosis not present

## 2022-01-04 ENCOUNTER — Ambulatory Visit
Admission: RE | Admit: 2022-01-04 | Discharge: 2022-01-04 | Disposition: A | Discharge status: Home / Self Care | Payer: Medicare Other | Source: Ambulatory Visit | Attending: Radiation Oncology | Admitting: Radiation Oncology

## 2022-01-04 ENCOUNTER — Other Ambulatory Visit: Payer: Self-pay

## 2022-01-04 DIAGNOSIS — Z510 Encounter for antineoplastic radiation therapy: Secondary | ICD-10-CM | POA: Diagnosis not present

## 2022-01-04 DIAGNOSIS — Z170 Estrogen receptor positive status [ER+]: Secondary | ICD-10-CM | POA: Diagnosis not present

## 2022-01-04 DIAGNOSIS — C50412 Malignant neoplasm of upper-outer quadrant of left female breast: Secondary | ICD-10-CM | POA: Diagnosis not present

## 2022-01-04 DIAGNOSIS — C50211 Malignant neoplasm of upper-inner quadrant of right female breast: Secondary | ICD-10-CM | POA: Diagnosis not present

## 2022-01-05 ENCOUNTER — Ambulatory Visit
Admission: RE | Admit: 2022-01-05 | Discharge: 2022-01-05 | Disposition: A | Discharge status: Home / Self Care | Payer: Medicare Other | Source: Ambulatory Visit | Attending: Radiation Oncology | Admitting: Radiation Oncology

## 2022-01-05 ENCOUNTER — Encounter (HOSPITAL_COMMUNITY): Payer: Self-pay

## 2022-01-05 ENCOUNTER — Other Ambulatory Visit: Payer: Self-pay

## 2022-01-05 DIAGNOSIS — C50412 Malignant neoplasm of upper-outer quadrant of left female breast: Secondary | ICD-10-CM | POA: Diagnosis not present

## 2022-01-05 DIAGNOSIS — C50211 Malignant neoplasm of upper-inner quadrant of right female breast: Secondary | ICD-10-CM | POA: Diagnosis not present

## 2022-01-05 DIAGNOSIS — Z510 Encounter for antineoplastic radiation therapy: Secondary | ICD-10-CM | POA: Diagnosis not present

## 2022-01-05 DIAGNOSIS — Z170 Estrogen receptor positive status [ER+]: Secondary | ICD-10-CM | POA: Diagnosis not present

## 2022-01-06 ENCOUNTER — Ambulatory Visit
Admission: RE | Admit: 2022-01-06 | Discharge: 2022-01-06 | Disposition: A | Discharge status: Home / Self Care | Payer: Medicare Other | Source: Ambulatory Visit | Attending: Radiation Oncology | Admitting: Radiation Oncology

## 2022-01-06 DIAGNOSIS — C50412 Malignant neoplasm of upper-outer quadrant of left female breast: Secondary | ICD-10-CM

## 2022-01-06 DIAGNOSIS — Z510 Encounter for antineoplastic radiation therapy: Secondary | ICD-10-CM | POA: Diagnosis not present

## 2022-01-06 DIAGNOSIS — Z170 Estrogen receptor positive status [ER+]: Secondary | ICD-10-CM | POA: Diagnosis not present

## 2022-01-06 DIAGNOSIS — C50211 Malignant neoplasm of upper-inner quadrant of right female breast: Secondary | ICD-10-CM | POA: Diagnosis not present

## 2022-01-06 MED ORDER — RADIAPLEXRX EX GEL: Freq: Once | CUTANEOUS | Status: AC

## 2022-01-06 NOTE — Progress Notes (Signed)
Pt here for patient teaching.  Pt given Radiation and You booklet, skin care instructions, and Radiaplex.  Patient was advised to obtain over the counter aluminum free deodorant.  Reviewed areas of pertinence such as fatigue, hair loss, skin changes, breast tenderness, and breast swelling. Pt able to give teach back of to pat skin and use unscented/gentle soap,apply Radiaplex bid, avoid applying anything to skin within 4 hours of treatment, avoid wearing an under wire bra, and to use an electric razor if they must shave. Pt verbalizes understanding of information given and will contact nursing with any questions or concerns.   ? ?Gloriajean Dell. Leonie Green, BSN  ?

## 2022-01-07 ENCOUNTER — Other Ambulatory Visit: Payer: Self-pay | Admitting: Adult Health

## 2022-01-09 ENCOUNTER — Other Ambulatory Visit: Payer: Self-pay

## 2022-01-09 ENCOUNTER — Ambulatory Visit
Admission: RE | Admit: 2022-01-09 | Discharge: 2022-01-09 | Disposition: A | Discharge status: Home / Self Care | Payer: Medicare Other | Source: Ambulatory Visit | Attending: Radiation Oncology | Admitting: Radiation Oncology

## 2022-01-09 DIAGNOSIS — C50412 Malignant neoplasm of upper-outer quadrant of left female breast: Secondary | ICD-10-CM | POA: Diagnosis not present

## 2022-01-09 DIAGNOSIS — Z170 Estrogen receptor positive status [ER+]: Secondary | ICD-10-CM | POA: Diagnosis not present

## 2022-01-09 DIAGNOSIS — Z510 Encounter for antineoplastic radiation therapy: Secondary | ICD-10-CM | POA: Diagnosis not present

## 2022-01-09 DIAGNOSIS — C50211 Malignant neoplasm of upper-inner quadrant of right female breast: Secondary | ICD-10-CM | POA: Diagnosis not present

## 2022-01-10 ENCOUNTER — Ambulatory Visit
Admission: RE | Admit: 2022-01-10 | Discharge: 2022-01-10 | Disposition: A | Discharge status: Home / Self Care | Payer: Medicare Other | Source: Ambulatory Visit | Attending: Radiation Oncology | Admitting: Radiation Oncology

## 2022-01-10 DIAGNOSIS — Z510 Encounter for antineoplastic radiation therapy: Secondary | ICD-10-CM | POA: Diagnosis not present

## 2022-01-10 DIAGNOSIS — C50211 Malignant neoplasm of upper-inner quadrant of right female breast: Secondary | ICD-10-CM | POA: Diagnosis not present

## 2022-01-10 DIAGNOSIS — Z170 Estrogen receptor positive status [ER+]: Secondary | ICD-10-CM | POA: Diagnosis not present

## 2022-01-10 DIAGNOSIS — C50412 Malignant neoplasm of upper-outer quadrant of left female breast: Secondary | ICD-10-CM | POA: Diagnosis not present

## 2022-01-11 ENCOUNTER — Ambulatory Visit
Admission: RE | Admit: 2022-01-11 | Discharge: 2022-01-11 | Disposition: A | Discharge status: Home / Self Care | Payer: Medicare Other | Source: Ambulatory Visit | Attending: Radiation Oncology | Admitting: Radiation Oncology

## 2022-01-11 ENCOUNTER — Other Ambulatory Visit: Payer: Self-pay

## 2022-01-11 DIAGNOSIS — Z170 Estrogen receptor positive status [ER+]: Secondary | ICD-10-CM | POA: Diagnosis not present

## 2022-01-11 DIAGNOSIS — C50211 Malignant neoplasm of upper-inner quadrant of right female breast: Secondary | ICD-10-CM | POA: Diagnosis not present

## 2022-01-11 DIAGNOSIS — C50412 Malignant neoplasm of upper-outer quadrant of left female breast: Secondary | ICD-10-CM | POA: Diagnosis not present

## 2022-01-11 DIAGNOSIS — Z510 Encounter for antineoplastic radiation therapy: Secondary | ICD-10-CM | POA: Diagnosis not present

## 2022-01-12 ENCOUNTER — Other Ambulatory Visit: Payer: Self-pay

## 2022-01-12 ENCOUNTER — Ambulatory Visit
Admission: RE | Admit: 2022-01-12 | Discharge: 2022-01-12 | Disposition: A | Discharge status: Home / Self Care | Payer: Medicare Other | Source: Ambulatory Visit | Attending: Radiation Oncology | Admitting: Radiation Oncology

## 2022-01-12 DIAGNOSIS — Z170 Estrogen receptor positive status [ER+]: Secondary | ICD-10-CM | POA: Diagnosis not present

## 2022-01-12 DIAGNOSIS — C50412 Malignant neoplasm of upper-outer quadrant of left female breast: Secondary | ICD-10-CM | POA: Diagnosis not present

## 2022-01-12 DIAGNOSIS — Z510 Encounter for antineoplastic radiation therapy: Secondary | ICD-10-CM | POA: Diagnosis not present

## 2022-01-12 DIAGNOSIS — C50211 Malignant neoplasm of upper-inner quadrant of right female breast: Secondary | ICD-10-CM | POA: Diagnosis not present

## 2022-01-12 NOTE — Telephone Encounter (Signed)
I called pt as she had not read her email.  She is taking 1-3tabs po qhs depending how she feels re; sleep.   She wanted the 3 tabs sent in , to [facility].  She appreciated call back.  I gave her the mychart help desk # to see if get her email aaccount fixed.   ?

## 2022-01-13 ENCOUNTER — Ambulatory Visit
Admission: RE | Admit: 2022-01-13 | Discharge: 2022-01-13 | Disposition: A | Discharge status: Home / Self Care | Payer: Medicare Other | Source: Ambulatory Visit | Attending: Radiation Oncology | Admitting: Radiation Oncology

## 2022-01-13 ENCOUNTER — Other Ambulatory Visit: Payer: Self-pay

## 2022-01-13 DIAGNOSIS — Z510 Encounter for antineoplastic radiation therapy: Secondary | ICD-10-CM | POA: Diagnosis not present

## 2022-01-13 DIAGNOSIS — C50412 Malignant neoplasm of upper-outer quadrant of left female breast: Secondary | ICD-10-CM | POA: Diagnosis not present

## 2022-01-13 DIAGNOSIS — C50211 Malignant neoplasm of upper-inner quadrant of right female breast: Secondary | ICD-10-CM | POA: Diagnosis not present

## 2022-01-13 DIAGNOSIS — Z170 Estrogen receptor positive status [ER+]: Secondary | ICD-10-CM | POA: Diagnosis not present

## 2022-01-16 ENCOUNTER — Other Ambulatory Visit: Payer: Self-pay

## 2022-01-16 ENCOUNTER — Ambulatory Visit
Admission: RE | Admit: 2022-01-16 | Discharge: 2022-01-16 | Disposition: A | Discharge status: Home / Self Care | Payer: Medicare Other | Source: Ambulatory Visit | Attending: Radiation Oncology | Admitting: Radiation Oncology

## 2022-01-16 DIAGNOSIS — Z510 Encounter for antineoplastic radiation therapy: Secondary | ICD-10-CM | POA: Diagnosis not present

## 2022-01-16 DIAGNOSIS — Z170 Estrogen receptor positive status [ER+]: Secondary | ICD-10-CM | POA: Diagnosis not present

## 2022-01-16 DIAGNOSIS — C50412 Malignant neoplasm of upper-outer quadrant of left female breast: Secondary | ICD-10-CM | POA: Diagnosis not present

## 2022-01-16 DIAGNOSIS — C50211 Malignant neoplasm of upper-inner quadrant of right female breast: Secondary | ICD-10-CM | POA: Diagnosis not present

## 2022-01-17 ENCOUNTER — Other Ambulatory Visit: Payer: Self-pay

## 2022-01-17 ENCOUNTER — Ambulatory Visit
Admission: RE | Admit: 2022-01-17 | Discharge: 2022-01-17 | Disposition: A | Discharge status: Home / Self Care | Payer: Medicare Other | Source: Ambulatory Visit | Attending: Radiation Oncology | Admitting: Radiation Oncology

## 2022-01-17 DIAGNOSIS — C50412 Malignant neoplasm of upper-outer quadrant of left female breast: Secondary | ICD-10-CM | POA: Diagnosis not present

## 2022-01-17 DIAGNOSIS — C50211 Malignant neoplasm of upper-inner quadrant of right female breast: Secondary | ICD-10-CM | POA: Diagnosis not present

## 2022-01-17 DIAGNOSIS — Z510 Encounter for antineoplastic radiation therapy: Secondary | ICD-10-CM | POA: Diagnosis not present

## 2022-01-17 DIAGNOSIS — Z170 Estrogen receptor positive status [ER+]: Secondary | ICD-10-CM | POA: Diagnosis not present

## 2022-01-18 ENCOUNTER — Ambulatory Visit
Admission: RE | Admit: 2022-01-18 | Discharge: 2022-01-18 | Disposition: A | Discharge status: Home / Self Care | Payer: Medicare Other | Source: Ambulatory Visit | Attending: Radiation Oncology | Admitting: Radiation Oncology

## 2022-01-18 DIAGNOSIS — Z510 Encounter for antineoplastic radiation therapy: Secondary | ICD-10-CM | POA: Diagnosis not present

## 2022-01-18 DIAGNOSIS — C50412 Malignant neoplasm of upper-outer quadrant of left female breast: Secondary | ICD-10-CM | POA: Diagnosis not present

## 2022-01-18 DIAGNOSIS — Z170 Estrogen receptor positive status [ER+]: Secondary | ICD-10-CM | POA: Diagnosis not present

## 2022-01-18 DIAGNOSIS — C50211 Malignant neoplasm of upper-inner quadrant of right female breast: Secondary | ICD-10-CM | POA: Diagnosis not present

## 2022-01-19 ENCOUNTER — Other Ambulatory Visit: Payer: Self-pay

## 2022-01-19 ENCOUNTER — Ambulatory Visit
Admission: RE | Admit: 2022-01-19 | Discharge: 2022-01-19 | Disposition: A | Discharge status: Home / Self Care | Payer: Medicare Other | Source: Ambulatory Visit | Attending: Radiation Oncology | Admitting: Radiation Oncology

## 2022-01-19 DIAGNOSIS — C50412 Malignant neoplasm of upper-outer quadrant of left female breast: Secondary | ICD-10-CM | POA: Diagnosis not present

## 2022-01-19 DIAGNOSIS — Z510 Encounter for antineoplastic radiation therapy: Secondary | ICD-10-CM | POA: Diagnosis not present

## 2022-01-19 DIAGNOSIS — C50211 Malignant neoplasm of upper-inner quadrant of right female breast: Secondary | ICD-10-CM | POA: Diagnosis not present

## 2022-01-19 DIAGNOSIS — Z170 Estrogen receptor positive status [ER+]: Secondary | ICD-10-CM | POA: Diagnosis not present

## 2022-01-20 ENCOUNTER — Ambulatory Visit
Admission: RE | Admit: 2022-01-20 | Discharge: 2022-01-20 | Disposition: A | Discharge status: Home / Self Care | Payer: Medicare Other | Source: Ambulatory Visit | Attending: Radiation Oncology | Admitting: Radiation Oncology

## 2022-01-20 ENCOUNTER — Ambulatory Visit: Payer: Medicare Other | Admitting: Radiation Oncology

## 2022-01-20 ENCOUNTER — Inpatient Hospital Stay: Payer: Medicare Other | Admitting: Hematology and Oncology

## 2022-01-20 DIAGNOSIS — Z510 Encounter for antineoplastic radiation therapy: Secondary | ICD-10-CM | POA: Diagnosis not present

## 2022-01-20 DIAGNOSIS — C50211 Malignant neoplasm of upper-inner quadrant of right female breast: Secondary | ICD-10-CM | POA: Insufficient documentation

## 2022-01-20 DIAGNOSIS — C50412 Malignant neoplasm of upper-outer quadrant of left female breast: Secondary | ICD-10-CM | POA: Insufficient documentation

## 2022-01-20 DIAGNOSIS — Z170 Estrogen receptor positive status [ER+]: Secondary | ICD-10-CM | POA: Insufficient documentation

## 2022-01-20 NOTE — Progress Notes (Signed)
[facility] ?CONSULT NOTE ? ?Patient Care Team: ?Antony Contras, MD as PCP - General (Family Medicine) ?Calvert Cantor, MD as Consulting Physician (Ophthalmology) ?Mauro Kaufmann, RN as Oncology Nurse Navigator ?Rockwell Germany, RN as Oncology Nurse Navigator ?Stark Klein, MD as Consulting Physician (General Surgery) ?Benay Pike, MD as Consulting Physician (Hematology and Oncology) ?Kyung Rudd, MD as Consulting Physician (Radiation Oncology) ? ?CHIEF COMPLAINTS/PURPOSE OF CONSULTATION:  ?Newly diagnosed breast cancer, follow up after radiation ? ?HISTORY OF PRESENTING ILLNESS:  ?Tracey Richard 81 y.o. adult is here because of recent diagnosis of bilateral breast cancer ? ?Chronology ?SUMMARY OF ONCOLOGIC HISTORY: ?Oncology History Overview Note  ? ?10/18/2021, screening mammogram ? ?Developing asymmetry in right breast lower inner quadrant anterior depth which is indeterminate. Additional views with [place] recommended. Asymmetry in left breast posterior depth central to nipple is indeterminate. ? ?Diagnostic mammogram 2 * 1.3 cms irregular high density mass in right breast lower inner aspect anterior depth is indeterminate. [place] recommended.  Focal asymmetry in the left breast upper outer aspect posterior depth is indeterminate, ultrasound recommended.  Focal asymmetry in the left breast upper inner aspect middle depth is indeterminate, ultrasound recommended. ? ?Ultrasound breast bilateral on 10/28/2021 showed 1.8 cm x 9 mm x 1.7 cm lobulated mass in the right breast upper inner aspect anterior depth highly suggestive of malignancy.  I believe the 9 cm reported in the radiology report could be dictation errors.  Ultrasound-guided biopsy was recommended. ?0.5 x 0.5 x 0.4 cm lobulated mass in the left breast upper outer aspect posterior depth is suspicious of malignancy.  Ultrasound-guided biopsy is recommended ?0.6 cm x 0.4 cm x 0.5 cm mass in the left breast upper inner aspect middle depth  suspicious of malignancy, ultrasound-guided biopsy is recommended. ? ?Pathology provided at breast [facility] showed left breast mass to be invasive lobular carcinoma grade 1 or 2, ER 100% strong, PR 100% strong, HER2 FISH pending, KI of 1%.  We did not have any prognostics on the right breast mass.  We will need to pathology at breast etc. number this is what they usually answer so I will call him back possible thinking ? ?I reviewed her records extensively and collaborated the history with the patient. ? ?  ?Malignant neoplasm of upper-inner quadrant of right female breast ([place])  ?11/15/2021 Initial Diagnosis  ? Malignant neoplasm of upper-inner quadrant of right female breast [facility]) ?  ?11/16/2021 Cancer Staging  ? Staging form: Breast, AJCC 8th Edition ?- Clinical stage from 11/16/2021: cT1c, cN0, cM0, G2 - Signed by Benay Pike, MD on 11/16/2021 ?Stage prefix: Initial diagnosis ?Method of lymph node assessment: Clinical ?Histologic grading system: 3 grade system ? ?  ?12/01/2021 Genetic Testing  ? Negative hereditary cancer genetic testing: no pathogenic variants detected in [facility] CustomNext-Cancer +RNAinsight Panel.  The report date is 11/30/2021.  ? ?The CustomNext-Cancer+RNAinsight panel offered by [facility] includes sequencing and rearrangement analysis for the following 47 genes:  APC, ATM, AXIN2, BARD1, BMPR1A, BRCA1, BRCA2, BRIP1, CDH1, CDK4, CDKN2A, CHEK2, DICER1, EPCAM, GREM1, HOXB13, MEN1, MLH1, MSH2, MSH3, MSH6, MUTYH, NBN, NF1, NF2, NTHL1, PALB2, PMS2, POLD1, POLE, PTEN, RAD51C, RAD51D, RECQL, RET, SDHA, SDHAF2, SDHB, SDHC, SDHD, SMAD4, SMARCA4, STK11, TP53, TSC1, TSC2, and VHL.  RNA data is routinely analyzed for use in variant interpretation for all genes. ?  ?Malignant neoplasm of upper-outer quadrant of left female breast ([place])  ?11/15/2021 Initial Diagnosis  ? Malignant neoplasm of upper-outer quadrant of left female breast ([place]) ?  ?  11/16/2021 Cancer Staging  ? Staging form: Breast, AJCC 8th Edition ?-  Clinical stage from 11/16/2021: cT1a, cN0, cM0, G2, ER+, PR+, HER2: Unknown - Signed by Benay Pike, MD on 11/16/2021 ?Stage prefix: Initial diagnosis ?Method of lymph node assessment: Clinical ?Histologic grading system: 3 grade system ? ?  ?11/24/2021 Surgery  ? Right breast lumpectomy showed invasive lobular carcinoma measuring 2.7 cm, mammary carcinoma in situ, all surgical margins negative for carcinoma ?Invasive carcinoma is 0.3 cm from superior margin prognostics on this tumor showed ER 100% positive, strong staining, PR 100% positive, strong staining, HER2 equivocal with IHC and negative with FISH ratio 1.31, Ki-67 of 5% from the original biopsy. ?Left breast lumpectomy showed invasive ductal carcinoma with lobular features measuring 1.3 cm, mammary carcinoma in situ, all surgical margins negative for carcinoma, invasive carcinoma is 0.3 cm from medial and anterior margins, prognostics on this tumor show up ER +100% strong staining PR 100% positive strong staining and HER2 equivocal with IHC and negative by FISH, Ki-67 of 1%. ?  ?12/01/2021 Genetic Testing  ? Negative hereditary cancer genetic testing: no pathogenic variants detected in [facility] CustomNext-Cancer +RNAinsight Panel.  The report date is 11/30/2021.  ? ?The CustomNext-Cancer+RNAinsight panel offered by [facility] includes sequencing and rearrangement analysis for the following 47 genes:  APC, ATM, AXIN2, BARD1, BMPR1A, BRCA1, BRCA2, BRIP1, CDH1, CDK4, CDKN2A, CHEK2, DICER1, EPCAM, GREM1, HOXB13, MEN1, MLH1, MSH2, MSH3, MSH6, MUTYH, NBN, NF1, NF2, NTHL1, PALB2, PMS2, POLD1, POLE, PTEN, RAD51C, RAD51D, RECQL, RET, SDHA, SDHAF2, SDHB, SDHC, SDHD, SMAD4, SMARCA4, STK11, TP53, TSC1, TSC2, and VHL.  RNA data is routinely analyzed for use in variant interpretation for all genes. ?  ? ?She is undergoing adjuvant radiation and tolerating it well. ?She denies any complaints. Rest of the pertinent 10 point ROS reviewed and negative ? ?MEDICAL HISTORY:  ?Past  Medical History:  ?Diagnosis Date  ? Allergy   ? Cancer [facility])   ? Cataract   ? Chronic kidney disease   ? GERD (gastroesophageal reflux disease)   ? Hyperlipidemia   ? Hypertension   ? Pre-diabetes   ? Sleep apnea   ? Stroke [facility])   ? ruptured aneurysm  ? ? ?SURGICAL HISTORY: ?Past Surgical History:  ?Procedure Laterality Date  ? APPENDECTOMY  1963  ? BACK SURGERY  2007  ? BREAST LUMPECTOMY WITH RADIOACTIVE SEED LOCALIZATION Bilateral 11/24/2021  ? Procedure: BILATERAL BREAST LUMPECTOMY WITH RADIOACTIVE SEED LOCALIZATION;  Surgeon: Stark Klein, MD;  Location: [facility];  Service: General;  Laterality: Bilateral;  ? BREAST SURGERY    ? DILATION AND CURETTAGE OF UTERUS    ? [place]  ? bilateral cataract extraction  ? moles  1965  ? SPINE SURGERY    ? TONSILLECTOMY  1959  ? ? ?SOCIAL HISTORY: ?Social History  ? ?Socioeconomic History  ? Marital status: Single  ?  Spouse name: Not on file  ? Number of children: Not on file  ? Years of education: Not on file  ? Highest education level: Not on file  ?Occupational History  ? Occupation: retired  ?Tobacco Use  ? Smoking status: Never  ? Smokeless tobacco: Never  ?Vaping Use  ? Vaping Use: Never used  ?Substance and Sexual Activity  ? Alcohol use: Yes  ?  Alcohol/week: 1.0 standard drink  ?  Types: 1 Glasses of wine per week  ?  Comment: 1 every 6 mts   ? Drug use: No  ? Sexual activity: Never  ?Other Topics  Concern  ? Not on file  ?Social History Narrative  ? Walks daily. Divorced. Education: [employer].  ?   ? Drinks 4 cups of caffeine daily.  ? ?Social Determinants of Health  ? ?Financial Resource Strain: Low Risk   ? Difficulty of Paying Living Expenses: Not hard at all  ?Food Insecurity: No Food Insecurity  ? Worried About Charity fundraiser in the Last Year: Never true  ? Ran Out of Food in the Last Year: Never true  ?Transportation Needs: No Transportation Needs  ? Lack of Transportation (Medical): No  ? Lack of Transportation (Non-Medical): No  ?Physical  Activity: Not on file  ?Stress: Not on file  ?Social Connections: Not on file  ?Intimate Partner Violence: Not on file  ? ? ?FAMILY HISTORY: ?Family History  ?Problem Relation Age of Onset  ? Vaginal cancer [place]

## 2022-01-22 ENCOUNTER — Encounter: Payer: Self-pay | Admitting: Hematology and Oncology

## 2022-01-22 DIAGNOSIS — C50211 Malignant neoplasm of upper-inner quadrant of right female breast: Secondary | ICD-10-CM | POA: Diagnosis not present

## 2022-01-22 DIAGNOSIS — Z510 Encounter for antineoplastic radiation therapy: Secondary | ICD-10-CM | POA: Diagnosis not present

## 2022-01-22 DIAGNOSIS — Z170 Estrogen receptor positive status [ER+]: Secondary | ICD-10-CM | POA: Insufficient documentation

## 2022-01-22 DIAGNOSIS — C50412 Malignant neoplasm of upper-outer quadrant of left female breast: Secondary | ICD-10-CM | POA: Insufficient documentation

## 2022-01-22 NOTE — Assessment & Plan Note (Signed)
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  Given strong ER/PR positive tumor, low proliferation index, although we discussed about oncotype testing, after much discussion, patient was not interested to proceed with it. ?She is now undergoing adjuvant radiation and doing well. ?We have once again discussed mechanism of action of antiestrogen therapy including Tamoxifen and aromatase inhibitors, adverse effects from Tamoxifen/aromatase inhibitors. We discussed about adverse effects from Tamoxifen including but not limited to fatigue, post menopausal symptoms, DVT/PE, endometrial cancer, cardiovascular events. A benefit from Tamoxifen would be improvement in bone density. ?We have discussed about adverse effects from aromatase inhibitors including but not limited to mechanism of action, post menopausal symptoms, arthralgias and myalgias, bone loss and possible increased cardiovascular events. ?She will want to proceed with aromatase inhibitors. Prescription dispensed to pharmacy of her choice. ?RTC in 3/4 months for toxicity check. ?

## 2022-01-23 ENCOUNTER — Ambulatory Visit
Admission: RE | Admit: 2022-01-23 | Discharge: 2022-01-23 | Disposition: A | Discharge status: Home / Self Care | Payer: Medicare Other | Source: Ambulatory Visit | Attending: Radiation Oncology | Admitting: Radiation Oncology

## 2022-01-23 ENCOUNTER — Other Ambulatory Visit: Payer: Self-pay

## 2022-01-23 DIAGNOSIS — C50412 Malignant neoplasm of upper-outer quadrant of left female breast: Secondary | ICD-10-CM | POA: Diagnosis not present

## 2022-01-23 DIAGNOSIS — C50211 Malignant neoplasm of upper-inner quadrant of right female breast: Secondary | ICD-10-CM | POA: Diagnosis not present

## 2022-01-23 DIAGNOSIS — Z170 Estrogen receptor positive status [ER+]: Secondary | ICD-10-CM | POA: Diagnosis not present

## 2022-01-23 DIAGNOSIS — Z510 Encounter for antineoplastic radiation therapy: Secondary | ICD-10-CM | POA: Diagnosis not present

## 2022-01-24 ENCOUNTER — Ambulatory Visit
Admission: RE | Admit: 2022-01-24 | Discharge: 2022-01-24 | Disposition: A | Discharge status: Home / Self Care | Payer: Medicare Other | Source: Ambulatory Visit | Attending: Radiation Oncology | Admitting: Radiation Oncology

## 2022-01-24 DIAGNOSIS — C50211 Malignant neoplasm of upper-inner quadrant of right female breast: Secondary | ICD-10-CM | POA: Diagnosis not present

## 2022-01-24 DIAGNOSIS — Z170 Estrogen receptor positive status [ER+]: Secondary | ICD-10-CM | POA: Diagnosis not present

## 2022-01-24 DIAGNOSIS — Z510 Encounter for antineoplastic radiation therapy: Secondary | ICD-10-CM | POA: Diagnosis not present

## 2022-01-24 DIAGNOSIS — C50412 Malignant neoplasm of upper-outer quadrant of left female breast: Secondary | ICD-10-CM | POA: Diagnosis not present

## 2022-01-25 ENCOUNTER — Other Ambulatory Visit: Payer: Self-pay

## 2022-01-25 ENCOUNTER — Ambulatory Visit
Admission: RE | Admit: 2022-01-25 | Discharge: 2022-01-25 | Disposition: A | Discharge status: Home / Self Care | Payer: Medicare Other | Source: Ambulatory Visit | Attending: Radiation Oncology | Admitting: Radiation Oncology

## 2022-01-25 DIAGNOSIS — C50412 Malignant neoplasm of upper-outer quadrant of left female breast: Secondary | ICD-10-CM | POA: Diagnosis not present

## 2022-01-25 DIAGNOSIS — C50211 Malignant neoplasm of upper-inner quadrant of right female breast: Secondary | ICD-10-CM | POA: Diagnosis not present

## 2022-01-25 DIAGNOSIS — Z170 Estrogen receptor positive status [ER+]: Secondary | ICD-10-CM | POA: Diagnosis not present

## 2022-01-25 DIAGNOSIS — Z510 Encounter for antineoplastic radiation therapy: Secondary | ICD-10-CM | POA: Diagnosis not present

## 2022-01-26 ENCOUNTER — Ambulatory Visit
Admission: RE | Admit: 2022-01-26 | Discharge: 2022-01-26 | Disposition: A | Discharge status: Home / Self Care | Payer: Medicare Other | Source: Ambulatory Visit | Attending: Radiation Oncology | Admitting: Radiation Oncology

## 2022-01-26 DIAGNOSIS — Z510 Encounter for antineoplastic radiation therapy: Secondary | ICD-10-CM | POA: Diagnosis not present

## 2022-01-26 DIAGNOSIS — Z170 Estrogen receptor positive status [ER+]: Secondary | ICD-10-CM | POA: Diagnosis not present

## 2022-01-26 DIAGNOSIS — C50412 Malignant neoplasm of upper-outer quadrant of left female breast: Secondary | ICD-10-CM | POA: Diagnosis not present

## 2022-01-26 DIAGNOSIS — C50211 Malignant neoplasm of upper-inner quadrant of right female breast: Secondary | ICD-10-CM | POA: Diagnosis not present

## 2022-01-27 ENCOUNTER — Encounter: Payer: Self-pay | Admitting: Radiation Oncology

## 2022-01-27 ENCOUNTER — Encounter: Payer: Self-pay | Admitting: *Deleted

## 2022-01-27 ENCOUNTER — Ambulatory Visit
Admission: RE | Admit: 2022-01-27 | Discharge: 2022-01-27 | Disposition: A | Discharge status: Home / Self Care | Payer: Medicare Other | Source: Ambulatory Visit | Attending: Radiation Oncology | Admitting: Radiation Oncology

## 2022-01-27 DIAGNOSIS — Z170 Estrogen receptor positive status [ER+]: Secondary | ICD-10-CM | POA: Diagnosis not present

## 2022-01-27 DIAGNOSIS — C50211 Malignant neoplasm of upper-inner quadrant of right female breast: Secondary | ICD-10-CM | POA: Diagnosis not present

## 2022-01-27 DIAGNOSIS — C50412 Malignant neoplasm of upper-outer quadrant of left female breast: Secondary | ICD-10-CM | POA: Diagnosis not present

## 2022-01-27 DIAGNOSIS — Z510 Encounter for antineoplastic radiation therapy: Secondary | ICD-10-CM | POA: Diagnosis not present

## 2022-01-31 DIAGNOSIS — M25562 Pain in left knee: Secondary | ICD-10-CM | POA: Diagnosis not present

## 2022-02-01 ENCOUNTER — Other Ambulatory Visit: Payer: Self-pay

## 2022-02-01 ENCOUNTER — Telehealth: Payer: Self-pay

## 2022-02-01 DIAGNOSIS — Z170 Estrogen receptor positive status [ER+]: Secondary | ICD-10-CM

## 2022-02-01 MED ORDER — RADIAPLEXRX EX GEL: Freq: Once | CUTANEOUS | Status: DC

## 2022-02-01 NOTE — Telephone Encounter (Signed)
Received call from patient stating that she has blisters to breast. Patient requesting a tube a Radiaplex. Patient to come pick up cream today.  ?

## 2022-02-02 DIAGNOSIS — M85852 Other specified disorders of bone density and structure, left thigh: Secondary | ICD-10-CM | POA: Diagnosis not present

## 2022-02-02 DIAGNOSIS — C50911 Malignant neoplasm of unspecified site of right female breast: Secondary | ICD-10-CM | POA: Diagnosis not present

## 2022-02-02 DIAGNOSIS — K219 Gastro-esophageal reflux disease without esophagitis: Secondary | ICD-10-CM | POA: Diagnosis not present

## 2022-02-02 DIAGNOSIS — I10 Essential (primary) hypertension: Secondary | ICD-10-CM | POA: Diagnosis not present

## 2022-02-02 DIAGNOSIS — E7800 Pure hypercholesterolemia, unspecified: Secondary | ICD-10-CM | POA: Diagnosis not present

## 2022-02-02 DIAGNOSIS — Z8673 Personal history of transient ischemic attack (TIA), and cerebral infarction without residual deficits: Secondary | ICD-10-CM | POA: Diagnosis not present

## 2022-02-02 DIAGNOSIS — G4730 Sleep apnea, unspecified: Secondary | ICD-10-CM | POA: Diagnosis not present

## 2022-02-02 DIAGNOSIS — G4700 Insomnia, unspecified: Secondary | ICD-10-CM | POA: Diagnosis not present

## 2022-02-02 DIAGNOSIS — M25562 Pain in left knee: Secondary | ICD-10-CM | POA: Diagnosis not present

## 2022-02-02 DIAGNOSIS — Z87448 Personal history of other diseases of urinary system: Secondary | ICD-10-CM | POA: Diagnosis not present

## 2022-02-02 DIAGNOSIS — N1831 Chronic kidney disease, stage 3a: Secondary | ICD-10-CM | POA: Diagnosis not present

## 2022-02-02 DIAGNOSIS — R7303 Prediabetes: Secondary | ICD-10-CM | POA: Diagnosis not present

## 2022-02-15 ENCOUNTER — Telehealth: Payer: Self-pay | Admitting: *Deleted

## 2022-02-15 NOTE — Telephone Encounter (Signed)
This RN returned call per VM from pt- obtained identified VM- message left to call again. ?

## 2022-02-16 ENCOUNTER — Other Ambulatory Visit: Payer: Self-pay | Admitting: *Deleted

## 2022-02-16 MED ORDER — ANASTROZOLE 1 MG PO TABS: 1.0000 mg | ORAL_TABLET | Freq: Every day | ORAL | 3 refills | Status: DC

## 2022-02-22 NOTE — Progress Notes (Signed)
? ?                                                                                                                                                          ?  Patient Name: Tracey Richard ?MRN: [id number] ?DOB: 1940-11-24 ?Referring Physician: Moreen Fowler DAVID (Profile Not Attached) ?Date of Service: 01/27/2022 ?[facility], [place] ? ?                                                      End Of Treatment Note ? ?Diagnoses: C50.211-Malignant neoplasm of upper-inner quadrant of right female breast ?C50.412-Malignant neoplasm of upper-outer quadrant of left female breast ? 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   ? ?Intent: Curative ? ?Radiation Treatment Dates: 01/02/2022 through 01/27/2022 ?Site Technique Total Dose (Gy) Dose per Fx (Gy) Completed Fx Beam Energies  ?Breast, Right: Breast_R 3D 42.56/42.56 2.66 16/16 6X  ?Breast, Right: Breast_R_Bst 3D 8/8 2 4/4 6X, 10X  ?Breast, Left: Breast_L 3D 42.56/42.56 2.66 16/16 10X  ?Breast, Left: Breast_L_Bst 3D 8/8 2 4/4 6X, 10X  ? ?Narrative: The patient tolerated radiation therapy relatively well. She developed anticipated skin changes in the treatment field but no fatigue. ? ?Plan: The patient will receive a call in about one month from the radiation oncology department. She will continue follow up with Dr. Chryl Heck as well.  ? ?________________________________________________ ? ? ? ?Carola Rhine, PAC  ?

## 2022-03-13 ENCOUNTER — Ambulatory Visit
Admission: RE | Admit: 2022-03-13 | Discharge: 2022-03-13 | Disposition: A | Discharge status: Home / Self Care | Payer: Medicare Other | Source: Ambulatory Visit | Attending: Hematology and Oncology | Admitting: Hematology and Oncology

## 2022-03-13 DIAGNOSIS — C50211 Malignant neoplasm of upper-inner quadrant of right female breast: Secondary | ICD-10-CM

## 2022-03-13 DIAGNOSIS — Z170 Estrogen receptor positive status [ER+]: Secondary | ICD-10-CM

## 2022-03-13 NOTE — Progress Notes (Addendum)
  Radiation Oncology         (336) [phone] ________________________________  Name: Tracey Richard MRN: [id number]  Date of Service: 03/13/2022  DOB: [birth year]  Post Treatment Telephone Note  Diagnosis:   Stage IA, pT1c, cN0M0 grade 1, ER/PR positive invasive ductal carcinoma with lobular component of the left breast with Synchronous  Stage IA, cT2, cN0M0, ER/PR positive invasive lobular carcinoma of the right breast.    Intent: Curative  Radiation Treatment Dates: 01/02/2022 through 01/27/2022 Site Technique Total Dose (Gy) Dose per Fx (Gy) Completed Fx Beam Energies  Breast, Right: Breast_R 3D 42.56/42.56 2.66 16/16 6X  Breast, Right: Breast_R_Bst 3D 8/8 2 4/4 6X, 10X  Breast, Left: Breast_L 3D 42.56/42.56 2.66 16/16 10X  Breast, Left: Breast_L_Bst 3D 8/8 2 4/4 6X, 10X   Narrative: The patient tolerated radiation therapy relatively well. She developed anticipated skin changes in the treatment field but no fatigue. She reports her skin is back to normal appearance. After treatment, she developed some fatigue but this has resolved in the past week or so.   Impression/Plan: 1. 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 The patient has been doing well since completion of radiotherapy. We discussed that we would be happy to continue to follow her as needed, but she will also continue to follow up with Dr. Chryl Heck in medical oncology. She was counseled on skin care as well as measures to avoid sun exposure to this area.         Carola Rhine, PAC

## 2022-05-03 DIAGNOSIS — H35371 Puckering of macula, right eye: Secondary | ICD-10-CM | POA: Diagnosis not present

## 2022-05-03 DIAGNOSIS — H43813 Vitreous degeneration, bilateral: Secondary | ICD-10-CM | POA: Diagnosis not present

## 2022-05-03 DIAGNOSIS — Z961 Presence of intraocular lens: Secondary | ICD-10-CM | POA: Diagnosis not present

## 2022-05-03 DIAGNOSIS — H40013 Open angle with borderline findings, low risk, bilateral: Secondary | ICD-10-CM | POA: Diagnosis not present

## 2022-05-22 ENCOUNTER — Ambulatory Visit: Payer: Medicare Other | Admitting: Hematology and Oncology

## 2022-05-22 ENCOUNTER — Other Ambulatory Visit: Payer: Self-pay

## 2022-05-22 ENCOUNTER — Encounter: Payer: Self-pay | Admitting: Adult Health

## 2022-05-22 ENCOUNTER — Inpatient Hospital Stay: Payer: Medicare Other | Attending: Hematology and Oncology | Admitting: Adult Health

## 2022-05-22 VITALS — BP 143/73 | HR 100 | Temp 98.1°F | Resp 18 | Ht 63.0 in | Wt 169.8 lb

## 2022-05-22 DIAGNOSIS — Z79899 Other long term (current) drug therapy: Secondary | ICD-10-CM | POA: Insufficient documentation

## 2022-05-22 DIAGNOSIS — N951 Menopausal and female climacteric states: Secondary | ICD-10-CM | POA: Insufficient documentation

## 2022-05-22 DIAGNOSIS — C50412 Malignant neoplasm of upper-outer quadrant of left female breast: Secondary | ICD-10-CM | POA: Diagnosis not present

## 2022-05-22 DIAGNOSIS — E2839 Other primary ovarian failure: Secondary | ICD-10-CM

## 2022-05-22 DIAGNOSIS — Z170 Estrogen receptor positive status [ER+]: Secondary | ICD-10-CM | POA: Insufficient documentation

## 2022-05-22 DIAGNOSIS — Z79811 Long term (current) use of aromatase inhibitors: Secondary | ICD-10-CM | POA: Insufficient documentation

## 2022-05-22 DIAGNOSIS — M8580 Other specified disorders of bone density and structure, unspecified site: Secondary | ICD-10-CM | POA: Insufficient documentation

## 2022-05-22 DIAGNOSIS — C50211 Malignant neoplasm of upper-inner quadrant of right female breast: Secondary | ICD-10-CM | POA: Diagnosis not present

## 2022-05-22 NOTE — Progress Notes (Signed)
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   12/01/2021 Genetic Testing   Negative hereditary cancer genetic testing: no pathogenic variants detected in [facility]Next-Cancer +RNAinsight Panel.  The report date is 11/30/2021.   The CustomNext-Cancer+RNAinsight panel offered by [facility] includes sequencing and rearrangement analysis for the following 47 genes:  APC, ATM, AXIN2, BARD1, BMPR1A, BRCA1, BRCA2, BRIP1, CDH1, CDK4, CDKN2A, CHEK2, DICER1, EPCAM, GREM1, HOXB13, MEN1, MLH1, MSH2, MSH3, MSH6, MUTYH, NBN, NF1, NF2, NTHL1, PALB2, PMS2, POLD1, POLE, PTEN, RAD51C, RAD51D, RECQL, RET, SDHA, SDHAF2, SDHB, SDHC, SDHD, SMAD4, SMARCA4, STK11, TP53, TSC1, TSC2, and VHL.  RNA data is routinely analyzed for use in variant interpretation for all genes.   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[place])  11/15/2021 Initial Diagnosis   Malignant neoplasm of upper-outer quadrant of left female breast ([place])   11/16/2021 Cancer Staging   Staging form: Breast, AJCC 8th Edition - Clinical stage from 11/16/2021: cT1a, cN0, cM0, G2, ER+, PR+, HER2: Unknown - Signed by Benay Pike, MD on 11/16/2021 Stage prefix: Initial diagnosis Method of lymph node assessment: Clinical Histologic grading system: 3 grade system   11/24/2021 Surgery   Right breast lumpectomy showed invasive lobular carcinoma measuring 2.7 cm, mammary carcinoma in situ, all surgical margins negative for carcinoma Invasive carcinoma is 0.3 cm from superior margin prognostics on this tumor showed ER 100% positive, strong staining, PR 100% positive, strong staining, HER2 equivocal with IHC and negative with FISH ratio 1.31, Ki-67 of 5% from the original biopsy. 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   12/01/2021 Genetic Testing   Negative hereditary cancer genetic testing: no pathogenic variants detected in [facility]Next-Cancer +RNAinsight Panel.  The report date is 11/30/2021.   The CustomNext-Cancer+RNAinsight panel offered by [facility] includes sequencing and rearrangement analysis for the  following 47 genes:  APC, ATM, AXIN2, BARD1, BMPR1A, BRCA1, BRCA2, BRIP1,  CDH1, CDK4, CDKN2A, CHEK2, DICER1, EPCAM, GREM1, HOXB13, MEN1, MLH1, MSH2, MSH3, MSH6, MUTYH, NBN, NF1, NF2, NTHL1, PALB2, PMS2, POLD1, POLE, PTEN, RAD51C, RAD51D, RECQL, RET, SDHA, SDHAF2, SDHB, SDHC, SDHD, SMAD4, SMARCA4, STK11, TP53, TSC1, TSC2, and VHL.  RNA data is routinely analyzed for use in variant interpretation for all genes.   01/02/2022 - 01/27/2022 Radiation Therapy   Site Technique Total Dose (Gy) Dose per Fx (Gy) Completed Fx Beam Energies  Breast, Right: Breast_R 3D 42.56/42.56 2.66 16/16 6X  Breast, Right: Breast_R_Bst 3D 8/8 2 4/4 6X, 10X  Breast, Left: Breast_L 3D 42.56/42.56 2.66 16/16 10X  Breast, Left: Breast_L_Bst 3D 8/8 2 4/4 6X, 10X     01/2022 -  Anti-estrogen oral therapy   Anastrozole daily   05/22/2022 Cancer Staging   Staging form: Breast, AJCC 8th Edition - Pathologic: Stage IA (pT1c, pN0, cM0, G1, ER+, PR+, HER2-) - Signed by Gardenia Phlegm, NP on 05/22/2022 Histologic grading system: 3 grade system     INTERVAL HISTORY:  Ms. Flammia to review her survivorship care plan detailing her treatment course for breast cancer, as well as monitoring long-term side effects of that treatment, education regarding health maintenance, screening, and overall wellness and health promotion.     Overall, Ms. Sievert reports feeling quite well.  She is taking anastrozole daily and notes some mild hair thinning and some hot flashes but both of these are manageable.  REVIEW OF SYSTEMS:  Review of Systems  Constitutional:  Negative for appetite change, chills, fatigue, fever and unexpected weight change.  HENT:   Negative for hearing loss, lump/mass and trouble swallowing.   Eyes:  Negative for eye problems and icterus.  Respiratory:  Negative for chest tightness, cough and shortness of breath.   Cardiovascular:  Negative for chest pain, leg swelling and palpitations.  Gastrointestinal:  Negative for abdominal distention, abdominal pain, constipation, diarrhea,  nausea and vomiting.  Endocrine: Positive for hot flashes.  Genitourinary:  Negative for difficulty urinating.   Musculoskeletal:  Negative for arthralgias.  Skin:  Negative for itching and rash.  Neurological:  Negative for dizziness, extremity weakness, headaches and numbness.  Hematological:  Negative for adenopathy. Does not bruise/bleed easily.  Psychiatric/Behavioral:  Negative for depression. The patient is not nervous/anxious.   Breast: Denies any new nodularity, masses, tenderness, nipple changes, or nipple discharge.      ONCOLOGY TREATMENT TEAM:  1. Surgeon:  Dr. Barry Dienes at [facility] 2. Medical Oncologist: Dr. Chryl Heck  3. Radiation Oncologist: Dr. Lisbeth Renshaw    PAST MEDICAL/SURGICAL HISTORY:  Past Medical History:  Diagnosis Date   Allergy    Cancer [facility])    Cataract    Chronic kidney disease    GERD (gastroesophageal reflux disease)    Hyperlipidemia    Hypertension    Pre-diabetes    Sleep apnea    Stroke [facility])    ruptured aneurysm   Past Surgical History:  Procedure Laterality Date   APPENDECTOMY  1963   BACK SURGERY  2007   BREAST LUMPECTOMY WITH RADIOACTIVE SEED LOCALIZATION Bilateral 11/24/2021   Procedure: BILATERAL BREAST LUMPECTOMY WITH RADIOACTIVE SEED LOCALIZATION;  Surgeon: Stark Klein, MD;  Location: [facility];  Service: General;  Laterality: Bilateral;   BREAST SURGERY     DILATION AND CURETTAGE OF UTERUS     EYE SURGERY  1999   bilateral cataract extraction   moles  [place]  TONSILLECTOMY  1959     ALLERGIES:  Allergies  Allergen Reactions   Elemental Sulfur Other (See Comments)    Childhood    Penicillins Other (See Comments)    Childhood    Oxycodone Hcl Itching   Tape Itching     CURRENT MEDICATIONS:  Outpatient Encounter Medications as of 05/22/2022  Medication Sig   acetaminophen (TYLENOL) 500 MG tablet Take 500-1,000 mg by mouth every 6 (six) hours as needed (pain.).   anastrozole (ARIMIDEX) 1 MG  tablet Take 1 tablet (1 mg total) by mouth daily.   Ascorbic Acid (VITAMIN C) 1000 MG tablet Take 1,000 mg by mouth daily at 4 PM.   aspirin EC 81 MG tablet Take 81 mg by mouth daily.   Calcium Carb-Cholecalciferol (CALCIUM + D3) 600-800 MG-UNIT TABS Take 1 tablet by mouth daily after breakfast.   hydroxypropyl methylcellulose / hypromellose (ISOPTO TEARS / GONIOVISC) 2.5 % ophthalmic solution Place 1-2 drops into both eyes 3 (three) times daily as needed for dry eyes.   imipramine (TOFRANIL) 25 MG tablet TAKE THREE TABLETS BY MOUTH EVERY NIGHT AT BEDTIME   losartan (COZAAR) 50 MG tablet Take 50 mg by mouth at bedtime.   polyethylene glycol (MIRALAX / GLYCOLAX) 17 g packet Take 17 g by mouth daily at 4 PM.   simvastatin (ZOCOR) 40 MG tablet TAKE 1 TABLET (40 MG TOTAL) BY MOUTH DAILY AT BEDTIME   Wheat Dextrin (BENEFIBER DRINK MIX PO) Take 1 Dose by mouth daily at 4 PM. 2 teaspoons   [DISCONTINUED] esomeprazole (NEXIUM) 20 MG capsule Take 20 mg by mouth daily as needed (acid reflux/indigestion). (Patient not taking: Reported on 12/22/2021)   [DISCONTINUED] HYDROcodone-acetaminophen (NORCO/VICODIN) 5-325 MG tablet Take 1 tablet by mouth every 6 (six) hours as needed for moderate pain. (Patient not taking: Reported on 12/22/2021)   [DISCONTINUED] Multiple Vitamins-Minerals (ZINC PO) Take 50 mg by mouth daily at 4 PM. (Patient not taking: Reported on 05/22/2022)   No facility-administered encounter medications on file as of 05/22/2022.     ONCOLOGIC FAMILY HISTORY:  Family History  Problem Relation Age of Onset   Vaginal cancer Mother        d. 23   Hypertension Mother    Prostate cancer Father        d. 81   Hypertension Father    Dementia Father    Diabetes Brother    Lung cancer Paternal Grandmother        dx after 88   Breast cancer Other        PGF's sister; dx after 65   Sleep apnea Neg Hx     SOCIAL HISTORY:  Social History   Socioeconomic History   Marital status: Single     Spouse name: Not on file   Number of children: Not on file   Years of education: Not on file   Highest education level: Not on file  Occupational History   Occupation: retired  Tobacco Use   Smoking status: Never   Smokeless tobacco: Never  Vaping Use   Vaping Use: Never used  Substance and Sexual Activity   Alcohol use: Yes    Alcohol/week: 1.0 standard drink of alcohol    Types: 1 Glasses of wine per week    Comment: 1 every 6 mts    Drug use: No   Sexual activity: Never  Other Topics Concern   Not on file  Social History Narrative   Walks daily. Divorced. Education: [employer].  Drinks 4 cups of caffeine daily.   Social Determinants of Health   Financial Resource Strain: Low Risk  (11/16/2021)   Overall Financial Resource Strain (CARDIA)    Difficulty of Paying Living Expenses: Not hard at all  Food Insecurity: No Food Insecurity (11/16/2021)   Hunger Vital Sign    Worried About Running Out of Food in the Last Year: Never true    Ran Out of Food in the Last Year: Never true  Transportation Needs: No Transportation Needs (11/16/2021)   PRAPARE - Hydrologist (Medical): No    Lack of Transportation (Non-Medical): No  Physical Activity: Not on file  Stress: Not on file  Social Connections: Not on file  Intimate Partner Violence: Not on file     OBSERVATIONS/OBJECTIVE:  BP (!) 143/73 (BP Location: Left Arm, Patient Position: Sitting)   Pulse 100   Temp 98.1 F (36.7 C) (Temporal)   Resp 18   Ht '5\' 3"'  (1.6 m)   Wt 169 lb 12.8 oz (77 kg)   SpO2 95%   BMI 30.08 kg/m  GENERAL: Patient is a well appearing female in no acute distress HEENT:  Sclerae anicteric.  Oropharynx clear and moist. No ulcerations or evidence of oropharyngeal candidiasis. Neck is supple.  NODES:  No cervical, supraclavicular, or axillary lymphadenopathy palpated.  BREAST EXAM: Breasts are status post lumpectomy and radiation bilaterally.  No sign of local  recurrence. LUNGS:  Clear to auscultation bilaterally.  No wheezes or rhonchi. HEART:  Regular rate and rhythm. No murmur appreciated. ABDOMEN:  Soft, nontender.  Positive, normoactive bowel sounds. No organomegaly palpated. MSK:  No focal spinal tenderness to palpation. Full range of motion bilaterally in the upper extremities. EXTREMITIES:  No peripheral edema.   SKIN:  Clear with no obvious rashes or skin changes. No nail dyscrasia. NEURO:  Nonfocal. Well oriented.  Appropriate affect.   LABORATORY DATA:  None for this visit.  DIAGNOSTIC IMAGING:  None for this visit.      ASSESSMENT AND PLAN:  Ms.. Pressly is a pleasant 81 y.o. female with bilateral breast cancer ER+/PR+/HER2-, diagnosed in January 2023, treated with lumpectomy, adjuvant radiation therapy, and anti-estrogen therapy with anastrozole beginning in April 2023.  She presents to the Survivorship Clinic for our initial meeting and routine follow-up post-completion of treatment for breast cancer.    1.  Bilateral estrogen positive breast cancer:  Ms. Quattrone is continuing to recover from definitive treatment for breast cancer. She will follow-up with her medical oncologist, Dr. Chryl Heck in 6 months with history and physical exam per surveillance protocol.  She will continue her anti-estrogen therapy with anastrozole. Thus far, she is tolerating the anastrozole well, with minimal side effects. Her mammogram is due December 2023; orders placed today.    Today, a comprehensive survivorship care plan and treatment summary was reviewed with the patient today detailing her breast cancer diagnosis, treatment course, potential late/long-term effects of treatment, appropriate follow-up care with recommendations for the future, and patient education resources.  A copy of this summary, along with a letter will be sent to the patient's primary care provider via mail/fax/In Basket message after today's visit.    2. Bone health:  Given  Ms. Dechellis's age/history of breast cancer and her current treatment regimen including anti-estrogen therapy with Anastrozole, she is at risk for bone demineralization.  Her last DEXA scan was 01/23/2020, which showed mild osteopenia with a T score of -1.2.  She is due for repeat which  I ordered. She was given education on specific activities to promote bone health.  3. Cancer screening:  Due to Ms. Coltrane's history and her age, she should receive screening for skin cancers.  The information and recommendations are listed on the patient's comprehensive care plan/treatment summary and were reviewed in detail with the patient.    4. Health maintenance and wellness promotion: Ms. Capurro was encouraged to consume 5-7 servings of fruits and vegetables per day. We reviewed the "Nutrition Rainbow" handout.  She was also encouraged to engage in moderate to vigorous exercise for 30 minutes per day most days of the week. We discussed the [employer] fitness program, which is designed for cancer survivors to help them become more physically fit after cancer treatments.  She was instructed to limit her alcohol consumption and continue to abstain from tobacco use.     5. Support services/counseling: It is not uncommon for this period of the patient's cancer care trajectory to be one of many emotions and stressors. She was given information regarding our available services and encouraged to contact me with any questions or for help enrolling in any of our support group/programs.    Follow up instructions:    -Return to cancer center in 6 months for f/u with Dr. Chryl Heck -Mammogram due in 09/2022 -Bone density in 09/2022 -Follow up with surgery in 1 year -She is welcome to return back to the Survivorship Clinic at any time; no additional follow-up needed at this time.  -Consider referral back to survivorship as a long-term survivor for continued surveillance  The patient was provided an opportunity to  ask questions and all were answered. The patient agreed with the plan and demonstrated an understanding of the instructions.   Total encounter time:40 minutes*in face-to-face visit time, chart review, lab review, care coordination, order entry, and documentation of the encounter time.  Wilber Bihari, NP 05/22/22 2:49 PM Medical Oncology and Hematology [facility] [place], [place] [address] Tel. [phone]    Fax. [phone]  *Total Encounter Time as defined by the Centers for Medicare and Medicaid Services includes, in addition to the face-to-face time of a patient visit (documented in the note above) non-face-to-face time: obtaining and reviewing outside history, ordering and reviewing medications, tests or procedures, care coordination (communications with other health care professionals or caregivers) and documentation in the medical record.

## 2022-05-25 NOTE — Progress Notes (Signed)
Per Mendel Ryder, NP- Faxed MM and Bone density orders to [facility] [phone], confirmed.

## 2022-06-01 DIAGNOSIS — K219 Gastro-esophageal reflux disease without esophagitis: Secondary | ICD-10-CM | POA: Diagnosis not present

## 2022-06-01 DIAGNOSIS — N1831 Chronic kidney disease, stage 3a: Secondary | ICD-10-CM | POA: Diagnosis not present

## 2022-06-01 DIAGNOSIS — I10 Essential (primary) hypertension: Secondary | ICD-10-CM | POA: Diagnosis not present

## 2022-06-01 DIAGNOSIS — E7800 Pure hypercholesterolemia, unspecified: Secondary | ICD-10-CM | POA: Diagnosis not present

## 2022-06-05 DIAGNOSIS — D3001 Benign neoplasm of right kidney: Secondary | ICD-10-CM | POA: Diagnosis not present

## 2022-10-02 DIAGNOSIS — I10 Essential (primary) hypertension: Secondary | ICD-10-CM | POA: Diagnosis not present

## 2022-10-02 DIAGNOSIS — Z0000 Encounter for general adult medical examination without abnormal findings: Secondary | ICD-10-CM | POA: Diagnosis not present

## 2022-10-02 DIAGNOSIS — Z8673 Personal history of transient ischemic attack (TIA), and cerebral infarction without residual deficits: Secondary | ICD-10-CM | POA: Diagnosis not present

## 2022-10-02 DIAGNOSIS — N1831 Chronic kidney disease, stage 3a: Secondary | ICD-10-CM | POA: Diagnosis not present

## 2022-10-02 DIAGNOSIS — Z87448 Personal history of other diseases of urinary system: Secondary | ICD-10-CM | POA: Diagnosis not present

## 2022-10-02 DIAGNOSIS — C50911 Malignant neoplasm of unspecified site of right female breast: Secondary | ICD-10-CM | POA: Diagnosis not present

## 2022-10-02 DIAGNOSIS — Z23 Encounter for immunization: Secondary | ICD-10-CM | POA: Diagnosis not present

## 2022-10-02 DIAGNOSIS — R7303 Prediabetes: Secondary | ICD-10-CM | POA: Diagnosis not present

## 2022-10-02 DIAGNOSIS — M85852 Other specified disorders of bone density and structure, left thigh: Secondary | ICD-10-CM | POA: Diagnosis not present

## 2022-10-02 DIAGNOSIS — E7800 Pure hypercholesterolemia, unspecified: Secondary | ICD-10-CM | POA: Diagnosis not present

## 2022-10-02 DIAGNOSIS — K219 Gastro-esophageal reflux disease without esophagitis: Secondary | ICD-10-CM | POA: Diagnosis not present

## 2022-10-09 DIAGNOSIS — M25562 Pain in left knee: Secondary | ICD-10-CM | POA: Diagnosis not present

## 2022-10-30 DIAGNOSIS — Z853 Personal history of malignant neoplasm of breast: Secondary | ICD-10-CM | POA: Diagnosis not present

## 2022-11-08 DIAGNOSIS — M8589 Other specified disorders of bone density and structure, multiple sites: Secondary | ICD-10-CM | POA: Diagnosis not present

## 2022-11-08 DIAGNOSIS — Z780 Asymptomatic menopausal state: Secondary | ICD-10-CM | POA: Diagnosis not present

## 2022-11-08 LAB — HM DEXA SCAN

## 2022-11-27 ENCOUNTER — Other Ambulatory Visit: Payer: Self-pay | Admitting: Gastroenterology

## 2022-11-27 DIAGNOSIS — Z1211 Encounter for screening for malignant neoplasm of colon: Secondary | ICD-10-CM

## 2022-11-30 ENCOUNTER — Encounter: Payer: Self-pay | Admitting: Hematology and Oncology

## 2022-11-30 ENCOUNTER — Inpatient Hospital Stay: Payer: Medicare Other | Attending: Hematology and Oncology | Admitting: Hematology and Oncology

## 2022-11-30 ENCOUNTER — Other Ambulatory Visit: Payer: Self-pay

## 2022-11-30 VITALS — BP 130/80 | HR 71 | Temp 97.9°F | Resp 16 | Ht 63.0 in | Wt 173.1 lb

## 2022-11-30 DIAGNOSIS — C50412 Malignant neoplasm of upper-outer quadrant of left female breast: Secondary | ICD-10-CM | POA: Insufficient documentation

## 2022-11-30 DIAGNOSIS — Z923 Personal history of irradiation: Secondary | ICD-10-CM | POA: Diagnosis not present

## 2022-11-30 DIAGNOSIS — M8580 Other specified disorders of bone density and structure, unspecified site: Secondary | ICD-10-CM | POA: Diagnosis not present

## 2022-11-30 DIAGNOSIS — Z79811 Long term (current) use of aromatase inhibitors: Secondary | ICD-10-CM | POA: Insufficient documentation

## 2022-11-30 DIAGNOSIS — C50211 Malignant neoplasm of upper-inner quadrant of right female breast: Secondary | ICD-10-CM | POA: Insufficient documentation

## 2022-11-30 DIAGNOSIS — Z79899 Other long term (current) drug therapy: Secondary | ICD-10-CM | POA: Diagnosis not present

## 2022-11-30 DIAGNOSIS — Z170 Estrogen receptor positive status [ER+]: Secondary | ICD-10-CM | POA: Diagnosis not present

## 2022-11-30 DIAGNOSIS — M7989 Other specified soft tissue disorders: Secondary | ICD-10-CM | POA: Insufficient documentation

## 2022-11-30 NOTE — Progress Notes (Signed)
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   12/01/2021 Genetic Testing   Negative hereditary cancer genetic testing: no pathogenic variants detected in [facility]Next-Cancer +RNAinsight Panel.  The report date is 11/30/2021.   The CustomNext-Cancer+RNAinsight panel offered by [facility] includes sequencing and rearrangement analysis for the following 47 genes:  APC, ATM, AXIN2, BARD1, BMPR1A, BRCA1, BRCA2, BRIP1, CDH1, CDK4, CDKN2A, CHEK2, DICER1, EPCAM, GREM1, HOXB13, MEN1, MLH1, MSH2, MSH3, MSH6, MUTYH, NBN, NF1, NF2, NTHL1, PALB2, PMS2, POLD1, POLE, PTEN, RAD51C, RAD51D, RECQL, RET, SDHA, SDHAF2, SDHB, SDHC, SDHD, SMAD4, SMARCA4, STK11, TP53, TSC1, TSC2, and VHL.  RNA data is routinely analyzed for use in variant interpretation for all genes.   01/02/2022 - 01/27/2022 Radiation Therapy   Site Technique Total Dose (Gy) Dose per Fx (Gy) Completed Fx Beam Energies  Breast, Right: Breast_R 3D 42.56/42.56 2.66 16/16 6X  Breast, Right: Breast_R_Bst 3D 8/8 2 4/4 6X, 10X  Breast, Left: Breast_L 3D 42.56/42.56 2.66 16/16 10X  Breast, Left: Breast_L_Bst 3D 8/8 2 4/4 6X, 10X     01/2022 -  Anti-estrogen oral therapy   Anastrozole daily   Malignant neoplasm of  upper-outer quadrant of left female breast ([place])  11/15/2021 Initial Diagnosis   Malignant neoplasm of upper-outer quadrant of left female breast ([place])   11/16/2021 Cancer Staging   Staging form: Breast, AJCC 8th Edition - Clinical stage from 11/16/2021: cT1a, cN0, cM0, G2, ER+, PR+, HER2: Unknown - Signed by Benay Pike, MD on 11/16/2021 Stage prefix: Initial diagnosis Method of lymph node assessment: Clinical Histologic grading system: 3 grade system   11/24/2021 Surgery   Right breast lumpectomy showed invasive lobular carcinoma measuring 2.7 cm, mammary carcinoma in situ, all surgical margins negative for carcinoma Invasive carcinoma is 0.3 cm from superior margin prognostics on this tumor showed ER 100% positive, strong staining, PR 100% positive, strong staining, HER2 equivocal with IHC and negative with FISH ratio 1.31, Ki-67 of 5% from the original biopsy. 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   12/01/2021 Genetic Testing   Negative hereditary cancer genetic testing: no pathogenic variants detected in [facility]Next-Cancer +RNAinsight Panel.  The report date is 11/30/2021.   The CustomNext-Cancer+RNAinsight panel offered by [facility] includes sequencing and rearrangement analysis for the following 47 genes:  APC, ATM, AXIN2, BARD1, BMPR1A,  BRCA1, BRCA2, BRIP1, CDH1, CDK4, CDKN2A, CHEK2, DICER1, EPCAM,  GREM1, HOXB13, MEN1, MLH1, MSH2, MSH3, MSH6, MUTYH, NBN, NF1, NF2, NTHL1, PALB2, PMS2, POLD1, POLE, PTEN, RAD51C, RAD51D, RECQL, RET, SDHA, SDHAF2, SDHB, SDHC, SDHD, SMAD4, SMARCA4, STK11, TP53, TSC1, TSC2, and VHL.  RNA data is routinely analyzed for use in variant interpretation for all genes.   01/02/2022 - 01/27/2022 Radiation Therapy   Site Technique Total Dose (Gy) Dose per Fx (Gy) Completed Fx Beam Energies  Breast, Right: Breast_R 3D 42.56/42.56 2.66 16/16 6X  Breast, Right: Breast_R_Bst 3D 8/8 2 4/4 6X, 10X  Breast, Left: Breast_L 3D 42.56/42.56 2.66 16/16 10X  Breast, Left: Breast_L_Bst 3D 8/8 2 4/4 6X, 10X     01/2022 -  Anti-estrogen oral therapy   Anastrozole daily   05/22/2022 Cancer Staging   Staging form: Breast, AJCC 8th Edition - Pathologic: Stage IA (pT1c, pN0, cM0, G1, ER+, PR+, HER2-) - Signed by Gardenia Phlegm, NP on 05/22/2022 Histologic grading system: 3 grade system     INTERVAL HISTORY:   Overall, Tracey Richard reports feeling quite well.  She is taking anastrozole daily and notes some mild hair thinning and some hot flashes but both of these are manageable. She had her mammogram and bone density in [place]. She apparently got scammed and lost a big chunk of money recently and is disappointed by it. She otherwise reports some lower extremity swelling and wondered if this could be a blood clot.  Rest of the pertinent 10 point ROS reviewed and neg.  REVIEW OF SYSTEMS:  Review of Systems  Constitutional:  Negative for appetite change, chills, fatigue, fever and unexpected weight change.  HENT:   Negative for hearing loss, lump/mass and trouble swallowing.   Eyes:  Negative for eye problems and icterus.  Respiratory:  Negative for chest tightness, cough and shortness of breath.   Cardiovascular:  Negative for chest pain, leg swelling and palpitations.  Gastrointestinal:  Negative for abdominal distention, abdominal pain, constipation, diarrhea, nausea and  vomiting.  Endocrine: Positive for hot flashes.  Genitourinary:  Negative for difficulty urinating.   Musculoskeletal:  Negative for arthralgias.  Skin:  Negative for itching and rash.  Neurological:  Negative for dizziness, extremity weakness, headaches and numbness.  Hematological:  Negative for adenopathy. Does not bruise/bleed easily.  Psychiatric/Behavioral:  Negative for depression. The patient is not nervous/anxious.   Breast: Denies any new nodularity, masses, tenderness, nipple changes, or nipple discharge.      ONCOLOGY TREATMENT TEAM:  1. Surgeon:  Dr. Barry Dienes at [facility] 2. Medical Oncologist: Dr. Chryl Heck  3. Radiation Oncologist: Dr. Lisbeth Renshaw    PAST MEDICAL/SURGICAL HISTORY:  Past Medical History:  Diagnosis Date   Allergy    Cancer [facility])    Cataract    Chronic kidney disease    GERD (gastroesophageal reflux disease)    Hyperlipidemia    Hypertension    Pre-diabetes    Sleep apnea    Stroke [facility])    ruptured aneurysm   Past Surgical History:  Procedure Laterality Date   APPENDECTOMY  1963   BACK SURGERY  2007   BREAST LUMPECTOMY WITH RADIOACTIVE SEED LOCALIZATION Bilateral 11/24/2021   Procedure: BILATERAL BREAST LUMPECTOMY WITH RADIOACTIVE SEED LOCALIZATION;  Surgeon: Stark Klein, MD;  Location: [facility];  Service: General;  Laterality: Bilateral;   BREAST SURGERY     DILATION AND CURETTAGE OF UTERUS     EYE SURGERY  1999   bilateral cataract extraction   moles  1965  SPINE SURGERY     TONSILLECTOMY  1959     ALLERGIES:  Allergies  Allergen Reactions   Elemental Sulfur Other (See Comments)    Childhood    Penicillins Other (See Comments)    Childhood    Oxycodone Hcl Itching   Tape Itching     CURRENT MEDICATIONS:  Outpatient Encounter Medications as of 11/30/2022  Medication Sig   acetaminophen (TYLENOL) 500 MG tablet Take 500-1,000 mg by mouth every 6 (six) hours as needed (pain.).   anastrozole (ARIMIDEX) 1 MG tablet Take 1  tablet (1 mg total) by mouth daily.   Ascorbic Acid (VITAMIN C) 1000 MG tablet Take 1,000 mg by mouth daily at 4 PM.   aspirin EC 81 MG tablet Take 81 mg by mouth daily.   Calcium Carb-Cholecalciferol (CALCIUM + D3) 600-800 MG-UNIT TABS Take 1 tablet by mouth daily after breakfast.   hydroxypropyl methylcellulose / hypromellose (ISOPTO TEARS / GONIOVISC) 2.5 % ophthalmic solution Place 1-2 drops into both eyes 3 (three) times daily as needed for dry eyes.   imipramine (TOFRANIL) 25 MG tablet TAKE THREE TABLETS BY MOUTH EVERY NIGHT AT BEDTIME   losartan (COZAAR) 50 MG tablet Take 50 mg by mouth at bedtime.   polyethylene glycol (MIRALAX / GLYCOLAX) 17 g packet Take 17 g by mouth daily at 4 PM.   simvastatin (ZOCOR) 40 MG tablet TAKE 1 TABLET (40 MG TOTAL) BY MOUTH DAILY AT BEDTIME   Wheat Dextrin (BENEFIBER DRINK MIX PO) Take 1 Dose by mouth daily at 4 PM. 2 teaspoons   No facility-administered encounter medications on file as of 11/30/2022.     ONCOLOGIC FAMILY HISTORY:  Family History  Problem Relation Age of Onset   Vaginal cancer Mother        d. 38   Hypertension Mother    Prostate cancer Father        d. 46   Hypertension Father    Dementia Father    Diabetes Brother    Lung cancer Paternal Grandmother        dx after 47   Breast cancer Other        PGF's sister; dx after 77   Sleep apnea Neg Hx     SOCIAL HISTORY:  Social History   Socioeconomic History   Marital status: Single    Spouse name: Not on file   Number of children: Not on file   Years of education: Not on file   Highest education level: Not on file  Occupational History   Occupation: retired  Tobacco Use   Smoking status: Never   Smokeless tobacco: Never  Vaping Use   Vaping Use: Never used  Substance and Sexual Activity   Alcohol use: Yes    Alcohol/week: 1.0 standard drink of alcohol    Types: 1 Glasses of wine per week    Comment: 1 every 6 mts    Drug use: No   Sexual activity: Never  Other  Topics Concern   Not on file  Social History Narrative   Walks daily. Divorced. Education: [employer].      Drinks 4 cups of caffeine daily.   Social Determinants of Health   Financial Resource Strain: Low Risk  (11/16/2021)   Overall Financial Resource Strain (CARDIA)    Difficulty of Paying Living Expenses: Not hard at all  Food Insecurity: No Food Insecurity (11/16/2021)   Hunger Vital Sign    Worried About Running Out of Food in the Last Year:  Never true    Ran Out of Food in the Last Year: Never true  Transportation Needs: No Transportation Needs (11/16/2021)   PRAPARE - Hydrologist (Medical): No    Lack of Transportation (Non-Medical): No  Physical Activity: Not on file  Stress: Not on file  Social Connections: Not on file  Intimate Partner Violence: Not on file     OBSERVATIONS/OBJECTIVE:  BP 130/80 (BP Location: Right Arm, Patient Position: Sitting)   Pulse 71   Temp 97.9 F (36.6 C) (Temporal)   Resp 16   Ht '5\' 3"'$  (1.6 m)   Wt 173 lb 1.6 oz (78.5 kg)   SpO2 96%   BMI 30.66 kg/m  GENERAL: Patient is a well appearing female in no acute distress HEENT:  Sclerae anicteric.  Oropharynx clear and moist. No ulcerations or evidence of oropharyngeal candidiasis. Neck is supple.  NODES:  No cervical, supraclavicular, or axillary lymphadenopathy palpated.  BREAST EXAM: Breasts are status post lumpectomy and radiation bilaterally.  No sign of local recurrence. LUNGS:  Clear to auscultation bilaterally.  No wheezes or rhonchi. HEART:  Regular rate and rhythm. No murmur appreciated. ABDOMEN:  Soft, nontender.  Positive, normoactive bowel sounds. No organomegaly palpated. MSK:  No focal spinal tenderness to palpation. Full range of motion bilaterally in the upper extremities. EXTREMITIES:  Left behind the knee swelling SKIN:  Clear with no obvious rashes or skin changes. No nail dyscrasia. NEURO:  Nonfocal. Well oriented.  Appropriate  affect.   LABORATORY DATA:  None for this visit.  DIAGNOSTIC IMAGING:  None for this visit.     ASSESSMENT AND PLAN:  Ms.. Richard is a pleasant 82 y.o. female with bilateral breast cancer ER+/PR+/HER2-, diagnosed in January 2023, treated with lumpectomy, adjuvant radiation therapy, and anti-estrogen therapy with anastrozole beginning in April 2023.     1.  Bilateral estrogen positive breast cancer:  Tracey Richard is continuing to recover from definitive treatment for breast cancer. She is tolerating it very well except for some hair thinning No concern for recurrence on breast exam. Most recent mammogram neg for malignancy.  2. Bone health:  Given Tracey Richard's age/history of breast cancer and her current treatment regimen including anti-estrogen therapy with Anastrozole, she is at risk for bone demineralization.  Her last DEXA scan was 01/23/2020, which showed mild osteopenia with a T score of -1.2.   Most recent bone density with mild osteopenia, T score of -1.5, slightly worse than before. She will continue Ca/Vit D supplements.  3. Left lower extremity pain and swelling, ordered STAT Vasc US. This has been scheduled for tomorrow morning at 10 AM.  Total encounter time:40 minutes*in face-to-face visit time, chart review, lab review, care coordination, order entry, and documentation of the encounter time.  *Total Encounter Time as defined by the Centers for Medicare and Medicaid Services includes, in addition to the face-to-face time of a patient visit (documented in the note above) non-face-to-face time: obtaining and reviewing outside history, ordering and reviewing medications, tests or procedures, care coordination (communications with other health care professionals or caregivers) and documentation in the medical record.

## 2022-12-01 ENCOUNTER — Ambulatory Visit (HOSPITAL_COMMUNITY)
Admission: RE | Admit: 2022-12-01 | Discharge: 2022-12-01 | Disposition: A | Discharge status: Home / Self Care | Payer: Medicare Other | Source: Ambulatory Visit | Attending: Hematology and Oncology | Admitting: Hematology and Oncology

## 2022-12-01 DIAGNOSIS — M7989 Other specified soft tissue disorders: Secondary | ICD-10-CM

## 2022-12-01 DIAGNOSIS — C50211 Malignant neoplasm of upper-inner quadrant of right female breast: Secondary | ICD-10-CM | POA: Insufficient documentation

## 2022-12-01 DIAGNOSIS — Z170 Estrogen receptor positive status [ER+]: Secondary | ICD-10-CM

## 2022-12-07 ENCOUNTER — Encounter: Payer: Self-pay | Admitting: Adult Health

## 2022-12-07 ENCOUNTER — Ambulatory Visit: Payer: Medicare Other | Admitting: Adult Health

## 2022-12-07 VITALS — BP 107/58 | HR 99 | Ht 62.0 in | Wt 173.8 lb

## 2022-12-07 DIAGNOSIS — G4733 Obstructive sleep apnea (adult) (pediatric): Secondary | ICD-10-CM

## 2022-12-07 NOTE — Progress Notes (Signed)
PATIENT: Tracey Richard DOB: [birth year]  REASON FOR VISIT: follow up HISTORY FROM: patient PRIMARY NEUROLOGIST: Dr. Brett Fairy  Chief Complaint  Patient presents with   Follow-up    Pt in 18 with friend Pt here for CPAP f/u pt states when she drives she has to stop and take  a nap and start to drive again Pt states not able to drive more than 30 minutes without getting sleepy  Pt states she sleep all day and stays up all night      HISTORY OF PRESENT ILLNESS: Today 12/07/22:  Tracey Richard is a 82 y.o. female with a history of OSA on CPAP. Returns today for follow-up. Reports that Most of the time she doesn't go to bed until 3-5 AM she will sleep until around 3 PM. Reports that she prefers to stay up late at night.     12/07/21: Tracey Richard is a 82 year old female with a history of OSA on CPAP. Reports CPAP is working well. Patient had  Breast cancer surgery in February. Will meet will oncologist tomorrow.    HISTORY  12/06/20:   Tracey Richard is a 82 year old female with a history of obstructive sleep apnea on CPAP.  She returns today for follow-up.  She reports that the CPAP is still working well for her.  She denies any new issues.  Returns today for evaluation.   Compliance Report:   Compliance Usage 11/06/2020 - 12/05/2020 Usage days 30/30 days (100%) >= 4 hours 26 days (87%) Average usage (days used) 6 hours 24 minutes   AirSense 10 AutoSet For Her Serial number [id number] Mode AutoSet for Her Min Pressure 5 cmH2O Max Pressure 15 cmH2O EPR Fulltime EPR level 3   Therapy Pressure - cmH2O Median: 10.5 95th percentile: 13.2 Maximum: 13.9 Leaks - L/min Median: 4.0 95th percentile: 17.1 Maximum: 29.9 Events per hour AI: 2.2 HI: 0.4 AHI: 2.6 Apnea Index Central: 0.3 Obstructive: 1.5 Unknown: 0.3    REVIEW OF SYSTEMS: Out of a complete 14 system review of symptoms, the patient complains only of the following symptoms, and all other reviewed systems  are negative.   ESS 10  ALLERGIES: Allergies  Allergen Reactions   Elemental Sulfur Other (See Comments)    Childhood    Penicillins Other (See Comments)    Childhood    Oxycodone Hcl Itching   Tape Itching    HOME MEDICATIONS: Outpatient Medications Prior to Visit  Medication Sig Dispense Refill   acetaminophen (TYLENOL) 500 MG tablet Take 500-1,000 mg by mouth every 6 (six) hours as needed (pain.).     anastrozole (ARIMIDEX) 1 MG tablet Take 1 tablet (1 mg total) by mouth daily. 30 tablet 3   Ascorbic Acid (VITAMIN C) 1000 MG tablet Take 1,000 mg by mouth daily at 4 PM.     aspirin EC 81 MG tablet Take 81 mg by mouth daily.     Calcium Carb-Cholecalciferol (CALCIUM + D3) 600-800 MG-UNIT TABS Take 1 tablet by mouth daily after breakfast.     hydroxypropyl methylcellulose / hypromellose (ISOPTO TEARS / GONIOVISC) 2.5 % ophthalmic solution Place 1-2 drops into both eyes 3 (three) times daily as needed for dry eyes.     imipramine (TOFRANIL) 25 MG tablet TAKE THREE TABLETS BY MOUTH EVERY NIGHT AT BEDTIME 270 tablet 3   losartan (COZAAR) 50 MG tablet Take 50 mg by mouth at bedtime.     polyethylene glycol (MIRALAX / GLYCOLAX) 17 g packet Take 17 g by  mouth daily at 4 PM.     simvastatin (ZOCOR) 40 MG tablet TAKE 1 TABLET (40 MG TOTAL) BY MOUTH DAILY AT BEDTIME 90 tablet 3   Wheat Dextrin (BENEFIBER DRINK MIX PO) Take 1 Dose by mouth daily at 4 PM. 2 teaspoons     No facility-administered medications prior to visit.    PAST MEDICAL HISTORY: Past Medical History:  Diagnosis Date   Allergy    Cancer ([place])    Cataract    Chronic kidney disease    GERD (gastroesophageal reflux disease)    Hyperlipidemia    Hypertension    Pre-diabetes    Sleep apnea    Stroke ([place])    ruptured aneurysm    PAST SURGICAL HISTORY: Past Surgical History:  Procedure Laterality Date   [place]  2007   BREAST LUMPECTOMY WITH RADIOACTIVE SEED LOCALIZATION Bilateral  11/24/2021   Procedure: BILATERAL BREAST LUMPECTOMY WITH RADIOACTIVE SEED LOCALIZATION;  Surgeon: Stark Klein, MD;  Location: [facility];  Service: General;  Laterality: Bilateral;   BREAST SURGERY     DILATION AND CURETTAGE OF UTERUS     EYE SURGERY  1999   bilateral cataract extraction   moles  [place]    FAMILY HISTORY: Family History  Problem Relation Age of Onset   Vaginal cancer Mother        d. 53   Hypertension Mother    Prostate cancer Father        d. 62   Hypertension Father    Dementia Father    Diabetes Brother    Lung cancer Paternal Grandmother        dx after 37   Breast cancer Other        PGF's sister; dx after 28   Sleep apnea Neg Hx     SOCIAL HISTORY: Social History   Socioeconomic History   Marital status: Single    Spouse name: Not on file   Number of children: Not on file   Years of education: Not on file   Highest education level: Not on file  Occupational History   Occupation: retired  Tobacco Use   Smoking status: Never   Smokeless tobacco: Never  Vaping Use   Vaping Use: Never used  Substance and Sexual Activity   Alcohol use: Yes    Alcohol/week: 1.0 standard drink of alcohol    Types: 1 Glasses of wine per week    Comment: 1 every 6 mts    Drug use: No   Sexual activity: Never  Other Topics Concern   Not on file  Social History Narrative   Walks daily. Divorced. Education: [employer].      Drinks 4 cups of caffeine daily.   Social Determinants of Health   Financial Resource Strain: Low Risk  (11/16/2021)   Overall Financial Resource Strain (CARDIA)    Difficulty of Paying Living Expenses: Not hard at all  Food Insecurity: No Food Insecurity (11/16/2021)   Hunger Vital Sign    Worried About Running Out of Food in the Last Year: Never true    Ran Out of Food in the Last Year: Never true  Transportation Needs: No Transportation Needs (11/16/2021)   PRAPARE - Radiographer, therapeutic (Medical): No    Lack of Transportation (Non-Medical): No  Physical Activity: Not on file  Stress: Not on file  Social Connections: Not on  file  Intimate Partner Violence: Not on file      PHYSICAL EXAM  Vitals:   12/07/22 1423  BP: (!) 107/58  Pulse: 99  Weight: 173 lb 12.8 oz (78.8 kg)  Height: 5' 2"$  (1.575 m)   Body mass index is 31.79 kg/m.  Generalized: Well developed, in no acute distress  Chest: Lungs clear to auscultation bilaterally  Neurological examination  Mentation: Alert oriented to time, place, history taking. Follows all commands speech and language fluent Cranial nerve II-XII: Extraocular movements were full, visual field were full on confrontational test Head turning and shoulder shrug  were normal and symmetric. Gait and station: Gait is normal.    DIAGNOSTIC DATA (LABS, IMAGING, TESTING) - I reviewed patient records, labs, notes, testing and imaging myself where available.  Lab Results  Component Value Date   WBC 8.0 11/16/2021   HGB 11.7 (L) 11/16/2021   HCT 35.6 (L) 11/16/2021   MCV 88.6 11/16/2021   PLT 248 11/16/2021      Component Value Date/Time   NA 140 11/16/2021 0833   K 3.7 11/16/2021 0833   CL 106 11/16/2021 0833   CO2 26 11/16/2021 0833   GLUCOSE 132 (H) 11/16/2021 0833   BUN 21 11/16/2021 0833   CREATININE 1.05 (H) 11/16/2021 0833   CREATININE 0.98 (H) 07/03/2016 0835   CALCIUM 9.6 11/16/2021 0833   PROT 7.1 11/16/2021 0833   ALBUMIN 4.2 11/16/2021 0833   AST 18 11/16/2021 0833   ALT 17 11/16/2021 0833   ALKPHOS 51 11/16/2021 0833   BILITOT 0.6 11/16/2021 0833   GFRNONAA 54 (L) 11/16/2021 0833   GFRNONAA 57 (L) 07/03/2016 0835   GFRAA 55 (L) 05/25/2017 2240   GFRAA 66 07/03/2016 0835   Lab Results  Component Value Date   CHOL 155 07/03/2016   HDL 58 07/03/2016   LDLCALC 82 07/03/2016   TRIG 73 07/03/2016   CHOLHDL 2.7 07/03/2016   Lab Results  Component Value Date   HGBA1C 5.8 (H) 11/22/2021    No results found for: "VITAMINB12" Lab Results  Component Value Date   TSH 3.888 09/30/2013      ASSESSMENT AND PLAN 82 y.o. year old adult  has a past medical history of Allergy, Cancer ([place]), Cataract, Chronic kidney disease, GERD (gastroesophageal reflux disease), Hyperlipidemia, Hypertension, Pre-diabetes, Sleep apnea, and Stroke ([place]). here with:  OSA on CPAP  - CPAP compliance suboptimal - Good treatment of AHI  - Encourage patient to use CPAP nightly and > 4 hours each night - F/U in 1 year or sooner if needed    Ward Givens, MSN, NP-C 12/07/2022, 2:50 PM [facility] [address], [place], [place] [address] [phone]

## 2023-01-01 DIAGNOSIS — M25562 Pain in left knee: Secondary | ICD-10-CM | POA: Diagnosis not present

## 2023-01-04 ENCOUNTER — Inpatient Hospital Stay: Admission: RE | Admit: 2023-01-04 | Payer: Medicare Other | Source: Ambulatory Visit

## 2023-02-01 DIAGNOSIS — S56801A Unspecified injury of other muscles, fascia and tendons at forearm level, right arm, initial encounter: Secondary | ICD-10-CM | POA: Diagnosis not present

## 2023-02-01 DIAGNOSIS — M7701 Medial epicondylitis, right elbow: Secondary | ICD-10-CM | POA: Diagnosis not present

## 2023-02-01 DIAGNOSIS — R296 Repeated falls: Secondary | ICD-10-CM | POA: Diagnosis not present

## 2023-02-02 ENCOUNTER — Other Ambulatory Visit: Payer: Self-pay | Admitting: Adult Health

## 2023-02-15 ENCOUNTER — Other Ambulatory Visit: Payer: Self-pay | Admitting: *Deleted

## 2023-02-15 MED ORDER — ANASTROZOLE 1 MG PO TABS: 1.0000 mg | ORAL_TABLET | Freq: Every day | ORAL | 3 refills | Status: DC

## 2023-04-03 DIAGNOSIS — Z8673 Personal history of transient ischemic attack (TIA), and cerebral infarction without residual deficits: Secondary | ICD-10-CM | POA: Diagnosis not present

## 2023-04-03 DIAGNOSIS — M25562 Pain in left knee: Secondary | ICD-10-CM | POA: Diagnosis not present

## 2023-04-03 DIAGNOSIS — M85852 Other specified disorders of bone density and structure, left thigh: Secondary | ICD-10-CM | POA: Diagnosis not present

## 2023-04-03 DIAGNOSIS — R7303 Prediabetes: Secondary | ICD-10-CM | POA: Diagnosis not present

## 2023-04-03 DIAGNOSIS — Z87448 Personal history of other diseases of urinary system: Secondary | ICD-10-CM | POA: Diagnosis not present

## 2023-04-03 DIAGNOSIS — C50911 Malignant neoplasm of unspecified site of right female breast: Secondary | ICD-10-CM | POA: Diagnosis not present

## 2023-04-03 DIAGNOSIS — E559 Vitamin D deficiency, unspecified: Secondary | ICD-10-CM | POA: Diagnosis not present

## 2023-04-03 DIAGNOSIS — N1831 Chronic kidney disease, stage 3a: Secondary | ICD-10-CM | POA: Diagnosis not present

## 2023-04-03 DIAGNOSIS — G4730 Sleep apnea, unspecified: Secondary | ICD-10-CM | POA: Diagnosis not present

## 2023-04-03 DIAGNOSIS — E7800 Pure hypercholesterolemia, unspecified: Secondary | ICD-10-CM | POA: Diagnosis not present

## 2023-04-03 DIAGNOSIS — I10 Essential (primary) hypertension: Secondary | ICD-10-CM | POA: Diagnosis not present

## 2023-04-06 DIAGNOSIS — R296 Repeated falls: Secondary | ICD-10-CM | POA: Diagnosis not present

## 2023-04-06 DIAGNOSIS — R262 Difficulty in walking, not elsewhere classified: Secondary | ICD-10-CM | POA: Diagnosis not present

## 2023-04-06 DIAGNOSIS — M6281 Muscle weakness (generalized): Secondary | ICD-10-CM | POA: Diagnosis not present

## 2023-04-09 DIAGNOSIS — R262 Difficulty in walking, not elsewhere classified: Secondary | ICD-10-CM | POA: Diagnosis not present

## 2023-04-09 DIAGNOSIS — R296 Repeated falls: Secondary | ICD-10-CM | POA: Diagnosis not present

## 2023-04-09 DIAGNOSIS — M6281 Muscle weakness (generalized): Secondary | ICD-10-CM | POA: Diagnosis not present

## 2023-04-17 DIAGNOSIS — R262 Difficulty in walking, not elsewhere classified: Secondary | ICD-10-CM | POA: Diagnosis not present

## 2023-04-17 DIAGNOSIS — R296 Repeated falls: Secondary | ICD-10-CM | POA: Diagnosis not present

## 2023-04-17 DIAGNOSIS — M6281 Muscle weakness (generalized): Secondary | ICD-10-CM | POA: Diagnosis not present

## 2023-04-18 DIAGNOSIS — R262 Difficulty in walking, not elsewhere classified: Secondary | ICD-10-CM | POA: Diagnosis not present

## 2023-04-18 DIAGNOSIS — R296 Repeated falls: Secondary | ICD-10-CM | POA: Diagnosis not present

## 2023-04-18 DIAGNOSIS — M6281 Muscle weakness (generalized): Secondary | ICD-10-CM | POA: Diagnosis not present

## 2023-04-19 ENCOUNTER — Telehealth: Payer: Self-pay | Admitting: Hematology and Oncology

## 2023-04-19 NOTE — Telephone Encounter (Signed)
[facility] is aware of new appointment times/dates

## 2023-04-23 DIAGNOSIS — R296 Repeated falls: Secondary | ICD-10-CM | POA: Diagnosis not present

## 2023-04-23 DIAGNOSIS — R262 Difficulty in walking, not elsewhere classified: Secondary | ICD-10-CM | POA: Diagnosis not present

## 2023-04-23 DIAGNOSIS — M6281 Muscle weakness (generalized): Secondary | ICD-10-CM | POA: Diagnosis not present

## 2023-04-25 DIAGNOSIS — R296 Repeated falls: Secondary | ICD-10-CM | POA: Diagnosis not present

## 2023-04-25 DIAGNOSIS — M6281 Muscle weakness (generalized): Secondary | ICD-10-CM | POA: Diagnosis not present

## 2023-04-25 DIAGNOSIS — R262 Difficulty in walking, not elsewhere classified: Secondary | ICD-10-CM | POA: Diagnosis not present

## 2023-04-30 DIAGNOSIS — R296 Repeated falls: Secondary | ICD-10-CM | POA: Diagnosis not present

## 2023-04-30 DIAGNOSIS — R262 Difficulty in walking, not elsewhere classified: Secondary | ICD-10-CM | POA: Diagnosis not present

## 2023-04-30 DIAGNOSIS — M6281 Muscle weakness (generalized): Secondary | ICD-10-CM | POA: Diagnosis not present

## 2023-05-02 DIAGNOSIS — M6281 Muscle weakness (generalized): Secondary | ICD-10-CM | POA: Diagnosis not present

## 2023-05-02 DIAGNOSIS — R262 Difficulty in walking, not elsewhere classified: Secondary | ICD-10-CM | POA: Diagnosis not present

## 2023-05-02 DIAGNOSIS — R296 Repeated falls: Secondary | ICD-10-CM | POA: Diagnosis not present

## 2023-05-07 DIAGNOSIS — H35371 Puckering of macula, right eye: Secondary | ICD-10-CM | POA: Diagnosis not present

## 2023-05-07 DIAGNOSIS — H40013 Open angle with borderline findings, low risk, bilateral: Secondary | ICD-10-CM | POA: Diagnosis not present

## 2023-05-07 DIAGNOSIS — H43813 Vitreous degeneration, bilateral: Secondary | ICD-10-CM | POA: Diagnosis not present

## 2023-05-08 DIAGNOSIS — R296 Repeated falls: Secondary | ICD-10-CM | POA: Diagnosis not present

## 2023-05-08 DIAGNOSIS — M6281 Muscle weakness (generalized): Secondary | ICD-10-CM | POA: Diagnosis not present

## 2023-05-08 DIAGNOSIS — R262 Difficulty in walking, not elsewhere classified: Secondary | ICD-10-CM | POA: Diagnosis not present

## 2023-05-16 DIAGNOSIS — M6281 Muscle weakness (generalized): Secondary | ICD-10-CM | POA: Diagnosis not present

## 2023-05-16 DIAGNOSIS — R262 Difficulty in walking, not elsewhere classified: Secondary | ICD-10-CM | POA: Diagnosis not present

## 2023-05-16 DIAGNOSIS — R296 Repeated falls: Secondary | ICD-10-CM | POA: Diagnosis not present

## 2023-05-23 DIAGNOSIS — R296 Repeated falls: Secondary | ICD-10-CM | POA: Diagnosis not present

## 2023-05-23 DIAGNOSIS — R262 Difficulty in walking, not elsewhere classified: Secondary | ICD-10-CM | POA: Diagnosis not present

## 2023-05-23 DIAGNOSIS — M6281 Muscle weakness (generalized): Secondary | ICD-10-CM | POA: Diagnosis not present

## 2023-05-31 ENCOUNTER — Ambulatory Visit: Payer: Medicare Other | Admitting: Hematology and Oncology

## 2023-06-19 DIAGNOSIS — G4733 Obstructive sleep apnea (adult) (pediatric): Secondary | ICD-10-CM | POA: Diagnosis not present

## 2023-06-26 ENCOUNTER — Inpatient Hospital Stay: Payer: Medicare Other | Attending: Hematology and Oncology | Admitting: Hematology and Oncology

## 2023-06-26 VITALS — BP 135/77 | HR 88 | Temp 97.2°F | Resp 18 | Ht 62.0 in | Wt 166.8 lb

## 2023-06-26 DIAGNOSIS — Z79899 Other long term (current) drug therapy: Secondary | ICD-10-CM | POA: Diagnosis not present

## 2023-06-26 DIAGNOSIS — Z801 Family history of malignant neoplasm of trachea, bronchus and lung: Secondary | ICD-10-CM | POA: Insufficient documentation

## 2023-06-26 DIAGNOSIS — Z8042 Family history of malignant neoplasm of prostate: Secondary | ICD-10-CM | POA: Diagnosis not present

## 2023-06-26 DIAGNOSIS — Z808 Family history of malignant neoplasm of other organs or systems: Secondary | ICD-10-CM | POA: Insufficient documentation

## 2023-06-26 DIAGNOSIS — Z79811 Long term (current) use of aromatase inhibitors: Secondary | ICD-10-CM | POA: Insufficient documentation

## 2023-06-26 DIAGNOSIS — C50211 Malignant neoplasm of upper-inner quadrant of right female breast: Secondary | ICD-10-CM | POA: Insufficient documentation

## 2023-06-26 DIAGNOSIS — M8580 Other specified disorders of bone density and structure, unspecified site: Secondary | ICD-10-CM | POA: Diagnosis not present

## 2023-06-26 DIAGNOSIS — Z170 Estrogen receptor positive status [ER+]: Secondary | ICD-10-CM | POA: Diagnosis not present

## 2023-06-26 DIAGNOSIS — Z803 Family history of malignant neoplasm of breast: Secondary | ICD-10-CM | POA: Insufficient documentation

## 2023-06-26 DIAGNOSIS — Z923 Personal history of irradiation: Secondary | ICD-10-CM | POA: Insufficient documentation

## 2023-06-26 DIAGNOSIS — C50412 Malignant neoplasm of upper-outer quadrant of left female breast: Secondary | ICD-10-CM | POA: Insufficient documentation

## 2023-06-26 MED ORDER — ANASTROZOLE 1 MG PO TABS: 1.0000 mg | ORAL_TABLET | Freq: Every day | ORAL | 3 refills | Status: DC

## 2023-06-26 NOTE — Progress Notes (Signed)
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   12/01/2021 Genetic Testing   Negative hereditary cancer genetic testing: no pathogenic variants detected in [facility] +RNAinsight Panel.  The report date is 11/30/2021.   The CustomNext-Cancer+RNAinsight panel offered by [facility] includes sequencing and rearrangement analysis for the following 47 genes:  APC, ATM, AXIN2, BARD1, BMPR1A, BRCA1, BRCA2, BRIP1, CDH1, CDK4, CDKN2A, CHEK2, DICER1, EPCAM, GREM1, HOXB13, MEN1, MLH1, MSH2, MSH3, MSH6, MUTYH, NBN, NF1, NF2, NTHL1, PALB2, PMS2, POLD1, POLE, PTEN, RAD51C, RAD51D, RECQL, RET, SDHA, SDHAF2, SDHB, SDHC, SDHD, SMAD4, SMARCA4, STK11, TP53, TSC1, TSC2, and VHL.  RNA data is routinely analyzed for use in variant interpretation for all genes.   01/02/2022 - 01/27/2022 Radiation Therapy   Site Technique Total Dose (Gy) Dose per Fx (Gy) Completed Fx Beam Energies  Breast, Right: Breast_R 3D 42.56/42.56 2.66 16/16 6X  Breast, Right: Breast_R_Bst 3D 8/8 2 4/4 6X, 10X  Breast, Left: Breast_L 3D 42.56/42.56 2.66 16/16 10X  Breast, Left: Breast_L_Bst 3D 8/8 2 4/4 6X, 10X     01/2022 -  Anti-estrogen oral therapy   Anastrozole daily   Malignant neoplasm of  upper-outer quadrant of left female breast (HCC)  11/15/2021 Initial Diagnosis   Malignant neoplasm of upper-outer quadrant of left female breast (HCC)   11/16/2021 Cancer Staging   Staging form: Breast, AJCC 8th Edition - Clinical stage from 11/16/2021: cT1a, cN0, cM0, G2, ER+, PR+, HER2: Unknown - Signed by Tracey Moulds, MD on 11/16/2021 Stage prefix: Initial diagnosis Method of lymph node assessment: Clinical Histologic grading system: 3 grade system   11/24/2021 Surgery   Right breast lumpectomy showed invasive lobular carcinoma measuring 2.7 cm, mammary carcinoma in situ, all surgical margins negative for carcinoma Invasive carcinoma is 0.3 cm from superior margin prognostics on this tumor showed ER 100% positive, strong staining, PR 100% positive, strong staining, HER2 equivocal with IHC and negative with FISH ratio 1.31, Ki-67 of 5% from the original biopsy. 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   12/01/2021 Genetic Testing   Negative hereditary cancer genetic testing: no pathogenic variants detected in [facility]Can[facility] +RNAinsight Panel.  The report date is 11/30/2021.   The CustomNext-Cancer+RNAinsight panel offered by [facility] includes sequencing and rearrangement analysis for the following 47 genes:  APC, ATM, AXIN2, BARD1, BMPR1A,  BRCA1, BRCA2, BRIP1, CDH1, CDK4, CDKN2A, CHEK2, DICER1, EPCAM,  GREM1, HOXB13, MEN1, MLH1, MSH2, MSH3, MSH6, MUTYH, NBN, NF1, NF2, NTHL1, PALB2, PMS2, POLD1, POLE, PTEN, RAD51C, RAD51D, RECQL, RET, SDHA, SDHAF2, SDHB, SDHC, SDHD, SMAD4, SMARCA4, STK11, TP53, TSC1, TSC2, and VHL.  RNA data is routinely analyzed for use in variant interpretation for all genes.   01/02/2022 - 01/27/2022 Radiation Therapy   Site Technique Total Dose (Gy) Dose per Fx (Gy) Completed Fx Beam Energies  Breast, Right: Breast_R 3D 42.56/42.56 2.66 16/16 6X  Breast, Right: Breast_R_Bst 3D 8/8 2 4/4 6X, 10X  Breast, Left: Breast_L 3D 42.56/42.56 2.66 16/16 10X  Breast, Left: Breast_L_Bst 3D 8/8 2 4/4 6X, 10X     01/2022 -  Anti-estrogen oral therapy   Anastrozole daily   05/22/2022 Cancer Staging   Staging form: Breast, AJCC 8th Edition - Pathologic: Stage IA (pT1c, pN0, cM0, G1, ER+, PR+, HER2-) - Signed by Tracey Socks, NP on 05/22/2022 Histologic grading system: 3 grade system     INTERVAL HISTORY:   Overall, Tracey Richard reports feeling quite well.  She is taking anastrozole daily and notes some mild hair thinning and some hot flashes but both of these are manageable. She didn't have any major issues today.  Rest of the pertinent 10 point ROS reviewed and neg.  REVIEW OF SYSTEMS:  Review of Systems  Constitutional:  Negative for appetite change, chills, fatigue, fever and unexpected weight change.  HENT:   Negative for hearing loss, lump/mass and trouble swallowing.   Eyes:  Negative for eye problems and icterus.  Respiratory:  Negative for chest tightness, cough and shortness of breath.   Cardiovascular:  Negative for chest pain, leg swelling and palpitations.  Gastrointestinal:  Negative for abdominal distention, abdominal pain, constipation, diarrhea, nausea and vomiting.  Endocrine: Positive for hot flashes.  Genitourinary:  Negative for difficulty urinating.   Musculoskeletal:  Negative for arthralgias.  Skin:  Negative for itching and rash.   Neurological:  Negative for dizziness, extremity weakness, headaches and numbness.  Hematological:  Negative for adenopathy. Does not bruise/bleed easily.  Psychiatric/Behavioral:  Negative for depression. The patient is not nervous/anxious.   Breast: Denies any new nodularity, masses, tenderness, nipple changes, or nipple discharge.   ONCOLOGY TREATMENT TEAM:  1. Surgeon:  Dr. Donell Beers at [facility] 2. Medical Oncologist: Dr. Al Pimple  3. Radiation Oncologist: Dr. Mitzi Hansen    PAST MEDICAL/SURGICAL HISTORY:  Past Medical History:  Diagnosis Date   Allergy    Cancer [facility])    Cataract    Chronic kidney disease    GERD (gastroesophageal reflux disease)    Hyperlipidemia    Hypertension    Pre-diabetes    Sleep apnea    Stroke [facility])    ruptured aneurysm   Past Surgical History:  Procedure Laterality Date   APPENDECTOMY  1963   BACK SURGERY  2007   BREAST LUMPECTOMY WITH RADIOACTIVE SEED LOCALIZATION Bilateral 11/24/2021   Procedure: BILATERAL BREAST LUMPECTOMY WITH RADIOACTIVE SEED LOCALIZATION;  Surgeon: Almond Lint, MD;  Location: [facility] OR;  Service: General;  Laterality: Bilateral;   BREAST SURGERY     DILATION AND CURETTAGE OF UTERUS     EYE SURGERY  1999   bilateral cataract extraction   moles  1965   SPINE SURGERY     TONSILLECTOMY  1959     ALLERGIES:  Allergies  Allergen Reactions   Elemental Sulfur Other (See Comments)    Childhood    Penicillins Other (See Comments)  Childhood    Oxycodone Hcl Itching   Tape Itching     CURRENT MEDICATIONS:  Outpatient Encounter Medications as of 06/26/2023  Medication Sig   acetaminophen (TYLENOL) 500 MG tablet Take 500-1,000 mg by mouth every 6 (six) hours as needed (pain.).   anastrozole (ARIMIDEX) 1 MG tablet Take 1 tablet (1 mg total) by mouth daily.   Ascorbic Acid (VITAMIN C) 1000 MG tablet Take 1,000 mg by mouth daily at 4 PM.   aspirin EC 81 MG tablet Take 81 mg by mouth daily.   Calcium  Carb-Cholecalciferol (CALCIUM + D3) 600-800 MG-UNIT TABS Take 1 tablet by mouth daily after breakfast.   hydroxypropyl methylcellulose / hypromellose (ISOPTO TEARS / GONIOVISC) 2.5 % ophthalmic solution Place 1-2 drops into both eyes 3 (three) times daily as needed for dry eyes.   imipramine (TOFRANIL) 25 MG tablet TAKE 3 TABLETS BY MOUTH EVERY NIGHT AT BEDTIME   losartan (COZAAR) 50 MG tablet Take 50 mg by mouth at bedtime.   polyethylene glycol (MIRALAX / GLYCOLAX) 17 g packet Take 17 g by mouth daily at 4 PM.   simvastatin (ZOCOR) 40 MG tablet TAKE 1 TABLET (40 MG TOTAL) BY MOUTH DAILY AT BEDTIME   Wheat Dextrin (BENEFIBER DRINK MIX PO) Take 1 Dose by mouth daily at 4 PM. 2 teaspoons   No facility-administered encounter medications on file as of 06/26/2023.     ONCOLOGIC FAMILY HISTORY:  Family History  Problem Relation Age of Onset   Vaginal cancer Mother        d. 52   Hypertension Mother    Prostate cancer Father        d. 46   Hypertension Father    Dementia Father    Diabetes Brother    Lung cancer Paternal Grandmother        dx after 25   Breast cancer Other        PGF's sister; dx after 50   Sleep apnea Neg Hx     SOCIAL HISTORY:  Social History   Socioeconomic History   Marital status: Single    Spouse name: Not on file   Number of children: Not on file   Years of education: Not on file   Highest education level: Not on file  Occupational History   Occupation: retired  Tobacco Use   Smoking status: Never   Smokeless tobacco: Never  Vaping Use   Vaping status: Never Used  Substance and Sexual Activity   Alcohol use: Yes    Alcohol/week: 1.0 standard drink of alcohol    Types: 1 Glasses of wine per week    Comment: 1 every 6 mts    Drug use: No   Sexual activity: Never  Other Topics Concern   Not on file  Social History Narrative   Walks daily. Divorced. Education: [employer].      Drinks 4 cups of caffeine daily.   Social Determinants of Health    Financial Resource Strain: Low Risk  (11/16/2021)   Overall Financial Resource Strain (CARDIA)    Difficulty of Paying Living Expenses: Not hard at all  Food Insecurity: No Food Insecurity (11/16/2021)   Hunger Vital Sign    Worried About Running Out of Food in the Last Year: Never true    Ran Out of Food in the Last Year: Never true  Transportation Needs: No Transportation Needs (11/16/2021)   PRAPARE - Transportation    Lack of Transportation (Medical): No    Lack of  Transportation (Non-Medical): No  Physical Activity: Not on file  Stress: Not on file  Social Connections: Not on file  Intimate Partner Violence: Not on file     OBSERVATIONS/OBJECTIVE:  BP 135/77 (BP Location: Left Arm, Patient Position: Sitting)   Pulse 88   Temp (!) 97.2 F (36.2 C) (Temporal)   Resp 18   Ht 5\' 2"  (1.575 m)   Wt 166 lb 12.8 oz (75.7 kg)   SpO2 95%   BMI 30.51 kg/m  GENERAL: Patient is a well appearing female in no acute distress HEENT:  Sclerae anicteric.  Oropharynx clear and moist. No ulcerations or evidence of oropharyngeal candidiasis. Neck is supple.  NODES:  No cervical, supraclavicular, or axillary lymphadenopathy palpated.  BREAST EXAM: Breasts are status post lumpectomy and radiation bilaterally.  No sign of local recurrence. LUNGS:  Clear to auscultation bilaterally.  No wheezes or rhonchi. HEART:  Regular rate and rhythm. No murmur appreciated. ABDOMEN:  Soft, nontender.  Positive, normoactive bowel sounds. No organomegaly palpated. MSK:  No focal spinal tenderness to palpation. Full range of motion bilaterally in the upper extremities. EXTREMITIES:  Left behind the knee swelling SKIN:  Clear with no obvious rashes or skin changes. No nail dyscrasia. NEURO:  Nonfocal. Well oriented.  Appropriate affect.   LABORATORY DATA:  None for this visit.  DIAGNOSTIC IMAGING:  None for this visit.     ASSESSMENT AND PLAN:   Ms.. Richard is a pleasant 82 y.o. female with  bilateral breast cancer ER+/PR+/HER2-, diagnosed in January 2023, treated with lumpectomy, adjuvant radiation therapy, and anti-estrogen therapy with anastrozole beginning in April 2023.    1.  Bilateral estrogen positive breast cancer:  Tracey Richard is continuing to recover from definitive treatment for breast cancer. She is tolerating it very well except for some hair thinning No concern for recurrence on breast exam. I refilled the anastrozole today  2. Bone health:  Given Tracey Richard's age/history of breast cancer and her current treatment regimen including anti-estrogen therapy with Anastrozole, she is at risk for bone demineralization.  Her last DEXA scan was 01/23/2020, which showed mild osteopenia with a T score of -1.2.  Encouraged Ca/Vit D supplementation and weight bearing exercises.  We will request mammogram and bone density from [facility].  Total encounter time:20 minutes*in face-to-face visit time, chart review, lab review, care coordination, order entry, and documentation of the encounter time.  *Total Encounter Time as defined by the Centers for Medicare and Medicaid Services includes, in addition to the face-to-face time of a patient visit (documented in the note above) non-face-to-face time: obtaining and reviewing outside history, ordering and reviewing medications, tests or procedures, care coordination (communications with other health care professionals or caregivers) and documentation in the medical record.

## 2023-06-27 ENCOUNTER — Telehealth: Payer: Self-pay | Admitting: *Deleted

## 2023-06-27 NOTE — Telephone Encounter (Signed)
This RN called Shanda Bumps at [facility] - obtained identified VM - message left requesting most recent mammo and bone density to be faxed to this pod.

## 2023-06-28 ENCOUNTER — Telehealth: Payer: Self-pay | Admitting: Hematology and Oncology

## 2023-06-28 NOTE — Telephone Encounter (Signed)
Patient is aware of scheduled appointment times/dates

## 2023-07-25 DIAGNOSIS — M25562 Pain in left knee: Secondary | ICD-10-CM | POA: Diagnosis not present

## 2023-08-21 DIAGNOSIS — M25562 Pain in left knee: Secondary | ICD-10-CM | POA: Diagnosis not present

## 2023-09-08 DIAGNOSIS — S61411A Laceration without foreign body of right hand, initial encounter: Secondary | ICD-10-CM | POA: Diagnosis not present

## 2023-09-08 DIAGNOSIS — Z23 Encounter for immunization: Secondary | ICD-10-CM | POA: Diagnosis not present

## 2023-09-08 DIAGNOSIS — W19XXXA Unspecified fall, initial encounter: Secondary | ICD-10-CM | POA: Diagnosis not present

## 2023-09-09 DIAGNOSIS — M25562 Pain in left knee: Secondary | ICD-10-CM | POA: Diagnosis not present

## 2023-09-12 DIAGNOSIS — S61411D Laceration without foreign body of right hand, subsequent encounter: Secondary | ICD-10-CM | POA: Diagnosis not present

## 2023-09-12 DIAGNOSIS — Z5189 Encounter for other specified aftercare: Secondary | ICD-10-CM | POA: Diagnosis not present

## 2023-09-16 DIAGNOSIS — Z4802 Encounter for removal of sutures: Secondary | ICD-10-CM | POA: Diagnosis not present

## 2023-09-17 DIAGNOSIS — M25562 Pain in left knee: Secondary | ICD-10-CM | POA: Diagnosis not present

## 2023-10-19 ENCOUNTER — Telehealth: Payer: Self-pay | Admitting: *Deleted

## 2023-10-19 ENCOUNTER — Other Ambulatory Visit: Payer: Self-pay | Admitting: *Deleted

## 2023-10-19 ENCOUNTER — Encounter: Payer: Self-pay | Admitting: *Deleted

## 2023-10-19 DIAGNOSIS — D229 Melanocytic nevi, unspecified: Secondary | ICD-10-CM

## 2023-10-19 NOTE — Telephone Encounter (Signed)
Pt reports a mole on her upper buttock that has changed in appearance as well as she scratched it with noted excessive bleeding.  She has seen Dr Reche Dixon dermatology previously.  This RN called to his office and was informed to send an Urgent referral request for pt to be seen within 2-4 weeks.  Fax number given as [phone].

## 2023-10-26 DIAGNOSIS — J069 Acute upper respiratory infection, unspecified: Secondary | ICD-10-CM | POA: Diagnosis not present

## 2023-11-01 DIAGNOSIS — R7303 Prediabetes: Secondary | ICD-10-CM | POA: Diagnosis not present

## 2023-11-01 DIAGNOSIS — E7800 Pure hypercholesterolemia, unspecified: Secondary | ICD-10-CM | POA: Diagnosis not present

## 2023-11-01 DIAGNOSIS — I10 Essential (primary) hypertension: Secondary | ICD-10-CM | POA: Diagnosis not present

## 2023-11-01 DIAGNOSIS — E559 Vitamin D deficiency, unspecified: Secondary | ICD-10-CM | POA: Diagnosis not present

## 2023-11-01 DIAGNOSIS — J019 Acute sinusitis, unspecified: Secondary | ICD-10-CM | POA: Diagnosis not present

## 2023-11-01 DIAGNOSIS — Z79811 Long term (current) use of aromatase inhibitors: Secondary | ICD-10-CM | POA: Diagnosis not present

## 2023-11-01 DIAGNOSIS — Z1211 Encounter for screening for malignant neoplasm of colon: Secondary | ICD-10-CM | POA: Diagnosis not present

## 2023-11-01 DIAGNOSIS — M85852 Other specified disorders of bone density and structure, left thigh: Secondary | ICD-10-CM | POA: Diagnosis not present

## 2023-11-01 DIAGNOSIS — Z8673 Personal history of transient ischemic attack (TIA), and cerebral infarction without residual deficits: Secondary | ICD-10-CM | POA: Diagnosis not present

## 2023-11-01 DIAGNOSIS — Z0000 Encounter for general adult medical examination without abnormal findings: Secondary | ICD-10-CM | POA: Diagnosis not present

## 2023-11-01 DIAGNOSIS — C50911 Malignant neoplasm of unspecified site of right female breast: Secondary | ICD-10-CM | POA: Diagnosis not present

## 2023-11-01 DIAGNOSIS — N1831 Chronic kidney disease, stage 3a: Secondary | ICD-10-CM | POA: Diagnosis not present

## 2023-11-09 DIAGNOSIS — Z853 Personal history of malignant neoplasm of breast: Secondary | ICD-10-CM | POA: Diagnosis not present

## 2023-11-16 DIAGNOSIS — J0100 Acute maxillary sinusitis, unspecified: Secondary | ICD-10-CM | POA: Diagnosis not present

## 2023-12-03 DIAGNOSIS — M542 Cervicalgia: Secondary | ICD-10-CM | POA: Diagnosis not present

## 2023-12-03 DIAGNOSIS — M25562 Pain in left knee: Secondary | ICD-10-CM | POA: Diagnosis not present

## 2023-12-03 DIAGNOSIS — M545 Low back pain, unspecified: Secondary | ICD-10-CM | POA: Diagnosis not present

## 2023-12-03 DIAGNOSIS — M9903 Segmental and somatic dysfunction of lumbar region: Secondary | ICD-10-CM | POA: Diagnosis not present

## 2023-12-04 ENCOUNTER — Encounter: Payer: Self-pay | Admitting: Family Medicine

## 2023-12-05 ENCOUNTER — Other Ambulatory Visit: Payer: Self-pay | Admitting: *Deleted

## 2023-12-05 ENCOUNTER — Telehealth: Payer: Self-pay | Admitting: *Deleted

## 2023-12-05 NOTE — Telephone Encounter (Signed)
Received faxed lab report from Dr Azucena Cecil showing continued elevated calcium level despite pt holding calcium supplement x 1 month.  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

## 2023-12-06 ENCOUNTER — Other Ambulatory Visit: Payer: Self-pay

## 2023-12-06 ENCOUNTER — Inpatient Hospital Stay: Payer: Medicare Other | Attending: Hematology and Oncology

## 2023-12-06 DIAGNOSIS — R251 Tremor, unspecified: Secondary | ICD-10-CM | POA: Diagnosis not present

## 2023-12-06 DIAGNOSIS — Z79899 Other long term (current) drug therapy: Secondary | ICD-10-CM | POA: Insufficient documentation

## 2023-12-06 DIAGNOSIS — M545 Low back pain, unspecified: Secondary | ICD-10-CM | POA: Diagnosis not present

## 2023-12-06 DIAGNOSIS — Z1732 Human epidermal growth factor receptor 2 negative status: Secondary | ICD-10-CM | POA: Insufficient documentation

## 2023-12-06 DIAGNOSIS — Z170 Estrogen receptor positive status [ER+]: Secondary | ICD-10-CM | POA: Diagnosis not present

## 2023-12-06 DIAGNOSIS — M9903 Segmental and somatic dysfunction of lumbar region: Secondary | ICD-10-CM | POA: Diagnosis not present

## 2023-12-06 DIAGNOSIS — R296 Repeated falls: Secondary | ICD-10-CM | POA: Insufficient documentation

## 2023-12-06 DIAGNOSIS — M542 Cervicalgia: Secondary | ICD-10-CM | POA: Diagnosis not present

## 2023-12-06 DIAGNOSIS — C50211 Malignant neoplasm of upper-inner quadrant of right female breast: Secondary | ICD-10-CM | POA: Diagnosis not present

## 2023-12-06 DIAGNOSIS — Z1721 Progesterone receptor positive status: Secondary | ICD-10-CM | POA: Diagnosis not present

## 2023-12-06 DIAGNOSIS — Z923 Personal history of irradiation: Secondary | ICD-10-CM | POA: Diagnosis not present

## 2023-12-06 DIAGNOSIS — C50412 Malignant neoplasm of upper-outer quadrant of left female breast: Secondary | ICD-10-CM | POA: Diagnosis not present

## 2023-12-06 LAB — CMP (CANCER CENTER ONLY)
ALT: 9 U/L (ref 0–44)
AST: 14 U/L — ABNORMAL LOW (ref 15–41)
Albumin: 4 g/dL (ref 3.5–5.0)
Alkaline Phosphatase: 59 U/L (ref 38–126)
Anion gap: 3 — ABNORMAL LOW (ref 5–15)
BUN: 18 mg/dL (ref 8–23)
CO2: 31 mmol/L (ref 22–32)
Calcium: 9.5 mg/dL (ref 8.9–10.3)
Chloride: 108 mmol/L (ref 98–111)
Creatinine: 0.9 mg/dL (ref 0.44–1.00)
GFR, Estimated: >60 mL/min (ref >60)
Glucose, Bld: 105 mg/dL — ABNORMAL HIGH (ref 70–99)
Potassium: 4.2 mmol/L (ref 3.5–5.1)
Sodium: 142 mmol/L (ref 135–145)
Total Bilirubin: 0.6 mg/dL (ref 0.0–1.2)
Total Protein: 6.5 g/dL (ref 6.5–8.1)

## 2023-12-06 LAB — VITAMIN D 25 HYDROXY (VIT D DEFICIENCY, FRACTURES): Vit D, 25-Hydroxy: 44.21 ng/mL (ref 30–100)

## 2023-12-07 NOTE — Telephone Encounter (Signed)
No entry

## 2023-12-10 ENCOUNTER — Telehealth: Payer: Self-pay

## 2023-12-10 DIAGNOSIS — M542 Cervicalgia: Secondary | ICD-10-CM | POA: Diagnosis not present

## 2023-12-10 DIAGNOSIS — M9903 Segmental and somatic dysfunction of lumbar region: Secondary | ICD-10-CM | POA: Diagnosis not present

## 2023-12-10 DIAGNOSIS — M545 Low back pain, unspecified: Secondary | ICD-10-CM | POA: Diagnosis not present

## 2023-12-10 NOTE — Telephone Encounter (Signed)
 Spoke with patient and confirmed appointment on 2/18 patient confirmed.Marland Kitchen

## 2023-12-11 ENCOUNTER — Inpatient Hospital Stay: Payer: Medicare Other | Admitting: Hematology and Oncology

## 2023-12-11 ENCOUNTER — Telehealth: Payer: Self-pay | Admitting: Adult Health

## 2023-12-11 ENCOUNTER — Other Ambulatory Visit: Payer: Self-pay

## 2023-12-11 ENCOUNTER — Encounter: Payer: Self-pay | Admitting: Hematology and Oncology

## 2023-12-11 VITALS — BP 132/76 | HR 86 | Temp 98.3°F | Resp 16 | Wt 162.7 lb

## 2023-12-11 DIAGNOSIS — Z923 Personal history of irradiation: Secondary | ICD-10-CM | POA: Diagnosis not present

## 2023-12-11 DIAGNOSIS — C50412 Malignant neoplasm of upper-outer quadrant of left female breast: Secondary | ICD-10-CM | POA: Diagnosis not present

## 2023-12-11 DIAGNOSIS — Z79899 Other long term (current) drug therapy: Secondary | ICD-10-CM | POA: Diagnosis not present

## 2023-12-11 DIAGNOSIS — Z170 Estrogen receptor positive status [ER+]: Secondary | ICD-10-CM | POA: Diagnosis not present

## 2023-12-11 DIAGNOSIS — Z1721 Progesterone receptor positive status: Secondary | ICD-10-CM | POA: Diagnosis not present

## 2023-12-11 DIAGNOSIS — Z1732 Human epidermal growth factor receptor 2 negative status: Secondary | ICD-10-CM | POA: Diagnosis not present

## 2023-12-11 DIAGNOSIS — R296 Repeated falls: Secondary | ICD-10-CM | POA: Diagnosis not present

## 2023-12-11 DIAGNOSIS — C50211 Malignant neoplasm of upper-inner quadrant of right female breast: Secondary | ICD-10-CM | POA: Diagnosis not present

## 2023-12-11 DIAGNOSIS — R251 Tremor, unspecified: Secondary | ICD-10-CM | POA: Diagnosis not present

## 2023-12-11 NOTE — Progress Notes (Signed)
 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   12/01/2021 Genetic Testing   Negative hereditary cancer genetic testing: no pathogenic variants detected in [facility]Can[facility] +RNAinsight Panel.  The report date is 11/30/2021.   The CustomNext-Cancer+RNAinsight panel offered by [facility] includes sequencing and rearrangement analysis for the following 47 genes:  APC, ATM, AXIN2, BARD1, BMPR1A, BRCA1, BRCA2, BRIP1, CDH1, CDK4, CDKN2A, CHEK2, DICER1, EPCAM, GREM1, HOXB13, MEN1, MLH1, MSH2, MSH3, MSH6, MUTYH, NBN, NF1, NF2, NTHL1, PALB2, PMS2, POLD1, POLE, PTEN, RAD51C, RAD51D, RECQL, RET, SDHA, SDHAF2, SDHB, SDHC, SDHD, SMAD4, SMARCA4, STK11, TP53, TSC1, TSC2, and VHL.  RNA data is routinely analyzed for use in variant interpretation for all genes.   01/02/2022 - 01/27/2022 Radiation Therapy   Site Technique Total Dose (Gy) Dose per Fx (Gy) Completed Fx Beam Energies  Breast, Right: Breast_R 3D 42.56/42.56 2.66 16/16 6X  Breast, Right: Breast_R_Bst 3D 8/8 2 4/4 6X, 10X  Breast, Left: Breast_L 3D 42.56/42.56 2.66 16/16 10X  Breast, Left: Breast_L_Bst 3D 8/8 2 4/4 6X, 10X     01/2022 -  Anti-estrogen oral therapy   Anastrozole daily   Malignant neoplasm of  upper-outer quadrant of left female breast (HCC)  11/15/2021 Initial Diagnosis   Malignant neoplasm of upper-outer quadrant of left female breast (HCC)   11/16/2021 Cancer Staging   Staging form: Breast, AJCC 8th Edition - Clinical stage from 11/16/2021: cT1a, cN0, cM0, G2, ER+, PR+, HER2: Unknown - Signed by Rachel Moulds, MD on 11/16/2021 Stage prefix: Initial diagnosis Method of lymph node assessment: Clinical Histologic grading system: 3 grade system   11/24/2021 Surgery   Right breast lumpectomy showed invasive lobular carcinoma measuring 2.7 cm, mammary carcinoma in situ, all surgical margins negative for carcinoma Invasive carcinoma is 0.3 cm from superior margin prognostics on this tumor showed ER 100% positive, strong staining, PR 100% positive, strong staining, HER2 equivocal with IHC and negative with FISH ratio 1.31, Ki-67 of 5% from the original biopsy. 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   12/01/2021 Genetic Testing   Negative hereditary cancer genetic testing: no pathogenic variants detected in [facility]Cancer +RNAinsight Panel.  The report date is 11/30/2021.   The CustomNext-Cancer+RNAinsight panel offered by [facility] includes sequencing and rearrangement analysis for the following 47 genes:  APC, ATM, AXIN2, BARD1, BMPR1A,  BRCA1, BRCA2, BRIP1, CDH1, CDK4, CDKN2A, CHEK2, DICER1, EPCAM,  GREM1, HOXB13, MEN1, MLH1, MSH2, MSH3, MSH6, MUTYH, NBN, NF1, NF2, NTHL1, PALB2, PMS2, POLD1, POLE, PTEN, RAD51C, RAD51D, RECQL, RET, SDHA, SDHAF2, SDHB, SDHC, SDHD, SMAD4, SMARCA4, STK11, TP53, TSC1, TSC2, and VHL.  RNA data is routinely analyzed for use in variant interpretation for all genes.   01/02/2022 - 01/27/2022 Radiation Therapy   Site Technique Total Dose (Gy) Dose per Fx (Gy) Completed Fx Beam Energies  Breast, Right: Breast_R 3D 42.56/42.56 2.66 16/16 6X  Breast, Right: Breast_R_Bst 3D 8/8 2 4/4 6X, 10X  Breast, Left: Breast_L 3D 42.56/42.56 2.66 16/16 10X  Breast, Left: Breast_L_Bst 3D 8/8 2 4/4 6X, 10X     01/2022 -  Anti-estrogen oral therapy   Anastrozole daily   05/22/2022 Cancer Staging   Staging form: Breast, AJCC 8th Edition - Pathologic: Stage IA (pT1c, pN0, cM0, G1, ER+, PR+, HER2-) - Signed by Loa Socks, NP on 05/22/2022 Histologic grading system: 3 grade system     INTERVAL HISTORY:   Discussed the use of AI scribe software for clinical note transcription with the patient, who gave verbal consent to proceed.  History of Present Illness    Tracey Richard is an 83 year old female with breast cancer who presents with concerns about medication management and recent falls.  She is not currently taking anastrozole for her breast cancer due to previously elevated calcium levels, which were around 11.5 mg/dL. Her calcium levels have since normalized to 9.5 mg/dL. She has also discontinued vitamin D, which was combined with calcium in a single tablet.  She experiences frequent falls, primarily due to stubbing her right toe. She has not fallen since attending physical therapy. She recalls a fall in a handicapped parking space requiring four stitches on her finger and another fall at the beach. She has tried using a walker but found it too wide and does not wish to use one regularly.  She has noticed a new tremor, which is not constant. Her husband has also  observed it. She is unsure if it worsens with activity. She drinks about three cups of coffee a day and has not noticed any correlation between her coffee intake and the tremor.  She experiences occasional dizziness, sometimes when standing or moving around, but it is not frequent. She describes it as 'dizzy headed' and notes that it is beneficial to have something to hold onto when it occurs.  She reports a recent episode of a severe cough, which she attributes to sinus issues. This required three doctor visits and multiple medications, including prednisone for five days, a Z-Pak, and a ten-day course of loxicillin, before resolving.  Rest of the pertinent 10 point ROS reviewed and neg.   ONCOLOGY TREATMENT TEAM:  1. Surgeon:  Dr. Donell Beers at [facility] 2. Medical Oncologist: Dr. Al Pimple  3. Radiation Oncologist: Dr. Mitzi Hansen    PAST MEDICAL/SURGICAL HISTORY:  Past Medical History:  Diagnosis Date   Allergy    Cancer [facility])    Cataract    Chronic kidney disease    GERD (gastroesophageal reflux disease)    Hyperlipidemia    Hypertension    Pre-diabetes    Sleep apnea    Stroke [facility])    ruptured aneurysm   Past Surgical History:  Procedure Laterality Date   APPENDECTOMY  1963   BACK SURGERY  2007   BREAST LUMPECTOMY WITH RADIOACTIVE SEED LOCALIZATION Bilateral 11/24/2021   Procedure: BILATERAL BREAST LUMPECTOMY WITH RADIOACTIVE SEED  LOCALIZATION;  Surgeon: Almond Lint, MD;  Location: [facility] OR;  Service: General;  Laterality: Bilateral;   BREAST SURGERY     DILATION AND CURETTAGE OF UTERUS     EYE SURGERY  1999   bilateral cataract extraction   moles  1965   SPINE SURGERY     TONSILLECTOMY  1959     ALLERGIES:  Allergies  Allergen Reactions   Elemental Sulfur Other (See Comments)    Childhood    Penicillins Other (See Comments)    Childhood    Oxycodone Hcl Itching   Tape Itching     CURRENT MEDICATIONS:  Outpatient Encounter Medications as of 12/11/2023   Medication Sig   acetaminophen (TYLENOL) 500 MG tablet Take 500-1,000 mg by mouth every 6 (six) hours as needed (pain.).   anastrozole (ARIMIDEX) 1 MG tablet Take 1 tablet (1 mg total) by mouth daily.   Ascorbic Acid (VITAMIN C) 1000 MG tablet Take 1,000 mg by mouth daily at 4 PM.   aspirin EC 81 MG tablet Take 81 mg by mouth daily.   Calcium Carb-Cholecalciferol (CALCIUM + D3) 600-800 MG-UNIT TABS Take 1 tablet by mouth daily after breakfast.   hydroxypropyl methylcellulose / hypromellose (ISOPTO TEARS / GONIOVISC) 2.5 % ophthalmic solution Place 1-2 drops into both eyes 3 (three) times daily as needed for dry eyes.   imipramine (TOFRANIL) 25 MG tablet TAKE 3 TABLETS BY MOUTH EVERY NIGHT AT BEDTIME   losartan (COZAAR) 50 MG tablet Take 50 mg by mouth at bedtime.   polyethylene glycol (MIRALAX / GLYCOLAX) 17 g packet Take 17 g by mouth daily at 4 PM.   simvastatin (ZOCOR) 40 MG tablet TAKE 1 TABLET (40 MG TOTAL) BY MOUTH DAILY AT BEDTIME   Wheat Dextrin (BENEFIBER DRINK MIX PO) Take 1 Dose by mouth daily at 4 PM. 2 teaspoons   No facility-administered encounter medications on file as of 12/11/2023.     ONCOLOGIC FAMILY HISTORY:  Family History  Problem Relation Age of Onset   Vaginal cancer Mother        d. 78   Hypertension Mother    Prostate cancer Father        d. 66   Hypertension Father    Dementia Father    Diabetes Brother    Lung cancer Paternal Grandmother        dx after 37   Breast cancer Other        PGF's sister; dx after 50   Sleep apnea Neg Hx     SOCIAL HISTORY:  Social History   Socioeconomic History   Marital status: Single    Spouse name: Not on file   Number of children: Not on file   Years of education: Not on file   Highest education level: Not on file  Occupational History   Occupation: retired  Tobacco Use   Smoking status: Never   Smokeless tobacco: Never  Vaping Use   Vaping status: Never Used  Substance and Sexual Activity   Alcohol use:  Yes    Alcohol/week: 1.0 standard drink of alcohol    Types: 1 Glasses of wine per week    Comment: 1 every 6 mts    Drug use: No   Sexual activity: Never  Other Topics Concern   Not on file  Social History Narrative   Walks daily. Divorced. Education: [employer].      Drinks 4 cups of caffeine daily.   Social Drivers of Corporate investment banker  Strain: Low Risk  (11/16/2021)   Overall Financial Resource Strain (CARDIA)    Difficulty of Paying Living Expenses: Not hard at all  Food Insecurity: No Food Insecurity (11/16/2021)   Hunger Vital Sign    Worried About Running Out of Food in the Last Year: Never true    Ran Out of Food in the Last Year: Never true  Transportation Needs: No Transportation Needs (11/16/2021)   PRAPARE - Administrator, Civil Service (Medical): No    Lack of Transportation (Non-Medical): No  Physical Activity: Not on file  Stress: Not on file  Social Connections: Not on file  Intimate Partner Violence: Not on file     OBSERVATIONS/OBJECTIVE:  BP 132/76 (BP Location: Left Arm, Patient Position: Sitting)   Pulse 86   Temp 98.3 F (36.8 C) (Temporal)   Resp 16   Wt 162 lb 11.2 oz (73.8 kg)   SpO2 97%   BMI 29.76 kg/m  GENERAL: Patient is a well appearing female in no acute distress HEENT:  Sclerae anicteric.  Oropharynx clear and moist. No ulcerations or evidence of oropharyngeal candidiasis. Neck is supple.  NODES:  No cervical, supraclavicular, or axillary lymphadenopathy palpated.  BREAST EXAM: Breasts are status post lumpectomy and radiation bilaterally.  No sign of local recurrence. LUNGS:  Clear to auscultation bilaterally.  No wheezes or rhonchi. HEART:  Regular rate and rhythm. No murmur appreciated. ABDOMEN:  Soft, nontender.  Positive, normoactive bowel sounds. No organomegaly palpated. MSK:  No focal spinal tenderness to palpation. Full range of motion bilaterally in the upper extremities. EXTREMITIES:  Left behind the knee  swelling SKIN:  Clear with no obvious rashes or skin changes. No nail dyscrasia. NEURO:  Nonfocal. Well oriented.  Appropriate affect.   LABORATORY DATA:  None for this visit.  DIAGNOSTIC IMAGING:  None for this visit.     ASSESSMENT AND PLAN:   Tracey Richard is a pleasant 83 y.o. female with bilateral breast cancer ER+/PR+/HER2-, diagnosed in January 2023, treated with lumpectomy, adjuvant radiation therapy, and anti-estrogen therapy with anastrozole beginning in April 2023.    Frequent Falls Patient reports frequent falls, often due to tripping over her right foot. No significant injuries reported except for a minor cut requiring stitches. Patient has completed physical therapy and has not fallen since. Patient declines prescription for a walker. -Continue use of cane for support. -Monitor for any changes in frequency or severity of falls.  Breast Cancer Patient was taken off Anastrozole due to concerns about elevated calcium levels potentially related to the medication. Calcium levels have since normalized and PTH related peptide is pending. -Resume Anastrozole 1mg  daily. -Check PTH related peptide when results are available. -Follow-up appointment scheduled for March 10th, 2025.  Hypercalcemia Mildly elevated calcium levels noted in the past, potentially related to Vitamin D and Calcium supplementation. Levels have normalized since stopping the supplements. -Continue to withhold Vitamin D and Calcium supplements. -Check PTH related peptide when results are available.  Tremor Patient reports a new tremor, potentially worsened by caffeine intake. -Reduce coffee intake to two cups per day. -Monitor for changes in tremor severity or frequency.  General Health Maintenance -Continue regular mammograms as part of breast cancer follow-up. -Consider reducing caffeine intake to manage tremor. -Follow-up appointment scheduled for March 10th, 2025.

## 2023-12-11 NOTE — Telephone Encounter (Signed)
 LVM and sent mychart msg informing pt of need to reschedule 12/13/23 appt - office closed due to inclement weather

## 2023-12-12 NOTE — Telephone Encounter (Signed)
 Pt rescheduled 06/11/24 at 11:00 am

## 2023-12-13 ENCOUNTER — Ambulatory Visit: Payer: Medicare Other | Admitting: Adult Health

## 2023-12-13 LAB — PTH-RELATED PEPTIDE: PTH-related peptide: <2 pmol/L

## 2023-12-15 ENCOUNTER — Telehealth: Payer: Self-pay | Admitting: Hematology and Oncology

## 2023-12-15 NOTE — Telephone Encounter (Signed)
 Spoke with patient confirming upcoming appointment changes

## 2023-12-17 DIAGNOSIS — M9903 Segmental and somatic dysfunction of lumbar region: Secondary | ICD-10-CM | POA: Diagnosis not present

## 2023-12-17 DIAGNOSIS — M542 Cervicalgia: Secondary | ICD-10-CM | POA: Diagnosis not present

## 2023-12-17 DIAGNOSIS — M545 Low back pain, unspecified: Secondary | ICD-10-CM | POA: Diagnosis not present

## 2023-12-20 ENCOUNTER — Telehealth: Payer: Self-pay | Admitting: *Deleted

## 2023-12-20 NOTE — Telephone Encounter (Signed)
-----   Message from Montague Iruku sent at 12/13/2023  1:08 PM EST ----- Val, PTH related peptide is good, her last calcium is normal. I have an appt with her in March, so I will just see her then.I told her we will call her when PTHrp is back.

## 2023-12-20 NOTE — Telephone Encounter (Signed)
 Pt called

## 2023-12-31 ENCOUNTER — Ambulatory Visit: Payer: Medicare Other | Admitting: Hematology and Oncology

## 2023-12-31 DIAGNOSIS — M545 Low back pain, unspecified: Secondary | ICD-10-CM | POA: Diagnosis not present

## 2023-12-31 DIAGNOSIS — M9903 Segmental and somatic dysfunction of lumbar region: Secondary | ICD-10-CM | POA: Diagnosis not present

## 2023-12-31 DIAGNOSIS — M542 Cervicalgia: Secondary | ICD-10-CM | POA: Diagnosis not present

## 2024-01-01 ENCOUNTER — Telehealth: Payer: Self-pay

## 2024-01-01 NOTE — Telephone Encounter (Signed)
 Spoke with patient and confirmed appointment for 01/02/24

## 2024-01-02 ENCOUNTER — Inpatient Hospital Stay

## 2024-01-02 ENCOUNTER — Inpatient Hospital Stay: Payer: Medicare Other | Attending: Hematology and Oncology | Admitting: Hematology and Oncology

## 2024-01-02 VITALS — BP 137/71 | HR 109 | Temp 99.1°F | Resp 18 | Ht 62.0 in | Wt 160.4 lb

## 2024-01-02 DIAGNOSIS — Z1721 Progesterone receptor positive status: Secondary | ICD-10-CM | POA: Diagnosis not present

## 2024-01-02 DIAGNOSIS — Z1732 Human epidermal growth factor receptor 2 negative status: Secondary | ICD-10-CM | POA: Insufficient documentation

## 2024-01-02 DIAGNOSIS — C50211 Malignant neoplasm of upper-inner quadrant of right female breast: Secondary | ICD-10-CM | POA: Diagnosis not present

## 2024-01-02 DIAGNOSIS — R413 Other amnesia: Secondary | ICD-10-CM | POA: Diagnosis not present

## 2024-01-02 DIAGNOSIS — Z79899 Other long term (current) drug therapy: Secondary | ICD-10-CM | POA: Insufficient documentation

## 2024-01-02 DIAGNOSIS — C50412 Malignant neoplasm of upper-outer quadrant of left female breast: Secondary | ICD-10-CM | POA: Diagnosis not present

## 2024-01-02 DIAGNOSIS — Z79811 Long term (current) use of aromatase inhibitors: Secondary | ICD-10-CM | POA: Insufficient documentation

## 2024-01-02 DIAGNOSIS — Z801 Family history of malignant neoplasm of trachea, bronchus and lung: Secondary | ICD-10-CM | POA: Insufficient documentation

## 2024-01-02 DIAGNOSIS — Z170 Estrogen receptor positive status [ER+]: Secondary | ICD-10-CM | POA: Insufficient documentation

## 2024-01-02 DIAGNOSIS — Z803 Family history of malignant neoplasm of breast: Secondary | ICD-10-CM | POA: Diagnosis not present

## 2024-01-02 LAB — CMP (CANCER CENTER ONLY)
ALT: 13 U/L (ref 0–44)
AST: 14 U/L — ABNORMAL LOW (ref 15–41)
Albumin: 4.1 g/dL (ref 3.5–5.0)
Alkaline Phosphatase: 55 U/L (ref 38–126)
Anion gap: 5 (ref 5–15)
BUN: 16 mg/dL (ref 8–23)
CO2: 30 mmol/L (ref 22–32)
Calcium: 9.3 mg/dL (ref 8.9–10.3)
Chloride: 105 mmol/L (ref 98–111)
Creatinine: 1.02 mg/dL — ABNORMAL HIGH (ref 0.44–1.00)
GFR, Estimated: 56 mL/min — ABNORMAL LOW (ref >60)
Glucose, Bld: 102 mg/dL — ABNORMAL HIGH (ref 70–99)
Potassium: 3.9 mmol/L (ref 3.5–5.1)
Sodium: 140 mmol/L (ref 135–145)
Total Bilirubin: 0.7 mg/dL (ref 0.0–1.2)
Total Protein: 6.5 g/dL (ref 6.5–8.1)
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
## 2024-01-03 DIAGNOSIS — M9903 Segmental and somatic dysfunction of lumbar region: Secondary | ICD-10-CM | POA: Diagnosis not present

## 2024-01-03 DIAGNOSIS — M542 Cervicalgia: Secondary | ICD-10-CM | POA: Diagnosis not present

## 2024-01-03 DIAGNOSIS — M545 Low back pain, unspecified: Secondary | ICD-10-CM | POA: Diagnosis not present

## 2024-01-07 DIAGNOSIS — M9903 Segmental and somatic dysfunction of lumbar region: Secondary | ICD-10-CM | POA: Diagnosis not present

## 2024-01-07 DIAGNOSIS — M542 Cervicalgia: Secondary | ICD-10-CM | POA: Diagnosis not present

## 2024-01-07 DIAGNOSIS — M545 Low back pain, unspecified: Secondary | ICD-10-CM | POA: Diagnosis not present

## 2024-01-10 DIAGNOSIS — M9903 Segmental and somatic dysfunction of lumbar region: Secondary | ICD-10-CM | POA: Diagnosis not present

## 2024-01-10 DIAGNOSIS — M545 Low back pain, unspecified: Secondary | ICD-10-CM | POA: Diagnosis not present

## 2024-01-10 DIAGNOSIS — M542 Cervicalgia: Secondary | ICD-10-CM | POA: Diagnosis not present

## 2024-01-17 DIAGNOSIS — M545 Low back pain, unspecified: Secondary | ICD-10-CM | POA: Diagnosis not present

## 2024-01-17 DIAGNOSIS — M542 Cervicalgia: Secondary | ICD-10-CM | POA: Diagnosis not present

## 2024-01-17 DIAGNOSIS — M9903 Segmental and somatic dysfunction of lumbar region: Secondary | ICD-10-CM | POA: Diagnosis not present

## 2024-01-21 DIAGNOSIS — M542 Cervicalgia: Secondary | ICD-10-CM | POA: Diagnosis not present

## 2024-01-21 DIAGNOSIS — M9903 Segmental and somatic dysfunction of lumbar region: Secondary | ICD-10-CM | POA: Diagnosis not present

## 2024-01-21 DIAGNOSIS — M545 Low back pain, unspecified: Secondary | ICD-10-CM | POA: Diagnosis not present

## 2024-01-24 DIAGNOSIS — M545 Low back pain, unspecified: Secondary | ICD-10-CM | POA: Diagnosis not present

## 2024-01-24 DIAGNOSIS — M9903 Segmental and somatic dysfunction of lumbar region: Secondary | ICD-10-CM | POA: Diagnosis not present

## 2024-01-24 DIAGNOSIS — M542 Cervicalgia: Secondary | ICD-10-CM | POA: Diagnosis not present

## 2024-01-28 DIAGNOSIS — M542 Cervicalgia: Secondary | ICD-10-CM | POA: Diagnosis not present

## 2024-01-28 DIAGNOSIS — M9903 Segmental and somatic dysfunction of lumbar region: Secondary | ICD-10-CM | POA: Diagnosis not present

## 2024-01-28 DIAGNOSIS — M545 Low back pain, unspecified: Secondary | ICD-10-CM | POA: Diagnosis not present

## 2024-01-31 DIAGNOSIS — M9903 Segmental and somatic dysfunction of lumbar region: Secondary | ICD-10-CM | POA: Diagnosis not present

## 2024-01-31 DIAGNOSIS — M545 Low back pain, unspecified: Secondary | ICD-10-CM | POA: Diagnosis not present

## 2024-01-31 DIAGNOSIS — M542 Cervicalgia: Secondary | ICD-10-CM | POA: Diagnosis not present

## 2024-02-04 DIAGNOSIS — M9903 Segmental and somatic dysfunction of lumbar region: Secondary | ICD-10-CM | POA: Diagnosis not present

## 2024-02-04 DIAGNOSIS — M545 Low back pain, unspecified: Secondary | ICD-10-CM | POA: Diagnosis not present

## 2024-02-04 DIAGNOSIS — M542 Cervicalgia: Secondary | ICD-10-CM | POA: Diagnosis not present

## 2024-02-07 DIAGNOSIS — M9903 Segmental and somatic dysfunction of lumbar region: Secondary | ICD-10-CM | POA: Diagnosis not present

## 2024-02-07 DIAGNOSIS — M545 Low back pain, unspecified: Secondary | ICD-10-CM | POA: Diagnosis not present

## 2024-02-07 DIAGNOSIS — M542 Cervicalgia: Secondary | ICD-10-CM | POA: Diagnosis not present

## 2024-02-20 DIAGNOSIS — M542 Cervicalgia: Secondary | ICD-10-CM | POA: Diagnosis not present

## 2024-02-20 DIAGNOSIS — M9903 Segmental and somatic dysfunction of lumbar region: Secondary | ICD-10-CM | POA: Diagnosis not present

## 2024-02-20 DIAGNOSIS — M545 Low back pain, unspecified: Secondary | ICD-10-CM | POA: Diagnosis not present

## 2024-02-25 DIAGNOSIS — M542 Cervicalgia: Secondary | ICD-10-CM | POA: Diagnosis not present

## 2024-02-25 DIAGNOSIS — M545 Low back pain, unspecified: Secondary | ICD-10-CM | POA: Diagnosis not present

## 2024-02-25 DIAGNOSIS — M9903 Segmental and somatic dysfunction of lumbar region: Secondary | ICD-10-CM | POA: Diagnosis not present

## 2024-02-27 DIAGNOSIS — M9903 Segmental and somatic dysfunction of lumbar region: Secondary | ICD-10-CM | POA: Diagnosis not present

## 2024-02-27 DIAGNOSIS — M545 Low back pain, unspecified: Secondary | ICD-10-CM | POA: Diagnosis not present

## 2024-02-27 DIAGNOSIS — M542 Cervicalgia: Secondary | ICD-10-CM | POA: Diagnosis not present

## 2024-02-28 DIAGNOSIS — I872 Venous insufficiency (chronic) (peripheral): Secondary | ICD-10-CM | POA: Diagnosis not present

## 2024-02-28 DIAGNOSIS — L821 Other seborrheic keratosis: Secondary | ICD-10-CM | POA: Diagnosis not present

## 2024-02-28 DIAGNOSIS — Z1283 Encounter for screening for malignant neoplasm of skin: Secondary | ICD-10-CM | POA: Diagnosis not present

## 2024-02-28 DIAGNOSIS — Z85828 Personal history of other malignant neoplasm of skin: Secondary | ICD-10-CM | POA: Diagnosis not present

## 2024-03-06 DIAGNOSIS — M25562 Pain in left knee: Secondary | ICD-10-CM | POA: Diagnosis not present

## 2024-03-06 DIAGNOSIS — M25561 Pain in right knee: Secondary | ICD-10-CM | POA: Diagnosis not present

## 2024-03-22 DIAGNOSIS — I10 Essential (primary) hypertension: Secondary | ICD-10-CM | POA: Diagnosis not present

## 2024-03-22 DIAGNOSIS — E7800 Pure hypercholesterolemia, unspecified: Secondary | ICD-10-CM | POA: Diagnosis not present

## 2024-03-22 DIAGNOSIS — N1831 Chronic kidney disease, stage 3a: Secondary | ICD-10-CM | POA: Diagnosis not present

## 2024-03-22 DIAGNOSIS — C50911 Malignant neoplasm of unspecified site of right female breast: Secondary | ICD-10-CM | POA: Diagnosis not present

## 2024-04-09 DIAGNOSIS — M1712 Unilateral primary osteoarthritis, left knee: Secondary | ICD-10-CM | POA: Diagnosis not present

## 2024-04-14 ENCOUNTER — Other Ambulatory Visit: Payer: Self-pay | Admitting: Adult Health

## 2024-04-15 ENCOUNTER — Encounter: Payer: Self-pay | Admitting: Hematology and Oncology

## 2024-04-15 ENCOUNTER — Inpatient Hospital Stay: Attending: Hematology and Oncology | Admitting: Hematology and Oncology

## 2024-04-15 VITALS — BP 131/75 | HR 73 | Temp 98.5°F | Resp 18 | Wt 159.6 lb

## 2024-04-15 DIAGNOSIS — C50211 Malignant neoplasm of upper-inner quadrant of right female breast: Secondary | ICD-10-CM | POA: Insufficient documentation

## 2024-04-15 DIAGNOSIS — Z801 Family history of malignant neoplasm of trachea, bronchus and lung: Secondary | ICD-10-CM | POA: Diagnosis not present

## 2024-04-15 DIAGNOSIS — Z808 Family history of malignant neoplasm of other organs or systems: Secondary | ICD-10-CM | POA: Diagnosis not present

## 2024-04-15 DIAGNOSIS — Z803 Family history of malignant neoplasm of breast: Secondary | ICD-10-CM | POA: Diagnosis not present

## 2024-04-15 DIAGNOSIS — Z79899 Other long term (current) drug therapy: Secondary | ICD-10-CM | POA: Insufficient documentation

## 2024-04-15 DIAGNOSIS — Z8673 Personal history of transient ischemic attack (TIA), and cerebral infarction without residual deficits: Secondary | ICD-10-CM | POA: Diagnosis not present

## 2024-04-15 DIAGNOSIS — R251 Tremor, unspecified: Secondary | ICD-10-CM | POA: Diagnosis not present

## 2024-04-15 DIAGNOSIS — Z170 Estrogen receptor positive status [ER+]: Secondary | ICD-10-CM | POA: Insufficient documentation

## 2024-04-15 DIAGNOSIS — R413 Other amnesia: Secondary | ICD-10-CM | POA: Insufficient documentation

## 2024-04-15 DIAGNOSIS — C50412 Malignant neoplasm of upper-outer quadrant of left female breast: Secondary | ICD-10-CM | POA: Diagnosis not present

## 2024-04-15 DIAGNOSIS — Z923 Personal history of irradiation: Secondary | ICD-10-CM | POA: Insufficient documentation

## 2024-04-15 DIAGNOSIS — Z79811 Long term (current) use of aromatase inhibitors: Secondary | ICD-10-CM | POA: Diagnosis not present

## 2024-04-15 DIAGNOSIS — Z1721 Progesterone receptor positive status: Secondary | ICD-10-CM | POA: Diagnosis not present

## 2024-04-15 DIAGNOSIS — Z8042 Family history of malignant neoplasm of prostate: Secondary | ICD-10-CM | POA: Insufficient documentation

## 2024-04-15 DIAGNOSIS — R296 Repeated falls: Secondary | ICD-10-CM | POA: Diagnosis not present

## 2024-04-15 DIAGNOSIS — Z1732 Human epidermal growth factor receptor 2 negative status: Secondary | ICD-10-CM | POA: Diagnosis not present
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
## 2024-04-16 NOTE — Assessment & Plan Note (Signed)
 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  Given strong ER/PR positive tumor, low proliferation index, although we discussed about oncotype testing, after much discussion, patient was not interested to proceed with it. She then completed adj radiation and is now on anastrozole .  Assessment and Plan Assessment & Plan Breast cancer History of breast cancer with recent trauma to left breast. Knot likely related to fall, not cancerous. - Order diagnostic mammogram and ultrasound at [facility] - Await imaging results and communicate findings.  Tremor Worsening tremors. She has a neurologist Dr Rosemarie, I strongly recommended she contact Dr Rosemarie about this.  Memory issues Memory issues possibly related to 2016 stroke.  - Discuss memory issues with neurologist Dr. Rosemarie.  Frequent falls Frequent falls due to balance issues, possibly from right foot neuropathy. - Recommend using a walker to improve balance and prevent falls.  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

## 2024-04-21 DIAGNOSIS — N6322 Unspecified lump in the left breast, upper inner quadrant: Secondary | ICD-10-CM | POA: Diagnosis not present

## 2024-04-21 DIAGNOSIS — D242 Benign neoplasm of left breast: Secondary | ICD-10-CM | POA: Diagnosis not present

## 2024-04-21 DIAGNOSIS — C50911 Malignant neoplasm of unspecified site of right female breast: Secondary | ICD-10-CM | POA: Diagnosis not present

## 2024-04-21 DIAGNOSIS — I10 Essential (primary) hypertension: Secondary | ICD-10-CM | POA: Diagnosis not present

## 2024-04-21 DIAGNOSIS — N1831 Chronic kidney disease, stage 3a: Secondary | ICD-10-CM | POA: Diagnosis not present

## 2024-04-21 DIAGNOSIS — E7800 Pure hypercholesterolemia, unspecified: Secondary | ICD-10-CM | POA: Diagnosis not present

## 2024-04-21 DIAGNOSIS — R92332 Mammographic heterogeneous density, left breast: Secondary | ICD-10-CM | POA: Diagnosis not present

## 2024-05-02 DIAGNOSIS — Z8673 Personal history of transient ischemic attack (TIA), and cerebral infarction without residual deficits: Secondary | ICD-10-CM | POA: Diagnosis not present

## 2024-05-02 DIAGNOSIS — G4700 Insomnia, unspecified: Secondary | ICD-10-CM | POA: Diagnosis not present

## 2024-05-02 DIAGNOSIS — R7303 Prediabetes: Secondary | ICD-10-CM | POA: Diagnosis not present

## 2024-05-02 DIAGNOSIS — Z79811 Long term (current) use of aromatase inhibitors: Secondary | ICD-10-CM | POA: Diagnosis not present

## 2024-05-02 DIAGNOSIS — E7800 Pure hypercholesterolemia, unspecified: Secondary | ICD-10-CM | POA: Diagnosis not present

## 2024-05-02 DIAGNOSIS — N1831 Chronic kidney disease, stage 3a: Secondary | ICD-10-CM | POA: Diagnosis not present

## 2024-05-02 DIAGNOSIS — C50911 Malignant neoplasm of unspecified site of right female breast: Secondary | ICD-10-CM | POA: Diagnosis not present

## 2024-05-02 DIAGNOSIS — G250 Essential tremor: Secondary | ICD-10-CM | POA: Diagnosis not present

## 2024-05-02 DIAGNOSIS — R600 Localized edema: Secondary | ICD-10-CM | POA: Diagnosis not present

## 2024-05-02 DIAGNOSIS — E559 Vitamin D deficiency, unspecified: Secondary | ICD-10-CM | POA: Diagnosis not present

## 2024-05-02 DIAGNOSIS — I10 Essential (primary) hypertension: Secondary | ICD-10-CM | POA: Diagnosis not present

## 2024-05-02 DIAGNOSIS — G4730 Sleep apnea, unspecified: Secondary | ICD-10-CM | POA: Diagnosis not present

## 2024-05-02 DIAGNOSIS — M85852 Other specified disorders of bone density and structure, left thigh: Secondary | ICD-10-CM | POA: Diagnosis not present

## 2024-05-07 DIAGNOSIS — Z961 Presence of intraocular lens: Secondary | ICD-10-CM | POA: Diagnosis not present

## 2024-05-07 DIAGNOSIS — H40013 Open angle with borderline findings, low risk, bilateral: Secondary | ICD-10-CM | POA: Diagnosis not present

## 2024-05-07 DIAGNOSIS — H43813 Vitreous degeneration, bilateral: Secondary | ICD-10-CM | POA: Diagnosis not present

## 2024-05-07 DIAGNOSIS — H35371 Puckering of macula, right eye: Secondary | ICD-10-CM | POA: Diagnosis not present

## 2024-05-08 DIAGNOSIS — R600 Localized edema: Secondary | ICD-10-CM | POA: Diagnosis not present

## 2024-05-08 DIAGNOSIS — L03116 Cellulitis of left lower limb: Secondary | ICD-10-CM | POA: Diagnosis not present

## 2024-05-08 DIAGNOSIS — I878 Other specified disorders of veins: Secondary | ICD-10-CM | POA: Diagnosis not present

## 2024-05-13 DIAGNOSIS — R600 Localized edema: Secondary | ICD-10-CM | POA: Diagnosis not present

## 2024-05-19 DIAGNOSIS — I83893 Varicose veins of bilateral lower extremities with other complications: Secondary | ICD-10-CM | POA: Diagnosis not present

## 2024-05-19 DIAGNOSIS — I8312 Varicose veins of left lower extremity with inflammation: Secondary | ICD-10-CM | POA: Diagnosis not present

## 2024-05-19 DIAGNOSIS — I8311 Varicose veins of right lower extremity with inflammation: Secondary | ICD-10-CM | POA: Diagnosis not present

## 2024-05-19 DIAGNOSIS — M79661 Pain in right lower leg: Secondary | ICD-10-CM | POA: Diagnosis not present

## 2024-05-20 ENCOUNTER — Encounter: Payer: Self-pay | Admitting: Hematology and Oncology

## 2024-05-21 DIAGNOSIS — I83892 Varicose veins of left lower extremities with other complications: Secondary | ICD-10-CM | POA: Diagnosis not present

## 2024-05-27 DIAGNOSIS — I83891 Varicose veins of right lower extremities with other complications: Secondary | ICD-10-CM | POA: Diagnosis not present

## 2024-05-27 DIAGNOSIS — I87391 Chronic venous hypertension (idiopathic) with other complications of right lower extremity: Secondary | ICD-10-CM | POA: Diagnosis not present

## 2024-06-03 DIAGNOSIS — I83892 Varicose veins of left lower extremities with other complications: Secondary | ICD-10-CM | POA: Diagnosis not present

## 2024-06-10 DIAGNOSIS — I83891 Varicose veins of right lower extremities with other complications: Secondary | ICD-10-CM | POA: Diagnosis not present

## 2024-06-11 ENCOUNTER — Ambulatory Visit: Payer: Medicare Other | Admitting: Adult Health

## 2024-06-11 ENCOUNTER — Encounter: Payer: Self-pay | Admitting: Adult Health

## 2024-06-11 VITALS — BP 122/76 | HR 88 | Ht 62.0 in | Wt 160.8 lb

## 2024-06-11 DIAGNOSIS — G4733 Obstructive sleep apnea (adult) (pediatric): Secondary | ICD-10-CM | POA: Diagnosis not present

## 2024-06-11 NOTE — Patient Instructions (Signed)
 Continue using CPAP nightly and greater than 4 hours each night Decrease imipramine  to 50 mg at bedtime.  If tolerating well let me know if you tolerate the reduction and will continue to reduce the dose. If your symptoms worsen or you develop new symptoms please let us  know.

## 2024-06-11 NOTE — Progress Notes (Signed)
 PATIENT: Tracey Richard DOB: [birth year]  REASON FOR VISIT: follow up HISTORY FROM: patient PRIMARY NEUROLOGIST: Dr. Chalice  Chief Complaint  Patient presents with   Follow-up    Rm 19, Arthur, not using since last 1-2 yrs.  ESS 11.      HISTORY OF PRESENT ILLNESS: Today 06/11/24:  Tracey Richard is a 83 y.o. female with a history of obstructive sleep apnea on CPAP. Returns today for follow-up.  Reports that she has not been using the machine in the last year.  She states that it started leaking and she stopped using it and has been sleeping fine therefore she never restarted it.  Her last sleep study was in 2019.  The patient has also been on imipramine  for years.  She states that this was originally started by orthopedics for pain and headaches?  She has continued taking 75 mg at bedtime.  She returns today for an evaluation.     12/07/22: Tracey Richard is a 83 y.o. female with a history of OSA on CPAP. Returns today for follow-up. Reports that Most of the time she doesn't go to bed until 3-5 AM she will sleep until around 3 PM. Reports that she prefers to stay up late at night.     12/07/21: Tracey Richard is a 83 year old female with a history of OSA on CPAP. Reports CPAP is working well. Patient had  Breast cancer surgery in February. Will meet will oncologist tomorrow.    HISTORY  12/06/20:   Tracey Richard is a 83 year old female with a history of obstructive sleep apnea on CPAP.  She returns today for follow-up.  She reports that the CPAP is still working well for her.  She denies any new issues.  Returns today for evaluation.   Compliance Report:   Compliance Usage 11/06/2020 - 12/05/2020 Usage days 30/30 days (100%) >= 4 hours 26 days (87%) Average usage (days used) 6 hours 24 minutes   AirSense 10 AutoSet For Her Serial number [id number] Mode AutoSet for Her Min Pressure 5 cmH2O Max Pressure 15 cmH2O EPR Fulltime EPR level 3    Therapy Pressure - cmH2O Median: 10.5 95th percentile: 13.2 Maximum: 13.9 Leaks - L/min Median: 4.0 95th percentile: 17.1 Maximum: 29.9 Events per hour AI: 2.2 HI: 0.4 AHI: 2.6 Apnea Index Central: 0.3 Obstructive: 1.5 Unknown: 0.3    REVIEW OF SYSTEMS: Out of a complete 14 system review of symptoms, the patient complains only of the following symptoms, and all other reviewed systems are negative.   ESS 10  ALLERGIES: Allergies  Allergen Reactions   Elemental Sulfur Other (See Comments)    Childhood    Penicillins Other (See Comments)    Childhood    Oxycodone  Hcl Itching   Tape Itching    HOME MEDICATIONS: Outpatient Medications Prior to Visit  Medication Sig Dispense Refill   acetaminophen  (TYLENOL ) 500 MG tablet Take 500-1,000 mg by mouth every 6 (six) hours as needed (pain.).     anastrozole  (ARIMIDEX ) 1 MG tablet Take 1 tablet (1 mg total) by mouth daily. 90 tablet 3   Ascorbic Acid (VITAMIN C) 1000 MG tablet Take 1,000 mg by mouth daily at 4 PM.     aspirin EC 81 MG tablet Take 81 mg by mouth daily.     Calcium  Carb-Cholecalciferol (CALCIUM  + D3) 600-800 MG-UNIT TABS Take 1 tablet by mouth daily after breakfast.     hydroxypropyl methylcellulose / hypromellose (ISOPTO TEARS / GONIOVISC) 2.5 %  ophthalmic solution Place 1-2 drops into both eyes 3 (three) times daily as needed for dry eyes.     imipramine  (TOFRANIL ) 25 MG tablet TAKE THREE TABLETS BY MOUTH EVERY NIGHT AT BEDTIME 180 tablet 0   losartan (COZAAR) 50 MG tablet Take 50 mg by mouth at bedtime.     polyethylene glycol (MIRALAX / GLYCOLAX) 17 g packet Take 17 g by mouth daily at 4 PM.     simvastatin  (ZOCOR ) 40 MG tablet TAKE 1 TABLET (40 MG TOTAL) BY MOUTH DAILY AT BEDTIME 90 tablet 3   Wheat Dextrin (BENEFIBER DRINK MIX PO) Take 1 Dose by mouth daily at 4 PM. 2 teaspoons     No facility-administered medications prior to visit.    PAST MEDICAL HISTORY: Past Medical History:  Diagnosis Date   Allergy     Cancer (HCC)    Cataract    Chronic kidney disease    GERD (gastroesophageal reflux disease)    Hyperlipidemia    Hypertension    Pre-diabetes    Sleep apnea    Stroke (HCC)    ruptured aneurysm    PAST SURGICAL HISTORY: Past Surgical History:  Procedure Laterality Date   APPENDECTOMY  1963   BACK SURGERY  2007   BREAST LUMPECTOMY WITH RADIOACTIVE SEED LOCALIZATION Bilateral 11/24/2021   Procedure: BILATERAL BREAST LUMPECTOMY WITH RADIOACTIVE SEED LOCALIZATION;  Surgeon: Aron Shoulders, MD;  Location: [facility] OR;  Service: General;  Laterality: Bilateral;   BREAST SURGERY     DILATION AND CURETTAGE OF UTERUS     EYE SURGERY  1999   bilateral cataract extraction   moles  1965   SPINE SURGERY     TONSILLECTOMY  1959    FAMILY HISTORY: Family History  Problem Relation Age of Onset   Vaginal cancer Mother        d. 76   Hypertension Mother    Prostate cancer Father        d. 59   Hypertension Father    Dementia Father    Diabetes Brother    Lung cancer Paternal Grandmother        dx after 6   Breast cancer Other        PGF's sister; dx after 50   Sleep apnea Neg Hx     SOCIAL HISTORY: Social History   Socioeconomic History   Marital status: Single    Spouse name: Not on file   Number of children: Not on file   Years of education: Not on file   Highest education level: Not on file  Occupational History   Occupation: retired  Tobacco Use   Smoking status: Never   Smokeless tobacco: Never  Vaping Use   Vaping status: Never Used  Substance and Sexual Activity   Alcohol use: Yes    Alcohol/week: 1.0 standard drink of alcohol    Types: 1 Glasses of wine per week    Comment: 1 every 6 mts    Drug use: No   Sexual activity: Never  Other Topics Concern   Not on file  Social History Narrative   Walks daily. Divorced. Education: [employer].      Drinks 4 cups of caffeine daily.   Social Drivers of Health   Financial Resource Strain: Low Risk  (11/16/2021)    Overall Financial Resource Strain (CARDIA)    Difficulty of Paying Living Expenses: Not hard at all  Food Insecurity: No Food Insecurity (11/16/2021)   Hunger Vital Sign    Worried  About Running Out of Food in the Last Year: Never true    Ran Out of Food in the Last Year: Never true  Transportation Needs: No Transportation Needs (11/16/2021)   PRAPARE - Administrator, Civil Service (Medical): No    Lack of Transportation (Non-Medical): No  Physical Activity: Not on file  Stress: Not on file  Social Connections: Not on file  Intimate Partner Violence: Not on file      PHYSICAL EXAM  Vitals:   06/11/24 1058  Weight: 160 lb 12.8 oz (72.9 kg)  Height: 5' 2 (1.575 m)   Body mass index is 29.41 kg/m.  Generalized: Well developed, in no acute distress  Chest: Lungs clear to auscultation bilaterally  Neurological examination  Mentation: Alert oriented to time, place, history taking. Follows all commands speech and language fluent Cranial nerve II-XII: Extraocular movements were full, visual field were full on confrontational test Head turning and shoulder shrug  were normal and symmetric. Gait and station: Gait is normal.    DIAGNOSTIC DATA (LABS, IMAGING, TESTING) - I reviewed patient records, labs, notes, testing and imaging myself where available.  Lab Results  Component Value Date   WBC 8.0 11/16/2021   HGB 11.7 (L) 11/16/2021   HCT 35.6 (L) 11/16/2021   MCV 88.6 11/16/2021   PLT 248 11/16/2021      Component Value Date/Time   NA 140 01/02/2024 1551   K 3.9 01/02/2024 1551   CL 105 01/02/2024 1551   CO2 30 01/02/2024 1551   GLUCOSE 102 (H) 01/02/2024 1551   BUN 16 01/02/2024 1551   CREATININE 1.02 (H) 01/02/2024 1551   CREATININE 0.98 (H) 07/03/2016 0835   CALCIUM  9.3 01/02/2024 1551   PROT 6.5 01/02/2024 1551   ALBUMIN 4.1 01/02/2024 1551   AST 14 (L) 01/02/2024 1551   ALT 13 01/02/2024 1551   ALKPHOS 55 01/02/2024 1551   BILITOT 0.7 01/02/2024  1551   GFRNONAA 56 (L) 01/02/2024 1551   GFRNONAA 57 (L) 07/03/2016 0835   GFRAA 55 (L) 05/25/2017 2240   GFRAA 66 07/03/2016 0835   Lab Results  Component Value Date   CHOL 155 07/03/2016   HDL 58 07/03/2016   LDLCALC 82 07/03/2016   TRIG 73 07/03/2016   CHOLHDL 2.7 07/03/2016   Lab Results  Component Value Date   HGBA1C 5.8 (H) 11/22/2021   No results found for: VITAMINB12 Lab Results  Component Value Date   TSH 3.888 09/30/2013      ASSESSMENT AND PLAN 83 y.o. year old adult  has a past medical history of Allergy, Cancer (HCC), Cataract, Chronic kidney disease, GERD (gastroesophageal reflux disease), Hyperlipidemia, Hypertension, Pre-diabetes, Sleep apnea, and Stroke (HCC). here with:  OSA on CPAP  - CPAP compliance suboptimal - Good treatment of AHI  - Encourage patient to use CPAP nightly and > 4 hours each night - Decrease imipramine  to 50 mg at bedtime.  If tolerating well we will continue to reduce the dose. - F/U in 1 year or sooner if needed    Duwaine Russell, MSN, NP-C 06/11/2024, 11:01 AM [facility] [address], [address] [place], [place] [address] [phone]  The patient's condition requires frequent monitoring and adjustments in the treatment plan, reflecting the ongoing complexity of care.  This provider is the continuing focal point for all needed services for this condition.

## 2024-06-16 DIAGNOSIS — M1712 Unilateral primary osteoarthritis, left knee: Secondary | ICD-10-CM | POA: Diagnosis not present

## 2024-06-17 ENCOUNTER — Telehealth: Payer: Self-pay | Admitting: Adult Health

## 2024-06-17 NOTE — Telephone Encounter (Signed)
 HST- [employer] medicare no auth req   Patient is scheduled at [facility] for 07/08/24 at 2:30 pm.  Mailed packet.

## 2024-06-22 DIAGNOSIS — E7800 Pure hypercholesterolemia, unspecified: Secondary | ICD-10-CM | POA: Diagnosis not present

## 2024-06-22 DIAGNOSIS — C50911 Malignant neoplasm of unspecified site of right female breast: Secondary | ICD-10-CM | POA: Diagnosis not present

## 2024-06-22 DIAGNOSIS — I10 Essential (primary) hypertension: Secondary | ICD-10-CM | POA: Diagnosis not present

## 2024-06-22 DIAGNOSIS — N1831 Chronic kidney disease, stage 3a: Secondary | ICD-10-CM | POA: Diagnosis not present

## 2024-06-24 DIAGNOSIS — I83892 Varicose veins of left lower extremities with other complications: Secondary | ICD-10-CM | POA: Diagnosis not present

## 2024-07-02 DIAGNOSIS — I83811 Varicose veins of right lower extremities with pain: Secondary | ICD-10-CM | POA: Diagnosis not present

## 2024-07-02 DIAGNOSIS — I83891 Varicose veins of right lower extremities with other complications: Secondary | ICD-10-CM | POA: Diagnosis not present

## 2024-07-04 ENCOUNTER — Inpatient Hospital Stay: Attending: Hematology and Oncology | Admitting: Hematology and Oncology

## 2024-07-04 VITALS — BP 122/64 | HR 93 | Temp 98.7°F | Resp 16 | Wt 155.8 lb

## 2024-07-04 DIAGNOSIS — M8580 Other specified disorders of bone density and structure, unspecified site: Secondary | ICD-10-CM | POA: Diagnosis not present

## 2024-07-04 DIAGNOSIS — R296 Repeated falls: Secondary | ICD-10-CM | POA: Insufficient documentation

## 2024-07-04 DIAGNOSIS — Z1721 Progesterone receptor positive status: Secondary | ICD-10-CM | POA: Insufficient documentation

## 2024-07-04 DIAGNOSIS — C50211 Malignant neoplasm of upper-inner quadrant of right female breast: Secondary | ICD-10-CM | POA: Diagnosis not present

## 2024-07-04 DIAGNOSIS — Z803 Family history of malignant neoplasm of breast: Secondary | ICD-10-CM | POA: Diagnosis not present

## 2024-07-04 DIAGNOSIS — Z8042 Family history of malignant neoplasm of prostate: Secondary | ICD-10-CM | POA: Insufficient documentation

## 2024-07-04 DIAGNOSIS — Z79899 Other long term (current) drug therapy: Secondary | ICD-10-CM | POA: Insufficient documentation

## 2024-07-04 DIAGNOSIS — C50412 Malignant neoplasm of upper-outer quadrant of left female breast: Secondary | ICD-10-CM | POA: Insufficient documentation

## 2024-07-04 DIAGNOSIS — Z923 Personal history of irradiation: Secondary | ICD-10-CM | POA: Diagnosis not present

## 2024-07-04 DIAGNOSIS — Z808 Family history of malignant neoplasm of other organs or systems: Secondary | ICD-10-CM | POA: Insufficient documentation

## 2024-07-04 DIAGNOSIS — Z1732 Human epidermal growth factor receptor 2 negative status: Secondary | ICD-10-CM | POA: Diagnosis not present

## 2024-07-04 DIAGNOSIS — Z170 Estrogen receptor positive status [ER+]: Secondary | ICD-10-CM | POA: Diagnosis not present

## 2024-07-04 DIAGNOSIS — Z79811 Long term (current) use of aromatase inhibitors: Secondary | ICD-10-CM | POA: Diagnosis not present

## 2024-07-04 DIAGNOSIS — Z801 Family history of malignant neoplasm of trachea, bronchus and lung: Secondary | ICD-10-CM | POA: Insufficient documentation

## 2024-07-04 NOTE — Progress Notes (Signed)
 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   12/01/2021 Genetic Testing   Negative hereditary cancer genetic testing: no pathogenic variants detected in [facility] CustomNext-Cancer +RNAinsight Panel.  The report date is 11/30/2021.   The CustomNext-Cancer+RNAinsight panel offered by [facility] includes sequencing and rearrangement analysis for the following 47 genes:  APC, ATM, AXIN2, BARD1, BMPR1A, BRCA1, BRCA2, BRIP1, CDH1, CDK4, CDKN2A, CHEK2, DICER1, EPCAM, GREM1, HOXB13, MEN1, MLH1, MSH2, MSH3, MSH6, MUTYH, NBN, NF1, NF2, NTHL1, PALB2, PMS2, POLD1, POLE, PTEN, RAD51C, RAD51D, RECQL, RET, SDHA, SDHAF2, SDHB, SDHC, SDHD, SMAD4, SMARCA4, STK11, TP53, TSC1, TSC2, and VHL.  RNA data is routinely analyzed for use in variant interpretation for all genes.   01/02/2022 - 01/27/2022 Radiation Therapy   Site Technique Total Dose (Gy) Dose per Fx (Gy) Completed Fx Beam Energies  Breast, Right: Breast_R 3D 42.56/42.56 2.66 16/16 6X  Breast, Right: Breast_R_Bst 3D 8/8 2 4/4 6X, 10X  Breast, Left: Breast_L 3D 42.56/42.56 2.66 16/16 10X  Breast, Left: Breast_L_Bst 3D 8/8 2 4/4 6X, 10X     01/2022 -  Anti-estrogen oral therapy   Anastrozole  daily   Malignant neoplasm of upper-outer quadrant of left female breast  (HCC)  11/15/2021 Initial Diagnosis   Malignant neoplasm of upper-outer quadrant of left female breast (HCC)   11/16/2021 Cancer Staging   Staging form: Breast, AJCC 8th Edition - Clinical stage from 11/16/2021: cT1a, cN0, cM0, G2, ER+, PR+, HER2: Unknown - Signed by Loretha Ash, MD on 11/16/2021 Stage prefix: Initial diagnosis Method of lymph node assessment: Clinical Histologic grading system: 3 grade system   11/24/2021 Surgery   Right breast lumpectomy showed invasive lobular carcinoma measuring 2.7 cm, mammary carcinoma in situ, all surgical margins negative for carcinoma Invasive carcinoma is 0.3 cm from superior margin prognostics on this tumor showed ER 100% positive, strong staining, PR 100% positive, strong staining, HER2 equivocal with IHC and negative with FISH ratio 1.31, Ki-67 of 5% from the original biopsy. 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   12/01/2021 Genetic Testing   Negative hereditary cancer genetic testing: no pathogenic variants detected in [facility] CustomNext-Cancer +RNAinsight Panel.  The report date is 11/30/2021.   The CustomNext-Cancer+RNAinsight panel offered by [facility] includes sequencing and rearrangement analysis for the following 47 genes:  APC, ATM, AXIN2, BARD1, BMPR1A, BRCA1, BRCA2, BRIP1, CDH1, CDK4,  CDKN2A, CHEK2, DICER1, EPCAM, GREM1, HOXB13, MEN1, MLH1, MSH2, MSH3, MSH6,  MUTYH, NBN, NF1, NF2, NTHL1, PALB2, PMS2, POLD1, POLE, PTEN, RAD51C, RAD51D, RECQL, RET, SDHA, SDHAF2, SDHB, SDHC, SDHD, SMAD4, SMARCA4, STK11, TP53, TSC1, TSC2, and VHL.  RNA data is routinely analyzed for use in variant interpretation for all genes.   01/02/2022 - 01/27/2022 Radiation Therapy   Site Technique Total Dose (Gy) Dose per Fx (Gy) Completed Fx Beam Energies  Breast, Right: Breast_R 3D 42.56/42.56 2.66 16/16 6X  Breast, Right: Breast_R_Bst 3D 8/8 2 4/4 6X, 10X  Breast, Left: Breast_L 3D 42.56/42.56 2.66 16/16 10X  Breast, Left: Breast_L_Bst 3D 8/8 2 4/4 6X, 10X     01/2022 -  Anti-estrogen oral therapy   Anastrozole  daily   05/22/2022 Cancer Staging   Staging form: Breast, AJCC 8th Edition - Pathologic: Stage IA (pT1c, pN0, cM0, G1, ER+, PR+, HER2-) - Signed by Crawford Morna Pickle, NP on 05/22/2022 Histologic grading system: 3 grade system     INTERVAL HISTORY:   Discussed the use of AI scribe software for clinical note transcription with the patient, who gave verbal consent to proceed.  History of Present Illness    Tracey Richard is an 83 year old female with breast cancer who presents for follow-up.  She has experienced increased sensitivity to cold, which she attributes to weight loss. Her weight has decreased from 180 pounds to 155 pounds without intentional efforts.  She continues to have memory issues and is no longer under the care of her previous neurologist. She is now seeing a nurse practitioner and is scheduled for a sleep test on the 16th. Despite sleeping well for 12 to 14 hours a day, her schedule remains busy with frequent medical appointments.  She experiences occasional dizziness, particularly when changing postures quickly. She uses a cane for walking outside but does not use it inside her apartment.  She is taking anastrozole  daily for breast cancer and has a mammogram scheduled for January 19th. She has stopped taking vitamin D  supplements  without a specific reason.  She reports having undergone six procedures for venous insufficiency, with the last one on Thursday.   ONCOLOGY TREATMENT TEAM:  1. Surgeon:  Dr. Aron at [facility] 2. Medical Oncologist: Dr. Loretha  3. Radiation Oncologist: Dr. Dewey    PAST MEDICAL/SURGICAL HISTORY:  Past Medical History:  Diagnosis Date   Allergy    Cancer [facility])    Cataract    Chronic kidney disease    GERD (gastroesophageal reflux disease)    Hyperlipidemia    Hypertension    Pre-diabetes    Sleep apnea    Stroke [facility])    ruptured aneurysm   Past Surgical History:  Procedure Laterality Date   APPENDECTOMY  1963   BACK SURGERY  2007   BREAST LUMPECTOMY WITH RADIOACTIVE SEED LOCALIZATION Bilateral 11/24/2021   Procedure: BILATERAL BREAST LUMPECTOMY WITH RADIOACTIVE SEED LOCALIZATION;  Surgeon: Aron Shoulders, MD;  Location: [facility] OR;  Service: General;  Laterality: Bilateral;   BREAST SURGERY     DILATION AND CURETTAGE OF UTERUS     EYE SURGERY  1999   bilateral cataract extraction   moles  1965   SPINE SURGERY     TONSILLECTOMY  1959     ALLERGIES:  Allergies  Allergen Reactions   Elemental Sulfur Other (See Comments)    Childhood    Penicillins Other (See Comments)    Childhood    Oxycodone  Hcl Itching   Tape Itching  CURRENT MEDICATIONS:  Outpatient Encounter Medications as of 07/04/2024  Medication Sig   acetaminophen  (TYLENOL ) 500 MG tablet Take 500-1,000 mg by mouth every 6 (six) hours as needed (pain.).   anastrozole  (ARIMIDEX ) 1 MG tablet Take 1 tablet (1 mg total) by mouth daily.   aspirin EC 81 MG tablet Take 81 mg by mouth daily.   hydroxypropyl methylcellulose / hypromellose (ISOPTO TEARS / GONIOVISC) 2.5 % ophthalmic solution Place 1-2 drops into both eyes 3 (three) times daily as needed for dry eyes.   imipramine  (TOFRANIL ) 25 MG tablet TAKE THREE TABLETS BY MOUTH EVERY NIGHT AT BEDTIME   losartan (COZAAR) 50 MG tablet Take 50 mg by  mouth at bedtime.   polyethylene glycol (MIRALAX / GLYCOLAX) 17 g packet Take 17 g by mouth daily at 4 PM.   simvastatin  (ZOCOR ) 40 MG tablet TAKE 1 TABLET (40 MG TOTAL) BY MOUTH DAILY AT BEDTIME   Wheat Dextrin (BENEFIBER DRINK MIX PO) Take 1 Dose by mouth daily at 4 PM. 2 teaspoons   Ascorbic Acid (VITAMIN C) 1000 MG tablet Take 1,000 mg by mouth daily at 4 PM. (Patient not taking: Reported on 07/04/2024)   Calcium  Carb-Cholecalciferol (CALCIUM  + D3) 600-800 MG-UNIT TABS Take 1 tablet by mouth daily after breakfast. (Patient not taking: Reported on 07/04/2024)   No facility-administered encounter medications on file as of 07/04/2024.     ONCOLOGIC FAMILY HISTORY:  Family History  Problem Relation Age of Onset   Vaginal cancer Mother        d. 50   Hypertension Mother    Prostate cancer Father        d. 92   Hypertension Father    Dementia Father    Diabetes Brother    Lung cancer Paternal Grandmother        dx after 30   Breast cancer Other        PGF's sister; dx after 50   Sleep apnea Neg Hx     SOCIAL HISTORY:  Social History   Socioeconomic History   Marital status: Single    Spouse name: Not on file   Number of children: Not on file   Years of education: Not on file   Highest education level: Not on file  Occupational History   Occupation: retired  Tobacco Use   Smoking status: Never   Smokeless tobacco: Never  Vaping Use   Vaping status: Never Used  Substance and Sexual Activity   Alcohol use: Yes    Alcohol/week: 1.0 standard drink of alcohol    Types: 1 Glasses of wine per week    Comment: 1 every 6 mts    Drug use: No   Sexual activity: Never  Other Topics Concern   Not on file  Social History Narrative   Walks daily. Divorced. Education: [employer].      Drinks 4 cups of caffeine daily.   Social Drivers of Corporate investment banker Strain: Low Risk  (11/16/2021)   Overall Financial Resource Strain (CARDIA)    Difficulty of Paying Living  Expenses: Not hard at all  Food Insecurity: No Food Insecurity (11/16/2021)   Hunger Vital Sign    Worried About Running Out of Food in the Last Year: Never true    Ran Out of Food in the Last Year: Never true  Transportation Needs: No Transportation Needs (11/16/2021)   PRAPARE - Administrator, Civil Service (Medical): No    Lack of Transportation (Non-Medical): No  Physical Activity: Not on file  Stress: Not on file  Social Connections: Not on file  Intimate Partner Violence: Not on file     OBSERVATIONS/OBJECTIVE:  BP 122/64 (BP Location: Left Arm, Patient Position: Sitting, Cuff Size: Normal)   Pulse 93   Temp 98.7 F (37.1 C) (Temporal)   Resp 16   Wt 155 lb 12.8 oz (70.7 kg)   SpO2 98%   BMI 28.50 kg/m  GENERAL: Patient is a well appearing female in no acute distress Chest: CTA bilaterally No cervical or axillary adenopathy Bilateral breasts examined. No palpable masses. No regional adenopathy NO palpable axillary adenopathy   LABORATORY DATA:  None for this visit.  DIAGNOSTIC IMAGING:  None for this visit.     ASSESSMENT AND PLAN:   Ms.. Brannum is a pleasant 83 y.o. female with bilateral breast cancer ER+/PR+/HER2-, diagnosed in January 2023, treated with lumpectomy, adjuvant radiation therapy, and anti-estrogen therapy with anastrozole  beginning in April 2023.   Assessment & Plan  Breast cancer Breast cancer is well-managed with anastrozole . Recent imaging showed benign fat necrosis, no new masses detected. - Continue anastrozole  therapy. - Scheduled next mammogram for January 19th at [facility].  Osteopenia Recommend weight bearing exercises, vit D supplementation.  Continue follow up with neurology for memory loss.  Amber Stalls MD
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
## 2024-07-08 ENCOUNTER — Ambulatory Visit (INDEPENDENT_AMBULATORY_CARE_PROVIDER_SITE_OTHER): Admitting: Neurology

## 2024-07-08 DIAGNOSIS — R0683 Snoring: Secondary | ICD-10-CM

## 2024-07-08 DIAGNOSIS — G4733 Obstructive sleep apnea (adult) (pediatric): Secondary | ICD-10-CM | POA: Diagnosis not present

## 2024-07-08 DIAGNOSIS — G4710 Hypersomnia, unspecified: Secondary | ICD-10-CM

## 2024-07-10 NOTE — Progress Notes (Signed)
 [facility]  DARCHELLE NUNES 83 year old female 1940/11/12   HOME SLEEP TEST REPORT ( by Watch PAT)   STUDY DATE:   07-08-2024  Data loaded 07-10-2024    ORDERING CLINICIAN: Duwaine Russell, NP    REFERRING CLINICIAN: PCP at [facility]- Dr Seabron, MD    CLINICAL INFORMATION/HISTORY: 06/11/24:   HONI NAME is a 83 y.o. female with a history of obstructive sleep apnea on CPAP. Returns today for follow-up.  Reports that she has not been using the machine in the last year.  She states that mask started leaking and she stopped using it and has been sleeping fine therefore she never restarted it.   Her last sleep study was in 2019.  The patient has also been on imipramine  for years, originally started by orthopedics for pain and headaches.  She has continued taking 75 mg at bedtime.  She returns today for an evaluation. Breast cancer survivor. Aneurysm bleed survivor. GERD, pre diabetes,/  Previously used CPAP ResMed:  95% pressure  13 cm water.  AHI 2.6/h , 5-15 cm water , 3 cm EPR.      Epworth sleepiness score: 11/24. FSS at  X /63 points    BMI:  29 kg/m   Sleep Summary:   Total Recording Time (hours, min): 9 hours 29 minutes      Total Sleep Time (hours, min):   7 hours 29 minutes              Percent REM (%):   13.9%                                     Respiratory Indices: CMS based AHI 7.1/h                             REM pAHI: 3.1/h                                               NREM pAHI:   8/h                           Positional AHI: The patient slept 358 minutes in supine with an AHI of 8.9/h.  Sleep in nonsupine position reached an AHI of 0/h.  Snoring reached a mean volume of 42 dB which is mild.  Snoring was present for over 50% of total recording time.                                                 Oxygen Saturation Statistics:   Oxygen Saturation (%) Mean: 92%             O2 Saturation Range (%):   Between the nadir at 85% and a  maximum saturation of 96%                                    O2 Saturation (minutes) <89%: 4 minutes  Pulse Rate Statistics:   Pulse Mean (bpm):   Mean heart rate was 87 bpm, ranging from 60 through 115 bpm.  Unfortunately, this home sleep test device cannot give additional information about cardiac rhythm.                IMPRESSION:  This HST detected only very mild sleep apnea, and treatment would be optional for patients with an AHI of less than 10/h.  RECOMMENDATION: I do recommend to avoid supine sleep as the positional component was very strong for this patient's sleep apnea.  There is no need for oxygen, CPAP is optional, and by avoiding supine sleep there is likely a further reduction from mild apnea possibly to an AHI of less than 5/h.    INTERPRETING PHYSICIAN:   Dedra Gores, MD

## 2024-07-12 ENCOUNTER — Other Ambulatory Visit: Payer: Self-pay | Admitting: Adult Health

## 2024-07-16 ENCOUNTER — Telehealth: Payer: Self-pay | Admitting: *Deleted

## 2024-07-16 NOTE — Telephone Encounter (Signed)
 AHI below 10/h - mildest sleep apnea. Can be treated by avoiding sleeping in supine and instead sleeping prone or in lateral position.   Dedra Gores, MD

## 2024-07-16 NOTE — Telephone Encounter (Signed)
 Spoke to patient gave sleep study results and Dr Dohmeier's  recommendations Pt expressed understanding and thanked me for calling

## 2024-07-16 NOTE — Procedures (Signed)
 [facility]  Tracey Richard 83 year old female July 21, 1941   HOME SLEEP TEST REPORT ( by Watch PAT)   STUDY DATE:   07-08-2024  Data loaded 07-10-2024    ORDERING CLINICIAN: Duwaine Russell, NP    REFERRING CLINICIAN: PCP at [facility]- Dr Seabron, MD    CLINICAL INFORMATION/HISTORY: 06/11/24:   Tracey Richard is a 83 y.o. female with a history of obstructive sleep apnea on CPAP. Returns today for follow-up.  Reports that she has not been using the machine in the last year.  She states that mask started leaking and she stopped using it and has been sleeping fine therefore she never restarted it.   Her last sleep study was in 2019.  The patient has also been on imipramine  for years, originally started by orthopedics for pain and headaches.  She has continued taking 75 mg at bedtime.  She returns today for an evaluation. Breast cancer survivor. Aneurysm bleed survivor. GERD, pre diabetes,/  Previously used CPAP ResMed:  95% pressure  13 cm water.  AHI 2.6/h , 5-15 cm water , 3 cm EPR.      Epworth sleepiness score: 11/24. FSS at  X /63 points    BMI:  29 kg/m   Sleep Summary:   Total Recording Time (hours, min): 9 hours 29 minutes      Total Sleep Time (hours, min):   7 hours 29 minutes              Percent REM (%):   13.9%                                     Respiratory Indices: CMS based AHI 7.1/h                             REM pAHI: 3.1/h                                               NREM pAHI:   8/h                           Positional AHI: The patient slept 358 minutes in supine with an AHI of 8.9/h.  Sleep in nonsupine position reached an AHI of 0/h.  Snoring reached a mean volume of 42 dB which is mild.  Snoring was present for over 50% of total recording time.                                                 Oxygen Saturation Statistics:   Oxygen Saturation (%) Mean: 92%             O2 Saturation Range (%):   Between the nadir at 85% and a  maximum saturation of 96%                                    O2 Saturation (minutes) <89%: 4 minutes  Pulse Rate Statistics:   Pulse Mean (bpm):   Mean heart rate was 87 bpm, ranging from 60 through 115 bpm.  Unfortunately, this home sleep test device cannot give additional information about cardiac rhythm.                IMPRESSION:  This HST detected only very mild sleep apnea, and treatment would be optional for patients with an AHI of less than 10/h.  RECOMMENDATION: I do recommend to avoid supine sleep as the positional component was very strong for this patient's sleep apnea.  There is no need for oxygen, CPAP is optional, and by avoiding supine sleep there is likely a further reduction from mild apnea possibly to an AHI of less than 5/h.    INTERPRETING PHYSICIAN:   Dedra Gores, MD

## 2024-07-22 DIAGNOSIS — N1831 Chronic kidney disease, stage 3a: Secondary | ICD-10-CM | POA: Diagnosis not present

## 2024-07-22 DIAGNOSIS — E7800 Pure hypercholesterolemia, unspecified: Secondary | ICD-10-CM | POA: Diagnosis not present

## 2024-07-22 DIAGNOSIS — C50911 Malignant neoplasm of unspecified site of right female breast: Secondary | ICD-10-CM | POA: Diagnosis not present

## 2024-07-22 DIAGNOSIS — I10 Essential (primary) hypertension: Secondary | ICD-10-CM | POA: Diagnosis not present

## 2024-08-05 ENCOUNTER — Telehealth: Payer: Self-pay

## 2024-08-05 ENCOUNTER — Other Ambulatory Visit: Payer: Self-pay

## 2024-08-05 MED ORDER — ANASTROZOLE 1 MG PO TABS: 1.0000 mg | ORAL_TABLET | Freq: Every day | ORAL | 3 refills | Status: AC

## 2024-08-05 NOTE — Telephone Encounter (Signed)
 Pt showed up to [facility] requesting to speak with a nurse. S/w pt and she states Arloa Prior let her know they have tried requesting refills on her anastrozole  3X but have not received a response. Advised pt we have not received requests for it, and per MD we refilled today. She verbalized thanks and understanding.

## 2024-11-09 ENCOUNTER — Encounter (HOSPITAL_COMMUNITY): Payer: Self-pay

## 2024-11-09 ENCOUNTER — Emergency Department (HOSPITAL_COMMUNITY)

## 2024-11-09 ENCOUNTER — Emergency Department (HOSPITAL_COMMUNITY)
Admission: EM | Admit: 2024-11-09 | Discharge: 2024-11-09 | Disposition: A | Discharge status: Home / Self Care | Attending: Emergency Medicine | Admitting: Emergency Medicine

## 2024-11-09 ENCOUNTER — Other Ambulatory Visit: Payer: Self-pay

## 2024-11-09 DIAGNOSIS — N189 Chronic kidney disease, unspecified: Secondary | ICD-10-CM | POA: Insufficient documentation

## 2024-11-09 DIAGNOSIS — S6991XA Unspecified injury of right wrist, hand and finger(s), initial encounter: Secondary | ICD-10-CM | POA: Diagnosis present

## 2024-11-09 DIAGNOSIS — S61411A Laceration without foreign body of right hand, initial encounter: Secondary | ICD-10-CM | POA: Insufficient documentation

## 2024-11-09 DIAGNOSIS — Z7982 Long term (current) use of aspirin: Secondary | ICD-10-CM | POA: Diagnosis not present

## 2024-11-09 DIAGNOSIS — S8001XA Contusion of right knee, initial encounter: Secondary | ICD-10-CM | POA: Diagnosis not present

## 2024-11-09 DIAGNOSIS — S8000XA Contusion of unspecified knee, initial encounter: Secondary | ICD-10-CM

## 2024-11-09 DIAGNOSIS — I129 Hypertensive chronic kidney disease with stage 1 through stage 4 chronic kidney disease, or unspecified chronic kidney disease: Secondary | ICD-10-CM | POA: Diagnosis not present

## 2024-11-09 DIAGNOSIS — Z79899 Other long term (current) drug therapy: Secondary | ICD-10-CM | POA: Insufficient documentation

## 2024-11-09 DIAGNOSIS — W19XXXA Unspecified fall, initial encounter: Secondary | ICD-10-CM | POA: Diagnosis not present

## 2024-11-09 DIAGNOSIS — S8002XA Contusion of left knee, initial encounter: Secondary | ICD-10-CM | POA: Insufficient documentation

## 2024-11-09 MED ORDER — LIDOCAINE-EPINEPHRINE (PF) 2 %-1:200000 IJ SOLN
10.0000 mL | Freq: Once | INTRAMUSCULAR | Status: AC
  Administered 2024-11-09: 10 mL
  Filled 2024-11-09: qty 20

## 2024-11-09 NOTE — ED Provider Notes (Signed)
 " [place] EMERGENCY DEPARTMENT AT [facility] Provider Note   CSN: [id number] Arrival date & time: 11/09/24  0457     Patient presents with: Tracey Richard is a 84 y.o. adult.   84 year old female presents to the emergency department for evaluation of right hand pain.  Tracey Richard states that Tracey Richard had a mechanical fall tonight when Tracey Richard got up to use the restroom.  Tracey Richard fell on both of her knees with an outstretched hand which was caught on a plastic trash can.  Laceration noted to the right palm.  Tracey Richard has been ambulatory since the fall without issue.  Declines pain medication in the emergency department.  Tetanus up-to-date.  No history of head trauma or LOC.  The history is provided by the patient. No language interpreter was used.  Fall       Prior to Admission medications  Medication Sig Start Date End Date Taking? Authorizing Provider  acetaminophen  (TYLENOL ) 500 MG tablet Take 500-1,000 mg by mouth every 6 (six) hours as needed (pain.).    [provider]  anastrozole  (ARIMIDEX ) 1 MG tablet Take 1 tablet (1 mg total) by mouth daily. 08/05/24   Iruku, Praveena, MD  Ascorbic Acid (VITAMIN C) 1000 MG tablet Take 1,000 mg by mouth daily at 4 PM. Patient not taking: Reported on 07/04/2024    [provider]  aspirin EC 81 MG tablet Take 81 mg by mouth daily.    [provider]  Calcium  Carb-Cholecalciferol (CALCIUM  + D3) 600-800 MG-UNIT TABS Take 1 tablet by mouth daily after breakfast. Patient not taking: Reported on 07/04/2024    [provider]  hydroxypropyl methylcellulose / hypromellose (ISOPTO TEARS / GONIOVISC) 2.5 % ophthalmic solution Place 1-2 drops into both eyes 3 (three) times daily as needed for dry eyes.    [provider]  imipramine  (TOFRANIL ) 25 MG tablet Take 2 tablets (50 mg total) by mouth at bedtime. 07/16/24   Millikan, Megan, NP  losartan (COZAAR) 50 MG tablet Take 50 mg by mouth at bedtime. 09/26/17    [provider]  polyethylene glycol (MIRALAX / GLYCOLAX) 17 g packet Take 17 g by mouth daily at 4 PM. 05/08/18   [provider]  simvastatin  (ZOCOR ) 40 MG tablet TAKE 1 TABLET (40 MG TOTAL) BY MOUTH DAILY AT BEDTIME 12/16/15   Humberto Elspeth LABOR, MD  Wheat Dextrin (BENEFIBER DRINK MIX PO) Take 1 Dose by mouth daily at 4 PM. 2 teaspoons 05/08/18   [provider]    Allergies: Elemental sulfur, Penicillins, Oxycodone  hcl, and Tape    Review of Systems Ten systems reviewed and are negative for acute change, except as noted in the HPI.    Updated Vital Signs BP (!) 153/89   Pulse 86   Temp 98 F (36.7 C) (Oral)   Resp 15   Ht 5' 2 (1.575 m)   Wt 71.7 kg   SpO2 93%   BMI 28.90 kg/m   Physical Exam Vitals and nursing note reviewed.  Constitutional:      General: Tracey Richard is not in acute distress.    Appearance: Tracey Richard is well-developed. Tracey Richard is not diaphoretic.     Comments: Nontoxic-appearing and in no distress  HENT:     Head: Normocephalic and atraumatic.  Eyes:     General: No scleral icterus.    Conjunctiva/sclera: Conjunctivae normal.  Cardiovascular:     Rate and Rhythm: Normal rate and regular rhythm.     Pulses:  Normal pulses.     Comments: 2+ distal radial pulse right upper extremity Pulmonary:     Effort: Pulmonary effort is normal. No respiratory distress.  Musculoskeletal:        General: Normal range of motion.     Cervical back: Normal range of motion.     Comments: 3 cm semicircular laceration to the thenar eminence of the right hand.  Full range of motion of all digits.  No foreign body noted.  There is also a small elliptical laceration on the palmar aspect of the right hand at the second MCP.  No bleeding noted from either wound.  Skin:    General: Skin is warm and dry.     Coloration: Skin is not pale.     Findings: No erythema or rash.  Neurological:     Mental Status: Tracey Richard is alert and oriented to person, place, and time.  Psychiatric:         Behavior: Behavior normal.     (all labs ordered are listed, but only abnormal results are displayed) Labs Reviewed - No data to display  EKG: None  Radiology: DG Knee Complete 4 Views Left Result Date: 11/09/2024 EXAM: 4 OR MORE VIEW(S) XRAY OF THE LEFT KNEE 11/09/2024 06:08:00 AM COMPARISON: Left knee series dated 05/25/2017. CLINICAL HISTORY: fall FINDINGS: BONES AND JOINTS: No acute fracture. No malalignment. There is mild-to-moderate osteoarthritis, preferentially involving the lateral knee compartment. There is mild patellar spurring. No significant joint effusion. SOFT TISSUES: There is prepatellar soft tissue swelling. IMPRESSION: 1. No evidence of acute traumatic injury. 2. Mild-to-moderate osteoarthritis. 3. Prepatellar soft tissue swelling. Electronically signed by: Evalene Coho MD 11/09/2024 06:52 AM EST RP Workstation: [id number]   DG Knee Complete 4 Views Right Result Date: 11/09/2024 EXAM: 4 OR MORE VIEW(S) Xray of the right knee 11/09/2024 06:08:00 AM COMPARISON: None available. CLINICAL HISTORY: Fall FINDINGS: BONES AND JOINTS: No acute fracture. No malalignment. No significant joint effusion. SOFT TISSUES: Unremarkable. IMPRESSION: 1. No significant abnormality. Electronically signed by: Evalene Coho MD 11/09/2024 06:50 AM EST RP Workstation: [id number]   DG Hand Complete Right Result Date: 11/09/2024 EXAM: 3 or more view(s) Xray of the right hand 11/09/2024 06:08:00 AM COMPARISON: None available. CLINICAL HISTORY: Laceration FINDINGS: BONES AND JOINTS: No acute fracture. No malalignment. SOFT TISSUES: Unremarkable. IMPRESSION: 1. No significant abnormality. Electronically signed by: Evalene Coho MD 11/09/2024 06:29 AM EST RP Workstation: [id number]     .Laceration Repair  Date/Time: 11/09/2024 8:28 AM  Performed by: Keith Sor, PA-C Authorized by: Keith Sor, PA-C   Consent:    Consent obtained:  Verbal and emergent situation   Consent given by:   Patient   Risks, benefits, and alternatives were discussed: yes     Risks discussed:  Infection, need for additional repair, poor cosmetic result, pain and poor wound healing   Alternatives discussed:  No treatment Universal protocol:    Relevant documents present and verified: yes     Test results available: yes     Imaging studies available: yes     Required blood products, implants, devices, and special equipment available: yes     Site/side marked: yes     Patient identity confirmed:  Verbally with patient and arm band Anesthesia:    Anesthesia method:  Local infiltration   Local anesthetic:  Lidocaine  2% WITH epi Laceration details:    Location:  Hand   Hand location:  R palm   Length (cm):  3 Pre-procedure details:    Preparation:  Patient was prepped and draped in usual sterile fashion and imaging obtained to evaluate for foreign bodies Exploration:    Imaging obtained: x-ray     Imaging outcome: foreign body not noted   Treatment:    Area cleansed with:  Povidone-iodine   Amount of cleaning:  Standard   Irrigation solution:  Tap water   Irrigation volume:  50cc   Irrigation method:  Pressure wash Skin repair:    Repair method:  Sutures   Suture size:  4-0   Suture material:  Prolene   Suture technique:  Simple interrupted   Number of sutures:  4 Approximation:    Approximation:  Close Repair type:    Repair type:  Simple Post-procedure details:    Dressing:  Non-adherent dressing   Procedure completion:  Tolerated well, no immediate complications .Laceration Repair  Date/Time: 11/09/2024 8:30 AM  Performed by: Keith Sor, PA-C Authorized by: Keith Sor, PA-C   Consent:    Consent obtained:  Verbal and emergent situation   Consent given by:  Patient   Risks discussed:  Infection, poor cosmetic result, poor wound healing, pain and retained foreign body   Alternatives discussed:  No treatment Universal protocol:    Procedure explained and questions  answered to patient or proxy's satisfaction: yes     Relevant documents present and verified: yes     Test results available: yes     Imaging studies available: yes     Required blood products, implants, devices, and special equipment available: yes     Site/side marked: yes     Immediately prior to procedure, a time out was called: yes     Patient identity confirmed:  Verbally with patient and arm band Anesthesia:    Anesthesia method:  None Laceration details:    Location:  Hand   Hand location:  R palm   Length (cm):  1 Pre-procedure details:    Preparation:  Patient was prepped and draped in usual sterile fashion Exploration:    Imaging obtained: x-ray     Imaging outcome: foreign body not noted   Treatment:    Amount of cleaning:  Standard Skin repair:    Repair method:  Tissue adhesive Approximation:    Approximation:  Close Repair type:    Repair type:  Simple Post-procedure details:    Dressing:  Open (no dressing)    Medications Ordered in the ED  lidocaine -EPINEPHrine  (XYLOCAINE  W/EPI) 2 %-1:200000 (PF) injection 10 mL (10 mLs Infiltration Given by Other 11/09/24 0814)    Clinical Course as of 11/09/24 0828  Sun Nov 09, 2024  0740 Tdap last given 09/08/23. [KH]    Clinical Course User Index [KH] Keith Sor, PA-C                                 Medical Decision Making Amount and/or Complexity of Data Reviewed Radiology: ordered.  Risk Prescription drug management.   This patient presents to the ED for concern of pain 2/2 fall, this involves an extensive number of treatment options, and is a complaint that carries with it a high risk of complications and morbidity.  The differential diagnosis includes closed fracture vs open fracture vs joint dislocation vs contusion vs compartment syndrome    Co morbidities that complicate the patient evaluation  HLD HTN Cerebral aneurysm CKD   Additional history obtained:  Additional history obtained from  friend, at bedside External records from outside  source obtained and reviewed including prior discharge summaries.   Imaging Studies ordered:  I ordered imaging studies including Xray R hand, R knee, L knee  I independently visualized and interpreted imaging which showed no acute pathology; no fracture or joint dislocation I agree with the radiologist interpretation   Cardiac Monitoring:  The patient was maintained on a cardiac monitor.  I personally viewed and interpreted the cardiac monitored which showed an underlying rhythm of: NSR   Medicines ordered and prescription drug management:  I have reviewed the patients home medicines and have made adjustments as needed   Test Considered:  CT knee - felt low yield; preserved ROM, ambulatory   Problem List / ED Course:  Patient presents to the emergency department for evaluation of b/l knee pain and R hand pain after a mechanical fall. No head trauma or LOC. Not on anticoagulants.  Patient neurovascularly intact on exam. Imaging negative for fracture, dislocation, bony deformity. Compartments in the affected extremity are soft.  Tdap booster UTD. Laceration occurred < 12 hours prior to repair which was well tolerated. Discussed suture home care with pt and answered questions.  Plan for supportive management including RICE and Tylenol ; primary care follow up in 10-12 days for suture removal.    Reevaluation:  After the interventions noted above, I reevaluated the patient and found that they have :stayed the same   Social Determinants of Health:  Lives independently   Dispostion:  After consideration of the diagnostic results and the patients response to treatment, I feel that the patent would benefit from outpatient supportive care. PCP f/u in 10-12 days for suture removal. Return precautions discussed and provided. Patient discharged in stable condition with no unaddressed concerns.      Final diagnoses:  Laceration  of right hand without foreign body, initial encounter  Contusion of knee, unspecified laterality, initial encounter    ED Discharge Orders     None          Keith Sor, PA-C 11/09/24 [id number]    Franklyn Sid SAILOR, MD 11/09/24 1023  "

## 2024-11-09 NOTE — ED Triage Notes (Signed)
 Right hand pain after a fall tonight. Says she got up to use the restroom and fell. Approx 3-4 cm laceration on right palm. Small subcentimeter laceration above it.

## 2024-11-09 NOTE — Discharge Instructions (Signed)
 Avoid soaking your wound in stagnant or dirty water such as while taking a bath. You can shower normally. Keep the area clean with mild soap and warm water. Do not apply peroxide or alcohol to your wound as this can break down newly forming skin and prolong wound healing. If you keep the area bandaged, change the dressing/bandage at least once per day. Take Tylenol  for pain as needed. Have your staples/sutures removed in 10-12 days.

## 2024-11-09 NOTE — ED Notes (Signed)
 Patient Alert and oriented to baseline. Stable and ambulatory to baseline. Patient verbalized understanding of the discharge instructions.  Patient belongings were taken by the patient.

## 2025-01-02 ENCOUNTER — Inpatient Hospital Stay: Admitting: Hematology and Oncology

## 2025-02-17 ENCOUNTER — Institutional Professional Consult (permissible substitution): Admitting: Neurology

## 2025-02-23 ENCOUNTER — Ambulatory Visit: Admitting: Adult Health
